# Patient Record
Sex: Female | Born: 1935 | Race: White | Hispanic: No | Smoking: Former smoker
Health system: Southern US, Community
[De-identification: ages and names within clinical notes are randomized; demographics above are authoritative.]

## PROBLEM LIST (undated history)

## (undated) DIAGNOSIS — G8929 Other chronic pain: Secondary | ICD-10-CM

## (undated) DIAGNOSIS — M069 Rheumatoid arthritis, unspecified: Secondary | ICD-10-CM

## (undated) DIAGNOSIS — I951 Orthostatic hypotension: Secondary | ICD-10-CM

## (undated) DIAGNOSIS — I341 Nonrheumatic mitral (valve) prolapse: Secondary | ICD-10-CM

## (undated) DIAGNOSIS — M545 Low back pain, unspecified: Secondary | ICD-10-CM

## (undated) DIAGNOSIS — J449 Chronic obstructive pulmonary disease, unspecified: Secondary | ICD-10-CM

## (undated) DIAGNOSIS — I4891 Unspecified atrial fibrillation: Secondary | ICD-10-CM

## (undated) DIAGNOSIS — C801 Malignant (primary) neoplasm, unspecified: Secondary | ICD-10-CM

## (undated) DIAGNOSIS — R42 Dizziness and giddiness: Secondary | ICD-10-CM

## (undated) DIAGNOSIS — M1990 Unspecified osteoarthritis, unspecified site: Secondary | ICD-10-CM

## (undated) DIAGNOSIS — B029 Zoster without complications: Secondary | ICD-10-CM

## (undated) DIAGNOSIS — E039 Hypothyroidism, unspecified: Secondary | ICD-10-CM

## (undated) DIAGNOSIS — K5790 Diverticulosis of intestine, part unspecified, without perforation or abscess without bleeding: Secondary | ICD-10-CM

## (undated) DIAGNOSIS — K5792 Diverticulitis of intestine, part unspecified, without perforation or abscess without bleeding: Secondary | ICD-10-CM

## (undated) DIAGNOSIS — K219 Gastro-esophageal reflux disease without esophagitis: Secondary | ICD-10-CM

## (undated) DIAGNOSIS — K449 Diaphragmatic hernia without obstruction or gangrene: Secondary | ICD-10-CM

## (undated) HISTORY — DX: Diaphragmatic hernia without obstruction or gangrene: K44.9

## (undated) HISTORY — PX: BACK SURGERY: SHX140

## (undated) HISTORY — DX: Diverticulitis of intestine, part unspecified, without perforation or abscess without bleeding: K57.92

## (undated) HISTORY — PX: TONSILLECTOMY: SUR1361

## (undated) HISTORY — PX: FIXATION KYPHOPLASTY THORACIC SPINE: SHX1643

## (undated) HISTORY — PX: TENDON REPAIR: SHX5111

## (undated) HISTORY — PX: SKIN CANCER EXCISION: SHX779

## (undated) HISTORY — DX: Nonrheumatic mitral (valve) prolapse: I34.1

## (undated) HISTORY — DX: Chronic obstructive pulmonary disease, unspecified: J44.9

---

## 1998-07-02 ENCOUNTER — Encounter: Payer: Self-pay | Admitting: Internal Medicine

## 1998-07-02 ENCOUNTER — Ambulatory Visit (HOSPITAL_COMMUNITY): Admission: RE | Admit: 1998-07-02 | Discharge: 1998-07-02 | Payer: Self-pay | Admitting: Internal Medicine

## 1998-09-19 ENCOUNTER — Other Ambulatory Visit: Admission: RE | Admit: 1998-09-19 | Discharge: 1998-09-19 | Payer: Self-pay | Admitting: Obstetrics and Gynecology

## 1999-07-06 ENCOUNTER — Ambulatory Visit (HOSPITAL_COMMUNITY): Admission: RE | Admit: 1999-07-06 | Discharge: 1999-07-06 | Payer: Self-pay | Admitting: Internal Medicine

## 1999-11-26 ENCOUNTER — Other Ambulatory Visit: Admission: RE | Admit: 1999-11-26 | Discharge: 1999-11-26 | Payer: Self-pay | Admitting: Internal Medicine

## 2000-07-06 ENCOUNTER — Ambulatory Visit (HOSPITAL_COMMUNITY): Admission: RE | Admit: 2000-07-06 | Discharge: 2000-07-06 | Payer: Self-pay | Admitting: Internal Medicine

## 2000-07-06 ENCOUNTER — Encounter: Payer: Self-pay | Admitting: Internal Medicine

## 2000-12-05 ENCOUNTER — Other Ambulatory Visit: Admission: RE | Admit: 2000-12-05 | Discharge: 2000-12-05 | Payer: Self-pay | Admitting: Internal Medicine

## 2001-07-10 ENCOUNTER — Encounter: Payer: Self-pay | Admitting: Internal Medicine

## 2001-07-10 ENCOUNTER — Ambulatory Visit (HOSPITAL_COMMUNITY): Admission: RE | Admit: 2001-07-10 | Discharge: 2001-07-10 | Payer: Self-pay | Admitting: Internal Medicine

## 2001-12-15 ENCOUNTER — Other Ambulatory Visit: Admission: RE | Admit: 2001-12-15 | Discharge: 2001-12-15 | Payer: Self-pay | Admitting: Internal Medicine

## 2009-05-17 ENCOUNTER — Ambulatory Visit: Payer: Self-pay | Admitting: Family Medicine

## 2009-05-17 DIAGNOSIS — R109 Unspecified abdominal pain: Secondary | ICD-10-CM

## 2009-05-17 LAB — CONVERTED CEMR LAB
Bilirubin Urine: NEGATIVE
Glucose, Urine, Semiquant: NEGATIVE
Ketones, urine, test strip: NEGATIVE
Nitrite: NEGATIVE
Protein, U semiquant: NEGATIVE
Specific Gravity, Urine: 1.01
Urobilinogen, UA: 0.2
pH: 7

## 2009-05-20 ENCOUNTER — Ambulatory Visit: Payer: Self-pay | Admitting: Family Medicine

## 2009-05-20 DIAGNOSIS — E039 Hypothyroidism, unspecified: Secondary | ICD-10-CM

## 2009-05-20 LAB — CONVERTED CEMR LAB
Bilirubin Urine: NEGATIVE
Glucose, Urine, Semiquant: NEGATIVE
Nitrite: NEGATIVE
Protein, U semiquant: NEGATIVE
Specific Gravity, Urine: 1.02
Urobilinogen, UA: 0.2
WBC Urine, dipstick: NEGATIVE
pH: 6

## 2009-05-21 ENCOUNTER — Encounter: Payer: Self-pay | Admitting: Family Medicine

## 2009-05-21 LAB — CONVERTED CEMR LAB
Albumin: 4.6 g/dL (ref 3.5–5.2)
Alkaline Phosphatase: 82 units/L (ref 39–117)
BUN: 14 mg/dL (ref 6–23)
Basophils Absolute: 0 10*3/uL (ref 0.0–0.1)
CA 125: 15.6 units/mL (ref 0.0–30.2)
CO2: 25 meq/L (ref 19–32)
Calcium: 10 mg/dL (ref 8.4–10.5)
Chloride: 97 meq/L (ref 96–112)
Eosinophils Relative: 1 % (ref 0–5)
Glucose, Bld: 95 mg/dL (ref 70–99)
HCT: 39.8 % (ref 36.0–46.0)
Hemoglobin: 12.8 g/dL (ref 12.0–15.0)
Lymphocytes Relative: 32 % (ref 12–46)
MCHC: 32.2 g/dL (ref 30.0–36.0)
Monocytes Absolute: 1 10*3/uL (ref 0.1–1.0)
Monocytes Relative: 11 % (ref 3–12)
Potassium: 4.4 meq/L (ref 3.5–5.3)
RDW: 13.8 % (ref 11.5–15.5)
TSH: 2.592 microintl units/mL (ref 0.350–4.500)

## 2009-05-22 ENCOUNTER — Encounter: Admission: RE | Admit: 2009-05-22 | Discharge: 2009-05-22 | Payer: Self-pay | Admitting: Family Medicine

## 2009-05-22 ENCOUNTER — Telehealth: Payer: Self-pay | Admitting: Family Medicine

## 2009-05-22 DIAGNOSIS — M069 Rheumatoid arthritis, unspecified: Secondary | ICD-10-CM

## 2009-06-10 ENCOUNTER — Encounter: Payer: Self-pay | Admitting: Family Medicine

## 2009-06-24 ENCOUNTER — Encounter: Payer: Self-pay | Admitting: Family Medicine

## 2009-07-23 ENCOUNTER — Encounter: Payer: Self-pay | Admitting: Family Medicine

## 2009-09-03 ENCOUNTER — Encounter: Payer: Self-pay | Admitting: Family Medicine

## 2009-11-03 ENCOUNTER — Encounter: Payer: Self-pay | Admitting: Family Medicine

## 2010-04-01 ENCOUNTER — Encounter: Payer: Self-pay | Admitting: Family Medicine

## 2010-06-03 ENCOUNTER — Ambulatory Visit: Payer: Self-pay | Admitting: Family Medicine

## 2010-06-04 ENCOUNTER — Encounter: Payer: Self-pay | Admitting: Family Medicine

## 2010-06-05 ENCOUNTER — Encounter: Payer: Self-pay | Admitting: Family Medicine

## 2010-06-09 ENCOUNTER — Telehealth (INDEPENDENT_AMBULATORY_CARE_PROVIDER_SITE_OTHER): Payer: Self-pay | Admitting: *Deleted

## 2010-06-09 ENCOUNTER — Encounter: Payer: Self-pay | Admitting: Family Medicine

## 2010-06-09 LAB — CONVERTED CEMR LAB
ALT: 19 units/L (ref 0–35)
Albumin: 4.2 g/dL (ref 3.5–5.2)
CO2: 25 meq/L (ref 19–32)
Chloride: 98 meq/L (ref 96–112)
Cholesterol: 236 mg/dL — ABNORMAL HIGH (ref 0–200)
Folate: 17.2 ng/mL
Glucose, Bld: 87 mg/dL (ref 70–99)
Platelets: 333 10*3/uL (ref 150–400)
Potassium: 4.2 meq/L (ref 3.5–5.3)
RBC: 3.79 M/uL — ABNORMAL LOW (ref 3.87–5.11)
Sodium: 137 meq/L (ref 135–145)
Total Protein: 6.8 g/dL (ref 6.0–8.3)
Triglycerides: 122 mg/dL (ref <150)
Vitamin B-12: 476 pg/mL (ref 211–911)
WBC: 11 10*3/uL — ABNORMAL HIGH (ref 4.0–10.5)

## 2010-06-11 ENCOUNTER — Ambulatory Visit: Payer: Self-pay | Admitting: Family Medicine

## 2010-06-11 DIAGNOSIS — C4490 Unspecified malignant neoplasm of skin, unspecified: Secondary | ICD-10-CM | POA: Insufficient documentation

## 2010-06-17 ENCOUNTER — Encounter: Payer: Self-pay | Admitting: Family Medicine

## 2010-06-18 ENCOUNTER — Ambulatory Visit: Payer: Self-pay | Admitting: Family Medicine

## 2010-06-19 ENCOUNTER — Encounter: Payer: Self-pay | Admitting: Family Medicine

## 2010-06-25 ENCOUNTER — Ambulatory Visit: Payer: Self-pay | Admitting: Family Medicine

## 2010-07-02 ENCOUNTER — Ambulatory Visit: Payer: Self-pay | Admitting: Family Medicine

## 2010-07-09 ENCOUNTER — Ambulatory Visit: Payer: Self-pay | Admitting: Family Medicine

## 2010-07-16 ENCOUNTER — Ambulatory Visit: Payer: Self-pay | Admitting: Family Medicine

## 2010-07-23 ENCOUNTER — Ambulatory Visit: Payer: Self-pay | Admitting: Family Medicine

## 2010-07-29 ENCOUNTER — Encounter: Payer: Self-pay | Admitting: Family Medicine

## 2010-08-06 ENCOUNTER — Ambulatory Visit: Payer: Self-pay | Admitting: Family Medicine

## 2010-08-06 ENCOUNTER — Telehealth: Payer: Self-pay | Admitting: Family Medicine

## 2010-08-13 ENCOUNTER — Ambulatory Visit: Payer: Self-pay | Admitting: Family Medicine

## 2010-08-20 ENCOUNTER — Ambulatory Visit: Payer: Self-pay | Admitting: Family Medicine

## 2010-08-27 ENCOUNTER — Ambulatory Visit: Payer: Self-pay | Admitting: Family Medicine

## 2010-09-03 ENCOUNTER — Ambulatory Visit: Payer: Self-pay | Admitting: Family Medicine

## 2010-09-10 ENCOUNTER — Ambulatory Visit
Admission: RE | Admit: 2010-09-10 | Discharge: 2010-09-10 | Payer: Self-pay | Source: Home / Self Care | Attending: Family Medicine | Admitting: Family Medicine

## 2010-09-17 ENCOUNTER — Ambulatory Visit
Admission: RE | Admit: 2010-09-17 | Discharge: 2010-09-17 | Payer: Self-pay | Source: Home / Self Care | Attending: Family Medicine | Admitting: Family Medicine

## 2010-09-24 ENCOUNTER — Ambulatory Visit
Admission: RE | Admit: 2010-09-24 | Discharge: 2010-09-24 | Payer: Self-pay | Source: Home / Self Care | Attending: Family Medicine | Admitting: Family Medicine

## 2010-10-01 ENCOUNTER — Ambulatory Visit
Admission: RE | Admit: 2010-10-01 | Discharge: 2010-10-01 | Payer: Self-pay | Source: Home / Self Care | Attending: Family Medicine | Admitting: Family Medicine

## 2010-10-08 ENCOUNTER — Ambulatory Visit: Payer: 59

## 2010-10-08 NOTE — Progress Notes (Signed)
----   Converted from flag ---- ---- 06/08/2010 5:11 PM, Nani Gasser MD wrote: Call lab and see if they can add a B12 and folate level. ------------------------------  06/08/10 called  lab and added the additional test.acm

## 2010-10-08 NOTE — Assessment & Plan Note (Signed)
Summary: nurse  Nurse Visit   Allergies: No Known Drug Allergies  Medication Administration  Injection # 1:    Medication: methotrexate    Diagnosis: RHEUMATOID ARTHRITIS (ICD-714.0)    Route: SQ    Site: left arm    Patient tolerated injection without complications    Given by: Avon Gully CMA, Duncan Dull) (August 13, 2010 11:26 AM)  Orders Added: 1)  Admin of patients own med IM/SQ [phone]

## 2010-10-08 NOTE — Assessment & Plan Note (Signed)
Summary: Nurse  Nurse Visit   Allergies: No Known Drug Allergies  Immunization History:  Influenza Immunization History:    Influenza:  historical (06/30/2010)  Medication Administration  Injection # 1:    Medication: Methotrexate    Diagnosis: RHEUMATOID ARTHRITIS (ICD-714.0)    Route: SQ    Site: Rarm    Comments: .6cc given in rt arm. pt brings her own meds    Patient tolerated injection without complications    Given by: Avon Gully CMA, Duncan Dull) (July 02, 2010 10:06 AM)  Orders Added: 1)  Admin of patients own med IM/SQ [phone]

## 2010-10-08 NOTE — Assessment & Plan Note (Signed)
Summary: Injection - jr  Nurse Visit   Allergies: No Known Drug Allergies  Medication Administration  Injection # 1:    Medication: methotrexate    Diagnosis: RHEUMATOID ARTHRITIS (ICD-714.0)    Route: IM    Site: left arm    Patient tolerated injection without complications    Given by: Avon Gully CMA, Duncan Dull) (September 24, 2010 1:10 PM)  Orders Added: 1)  Admin of patients own med IM/SQ [phone]

## 2010-10-08 NOTE — Assessment & Plan Note (Signed)
Summary: nurse  Nurse Visit   Allergies: No Known Drug Allergies  Medication Administration  Injection # 1:    Medication: methotrexate    Route: SQ    Patient tolerated injection without complications    Given by: Avon Gully CMA, Duncan Dull) (September 17, 2010 5:13 PM)  Orders Added: 1)  Admin of patients own med IM/SQ [phone]

## 2010-10-08 NOTE — Assessment & Plan Note (Signed)
Summary: SHOT MTX  Nurse Visit   Allergies: No Known Drug Allergies  Medication Administration  Injection # 1:    Medication: methotrexate    Diagnosis: RHEUMATOID ARTHRITIS (ICD-714.0)    Route: SQ    Site: rt arm    Comments: .6cc given    Patient tolerated injection without complications    Given by: Avon Gully CMA, Duncan Dull) (July 23, 2010 10:28 AM)  Orders Added: 1)  Admin of patients own med IM/SQ [phone]

## 2010-10-08 NOTE — Assessment & Plan Note (Signed)
Summary: biopsy leg, MTX injection   Vital Signs:  Patient profile:   77 year old female Height:      64 inches Weight:      104 pounds Pulse rate:   88 / minute BP sitting:   127 / 58  (left arm) Cuff size:   regular  Vitals Entered By: Avon Gully CMA, Duncan Dull) (June 11, 2010 1:10 PM) CC: BX on the leg   Primary Care Provider:  Nani Gasser, MD  CC:  BX on the leg.  History of Present Illness: Lesion has been there for years.    Current Medications (verified): 1)  Levothyroxine Sodium 50 Mcg Tabs (Levothyroxine Sodium) .... Take 1 Tablet By Mouth Once A Day 2)  Fentanyl 12 Mcg/hr Pt72 (Fentanyl) .... Every 72 Hours 3)  Tylenol 500mg  .... 1000mg  4 Times A Day 4)  Alendronate Sodium 70 Mg Tabs (Alendronate Sodium) .... Take One Tablet Once A Week 5)  Folic Acid 1 Mg Tabs (Folic Acid) .... Take One Tablet By Mouth Not On The Same Day As The Methotrexate 6)  Prednisone 5 Mg Tabs (Prednisone) .... Take One Tablet By Mouth Once A Day 7)  Methotrexate 2.5 Mg Tabs (Methotrexate Sodium) .... 6 Tablets  At The Same Time Once A Week 8)  Famotidine 20 Mg Tabs (Famotidine) .... Take One Tablet By Mouth Once A Day 9)  Alendronate Sodium 70 Mg Tabs (Alendronate Sodium) .... One By Mouth Once A Week  Allergies (verified): No Known Drug Allergies  Comments:  Nurse/Medical Assistant: The patient's medications and allergies were reviewed with the patient and were updated in the Medication and Allergy Lists. Avon Gully CMA, Duncan Dull) (June 11, 2010 1:15 PM)  Physical Exam  General:  Well-developed,well-nourished,in no acute distress; alert,appropriate and cooperative throughout examination   Impression & Recommendations:  Problem # 1:  RHEUMATOID ARTHRITIS (ICD-714.0)  Spoke with Dr. Dareen Piano ( her rheum) and we will start the MTX injections weekly here in our office since she is unable to give them to herself.  Her updated medication list for this problem  includes:    Prednisone 5 Mg Tabs (Prednisone) .[name]... Take one tablet by mouth once a day    Methotrexate 2.5 Mg Tabs (Methotrexate sodium) .Marland KitchenMarland KitchenMarland KitchenMarland Kitchen 6 tablets  at the same time once a week    Methotrexate Sodium 50 Mg/85ml Soln (Methotrexate sodium) ..... Inject 0.19ml [place] once a week.  Orders: Admin of patients own med IM/SQ ([id number])  Problem # 2:  CARCINOMA, BASAL CELL (ICD-173.9)  Bx sent for confirmation. F/U wound care reviewed.   Orders: Shave Skin Lesion 0.6-1.0 cm/trunk/arm/leg (11301)  Complete Medication List: 1)  Levothyroxine Sodium 50 Mcg Tabs (Levothyroxine sodium) .... Take 1 tablet by mouth once a day 2)  Fentanyl 12 Mcg/hr Pt72 (Fentanyl) .... Every 72 hours 3)  Tylenol 500mg   .... 1000mg  4 times a day 4)  Alendronate Sodium 70 Mg Tabs (Alendronate sodium) .... Take one tablet once a week 5)  Folic Acid 1 Mg Tabs (Folic acid) .... Take one tablet by mouth not on the same day as the methotrexate 6)  Prednisone 5 Mg Tabs (Prednisone) .... Take one tablet by mouth once a day 7)  Methotrexate 2.5 Mg Tabs (Methotrexate sodium) .... 6 tablets  at the same time once a week 8)  Famotidine 20 Mg Tabs (Famotidine) .... Take one tablet by mouth once a day 9)  Alendronate Sodium 70 Mg Tabs (Alendronate sodium) .... One by mouth once a  week 10)  Methotrexate Sodium 50 Mg/34ml Soln (Methotrexate sodium) .... Inject 0.13ml [place] once a week.  Patient Instructions: 1)  F/U in one week to see the nurse for the methotrexate injection. Stop the oral tabs.   2)  For the wound. Can wash in the shower. Avoid peroxide or alcohol. Apply vaseline daily and cover with bandaid until healed.   Procedure Note  Mole Biopsy/Removal: Onset of lesion: years  Indication: suspicious lesion  Procedure # 1: shave biopsy    Size (in cm): 0.9 x 0.9    Region: Left anterior thigh    Comment: Aluminum chloride used to acheive hemostasis.     Anesthesia: 1% lidocaine w/epinephrine  Cleaned and prepped with:  alcohol, sterile H20, and betadine Wound dressing: bacitracin and bandaid  Biopsy: Biopsy Type: Skin  Procedure # 1: shave biopsy    Location: left thigh    Comment: lot # [id number]  exp 11/12    Instrument used: 3mm punch    Anesthesia: 2.0 ml 1% lidocaine w/epinephrine     Medication Administration  Injection # 1:    Medication: methotrexate    Diagnosis: RHEUMATOID ARTHRITIS (ICD-714.0)    Route: SQ    Site: left arm    Comments: pt brough her own medication    Patient tolerated injection without complications    Given by: Avon Gully CMA, Duncan Dull) (June 11, 2010 1:47 PM)  Orders Added: 1)  Admin of patients own med IM/SQ [[id number]] 2)  Shave Skin Lesion 0.6-1.0 cm/trunk/arm/leg [11301]

## 2010-10-08 NOTE — Assessment & Plan Note (Signed)
Summary: injection-jr  Nurse Visit   Allergies: No Known Drug Allergies  Medication Administration  Injection # 1:    Medication: Methotrezate    Diagnosis: RHEUMATOID ARTHRITIS (ICD-714.0)    Comments: Pt bringes her own meds    Patient tolerated injection without complications    Given by: Avon Gully CMA, Duncan Dull) (June 25, 2010 10:39 AM)  Orders Added: 1)  Admin of patients own med IM/SQ [phone]

## 2010-10-08 NOTE — Assessment & Plan Note (Signed)
Summary: [facility] Annual Wellness   Vital Signs:  Patient profile:   77 year old female Height:      64 inches Weight:      103 pounds BMI:     17.74 Pulse rate:   69 / minute BP sitting:   122 / 61  (left arm) Cuff size:   regular  Vitals Entered By: Avon Gully CMA, Duncan Dull) (June 03, 2010 9:47 AM) CC: annual wellness   Primary Care Provider:  Nani Gasser, MD  CC:  annual wellness.  History of Present Illness: See scanned form.   Current Medications (verified): 1)  Levothroid 25 Mcg Tabs (Levothyroxine Sodium) .... Take One Tablet By Mouth Once A Day 2)  Fentanyl 12 Mcg/hr Pt72 (Fentanyl) .... Every 72 Hours 3)  Tylenol 500mg  .... 1000mg  4 Times A Day 4)  Alendronate Sodium 70 Mg Tabs (Alendronate Sodium) .... Take One Tablet Once A Week 5)  Folic Acid 1 Mg Tabs (Folic Acid) .... Take One Tablet By Mouth Not On The Same Day As The Methotrexate 6)  Prednisone 5 Mg Tabs (Prednisone) .... Take One Tablet By Mouth Once A Day 7)  Methotrexate 2.5 Mg Tabs (Methotrexate Sodium) .... 6 Tablets  At The Same Time Once A Week 8)  Famotidine 20 Mg Tabs (Famotidine) .... Take One Tablet By Mouth Once A Day  Allergies (verified): No Known Drug Allergies  Comments:  Nurse/Medical Assistant: The patient's medications and allergies were reviewed with the patient and were updated in the Medication and Allergy Lists. Avon Gully CMA, Duncan Dull) (June 03, 2010 9:52 AM)  Contraindications/Deferment of Procedures/Staging:    Test/Procedure: FLU VAX    Reason for deferment: patient declined   Past History:  Past Medical History: arthritis mitral valve prolapse hiatal hernia diverticulitis copd Dr. Dareen Piano- rhuematology  Physical Exam  General:  Well-developed,well-nourished,in no acute distress; alert,appropriate and cooperative throughout examination. thin frail appearing female. Head:  Normocephalic and atraumatic without obvious abnormalities. No apparent  alopecia or balding. Eyes:  No corneal or conjunctival inflammation noted. EOMI. Perrla. Ears:  External ear exam shows no significant lesions or deformities.  Otoscopic examination reveals clear canals, tympanic membranes are intact bilaterally without bulging, retraction, inflammation or discharge. Hearing is grossly normal bilaterally. Nose:  External nasal examination shows no deformity or inflammation.  Mouth:  Oral mucosa and oropharynx without lesions or exudates.  Teeth in good repair. Neck:  No deformities, masses, or tenderness noted. Chest Wall:  No deformities, masses, or tenderness noted. Breasts:  No mass, nodules, thickening, tenderness, bulging, retraction, inflamation, nipple discharge or skin changes noted.   Lungs:  Normal respiratory effort, chest expands symmetrically. Lungs are clear to auscultation, no crackles or wheezes. Heart:  Normal rate and regular rhythm. S1 and S2 normal without gallop, murmur, click, rub or other extra sounds. Abdomen:  Bowel sounds positive,abdomen soft and non-tender without masses, organomegaly or hernias noted. Msk:  deformed hand joints.  Pulses:  R and L carotid,radial,dorsalis pedis and posterior tibial pulses are full and equal bilaterally Extremities:  No clubbing, cyanosis, edema, or deformity noted with normal full range of motion of all joints.   Neurologic:  No cranial nerve deficits noted. Station and gait are normal. DTRs are symmetrical throughout. Sensory, motor and coordinative functions appear intact. Skin:  no rashes.   Cervical Nodes:  No lymphadenopathy noted Axillary Nodes:  No palpable lymphadenopathy Psych:  Cognition and judgment appear intact. Alert and cooperative with normal attention span and concentration. No apparent delusions, illusions,  hallucinations   Impression & Recommendations:  Problem # 1:  HEALTH MAINTENANCE EXAM (ICD-V70.0)  Orders: [facility] -Subsequent Annual Wellness Visit [phone])  Complete Medication  List: 1)  Levothroid 25 Mcg Tabs (Levothyroxine sodium) .... Take one tablet by mouth once a day 2)  Fentanyl 12 Mcg/hr Pt72 (Fentanyl) .... Every 72 hours 3)  Tylenol 500mg   .... 1000mg  4 times a day 4)  Alendronate Sodium 70 Mg Tabs (Alendronate sodium) .... Take one tablet once a week 5)  Folic Acid 1 Mg Tabs (Folic acid) .... Take one tablet by mouth not on the same day as the methotrexate 6)  Prednisone 5 Mg Tabs (Prednisone) .... Take one tablet by mouth once a day 7)  Methotrexate 2.5 Mg Tabs (Methotrexate sodium) .... 6 tablets  at the same time once a week 8)  Famotidine 20 Mg Tabs (Famotidine) .... Take one tablet by mouth once a day 9)  Alendronate Sodium 70 Mg Tabs (Alendronate sodium) .... One by mouth once a week  Other Orders: T-TSH [phone]) T-Comprehensive Metabolic Panel [phone]) T-Lipid Profile [phone]) T-CBC No Diff ([id number])  Patient Instructions: 1)  Make sure to get your mammogram this fall 2)  Let me know when you get your flu shot this year. 3)  We will call you with your lab results.   Herpes [place]oster Result Date:  02/04/2010 Herpes Zoster Result:  given Flex Sig Next Due:  Not Indicated Colonoscopy Next Due:  Not Indicated Hemoccult Next Due:  Not Indicated PAP Next Due:  Not Indicated Mammogram Next Due:  Not Indicated

## 2010-10-08 NOTE — Consult Note (Signed)
Summary: [facility]   Imported By: Lanelle Bal 10/01/2010 09:22:21  _____________________________________________________________________  External Attachment:    Type:   Image     Comment:   External Document

## 2010-10-08 NOTE — Letter (Signed)
Summary: [facility]   Imported By: Maryln Gottron 08/13/2010 15:23:24  _____________________________________________________________________  External Attachment:    Type:   Image     Comment:   External Document

## 2010-10-08 NOTE — Assessment & Plan Note (Signed)
Summary: METHOTREXATE INJ  Nurse Visit   Allergies: No Known Drug Allergies  Medication Administration  Medication # 1:    Medication: Methotrexate    Diagnosis: RHEUMATOID ARTHRITIS (ICD-714.0)    Comments: .6cc given in rt arm subq;pt brough her own meds    Patient tolerated medication without complications    Given by: Avon Gully CMA, Duncan Dull) (June 18, 2010 11:04 AM)  Orders Added: 1)  Admin of patients own med IM/SQ [phone]

## 2010-10-08 NOTE — Letter (Signed)
Summary: Cardiovascular Screening/[facility]  Cardiovascular Screening/[facility]   Imported By: Lanelle Bal 06/24/2010 14:17:14  _____________________________________________________________________  External Attachment:    Type:   Image     Comment:   External Document

## 2010-10-08 NOTE — Assessment & Plan Note (Signed)
Summary: nursE  Nurse Visit   Vitals Entered By: Avon Gully CMA, Duncan Dull) (August 20, 2010 11:39 AM)  Allergies: No Known Drug Allergies  Medication Administration  Injection # 1:    Medication: methotrexate    Diagnosis: RHEUMATOID ARTHRITIS (ICD-714.0)    Route: IM    Site: left arm    Patient tolerated injection without complications    Given by: Avon Gully CMA, Duncan Dull) (August 20, 2010 11:40 AM)  Orders Added: 1)  Admin of patients own med IM/SQ [phone]

## 2010-10-08 NOTE — Letter (Signed)
Summary: [facility]   Imported By: Lanelle Bal 11/25/2009 10:10:31  _____________________________________________________________________  External Attachment:    Type:   Image     Comment:   External Document  Appended Document: [facility]

## 2010-10-08 NOTE — Assessment & Plan Note (Signed)
Summary: nurse- jr  Nurse Visit   Allergies: No Known Drug Allergies  Medication Administration  Injection # 1:    Medication: methotrexate    Diagnosis: RHEUMATOID ARTHRITIS (ICD-714.0)    Route: SQ    Site: left arm    Patient tolerated injection without complications    Given by: Avon Gully CMA, Duncan Dull) (September 03, 2010 2:11 PM)  Orders Added: 1)  Admin of patients own med IM/SQ [phone]

## 2010-10-08 NOTE — Assessment & Plan Note (Signed)
Summary: NURSE VISIT-VEW  Nurse Visit   Allergies: No Known Drug Allergies  Medication Administration  Injection # 1:    Medication: Methotrexate    Diagnosis: RHEUMATOID ARTHRITIS (ICD-714.0)    Route: SQ    Site: left arm    Comments: .6 cc given; pt brings her own medication    Patient tolerated injection without complications    Given by: Avon Gully CMA, Duncan Dull) (July 09, 2010 10:05 AM)  Orders Added: 1)  Admin of patients own med IM/SQ [phone]

## 2010-10-08 NOTE — Assessment & Plan Note (Signed)
Summary: Nurse- jr  Nurse Visit   Allergies: No Known Drug Allergies  Medication Administration  Injection # 1:    Medication: Methotrexate    Diagnosis: RHEUMATOID ARTHRITIS (ICD-714.0)    Route: SQ    Site: left arm    Comments: o.51ml injected     Patient tolerated injection without complications    Given by: Kathlene November LPN (August 27, 2010 2:05 PM)  Orders Added: 1)  Admin of patients own med IM/SQ [phone]

## 2010-10-08 NOTE — Assessment & Plan Note (Signed)
Summary: injection  Nurse Visit   Allergies: No Known Drug Allergies  Medication Administration  Injection # 1:    Medication: Methotrexate    Diagnosis: RHEUMATOID ARTHRITIS (ICD-714.0)    Route: SQ    Site: rt arm    Comments: .6 cc given    Patient tolerated injection without complications    Given by: Avon Gully CMA, Duncan Dull) (July 16, 2010 1:53 PM)  Orders Added: 1)  Admin of patients own med IM/SQ [phone]

## 2010-10-08 NOTE — Miscellaneous (Signed)
Summary: mammogram  Clinical Lists Changes  Observations: Added new observation of MAMMOGRAM: Normal (06/19/2010 10:35) Added new observation of MAMMO DUE: 06/2011 (06/19/2010 10:35)    Normal Mammogram. Return for follow up Mammogram in 1 yearCall pt: Normal Mammogram. Return for follow up Mammogram in 1 year October 14, 201110:37 AM [place] MD, Gae Bon CMA, Duncan Dull), Andrea 10:41 AM June 19, 2010 left message for pt on vm

## 2010-10-08 NOTE — Letter (Signed)
Summary: [facility]   Imported By: Lanelle Bal 09/12/2009 09:23:57  _____________________________________________________________________  External Attachment:    Type:   Image     Comment:   External Document

## 2010-10-08 NOTE — Letter (Signed)
Summary: [facility]   Imported By: Lanelle Bal 06/19/2010 11:57:18  _____________________________________________________________________  External Attachment:    Type:   Image     Comment:   External Document

## 2010-10-08 NOTE — Assessment & Plan Note (Signed)
Summary: NURSE VISIT  Nurse Visit   Primary Care Belicia Difatta:  Nani Gasser, MD   History of Present Illness: patient is here today because she is confused about her medications.  She thought she was started on a cholesterol medication and in fact we had adjusted her dose of her thyroid medication.  She thought she was coming back in to have her cholesterol rechecked.  It turns out she has been taking Synthroid 25 micrograms in the morning appropriately and the new medication for levothyroxine 50 micrograms around 5 p.m. in the evening.   Impression & Recommendations:  Problem # 1:  UNSPECIFIED HYPOTHYROIDISM (ICD-244.9) we discussed to stop the evening dose.  And stopped the morning Synthroid.  To start using levothyroxine 50 micrograms once in the morning only.  She does have slightly elevated cholesterol but at 86 and with her level we will avoid statins at this point.  I asked that she take the 50-mcg dose for about 3 to 4 weeks before we recheck her level.  If we were to recheck her level today he will clearly be abnormal as she's been taking a total of 75 micrograms daily. Her updated medication list for this problem includes:    Levothyroxine Sodium 50 Mcg Tabs (Levothyroxine sodium) .[name]... Take 1 tablet by mouth once a day  Complete Medication List: 1)  Levothyroxine Sodium 50 Mcg Tabs (Levothyroxine sodium) .... Take 1 tablet by mouth once a day 2)  Fentanyl 12 Mcg/hr Pt72 (Fentanyl) .... Every 72 hours 3)  Tylenol 500mg   .... 1000mg  4 times a day 4)  Alendronate Sodium 70 Mg Tabs (Alendronate sodium) .... Take one tablet once a week 5)  Folic Acid 1 Mg Tabs (Folic acid) .... Take one tablet by mouth not on the same day as the methotrexate 6)  Prednisone 5 Mg Tabs (Prednisone) .... Take one tablet by mouth once a day 7)  Famotidine 20 Mg Tabs (Famotidine) .... Take one tablet by mouth once a day 8)  Alendronate Sodium 70 Mg Tabs (Alendronate sodium) .... One by mouth once a  week 9)  Methotrexate Sodium 50 Mg/42ml Soln (Methotrexate sodium) .... Inject 0.61ml [place] once a week.  Other Orders: Admin of patients own med IM/SQ [phone])   Patient Instructions: 1)  Only take the thyroid medicine in the morning (levothyroxine) about 1 hour before breakfast on an empty stomach.    Allergies: No Known Drug Allergies  Medication Administration  Injection # 1:    Medication: methotrexate    Diagnosis: RHEUMATOID ARTHRITIS (ICD-714.0)    Route: SQ    Site: left arm    Comments: .6cc given    Patient tolerated injection without complications    Given by: Avon Gully CMA, Duncan Dull) (August 06, 2010 12:00 PM)  Orders Added: 1)  Admin of patients own med IM/SQ [[id number]] 2)  Est. Patient Level I [id number]

## 2010-10-08 NOTE — Assessment & Plan Note (Signed)
Summary: Injection   Nurse Visit   Allergies: No Known Drug Allergies  Medication Administration  Injection # 1:    Medication: methotrexate    Diagnosis: RHEUMATOID ARTHRITIS (ICD-714.0)    Route: SQ    Site: left arm    Patient tolerated injection without complications    Given by: Avon Gully CMA, Duncan Dull) (September 10, 2010 11:24 AM)  Orders Added: 1)  Admin of patients own med IM/SQ [phone]

## 2010-10-14 NOTE — Assessment & Plan Note (Signed)
Summary: Injection  Nurse Visit   Allergies: No Known Drug Allergies  Medication Administration  Injection # 1:    Medication: methotrexate    Diagnosis: RHEUMATOID ARTHRITIS (ICD-714.0)    Route: SQ    Site: rt arm    Patient tolerated injection without complications    Given by: Avon Gully CMA, Duncan Dull) (October 01, 2010 1:04 PM)  Orders Added: 1)  Admin of patients own med IM/SQ [phone]

## 2010-10-15 ENCOUNTER — Encounter: Payer: Self-pay | Admitting: Family Medicine

## 2010-10-15 ENCOUNTER — Ambulatory Visit (INDEPENDENT_AMBULATORY_CARE_PROVIDER_SITE_OTHER): Payer: Medicare Other

## 2010-10-15 DIAGNOSIS — M069 Rheumatoid arthritis, unspecified: Secondary | ICD-10-CM

## 2010-10-22 ENCOUNTER — Encounter: Payer: Self-pay | Admitting: Family Medicine

## 2010-10-22 ENCOUNTER — Ambulatory Visit (INDEPENDENT_AMBULATORY_CARE_PROVIDER_SITE_OTHER): Payer: Medicare Other

## 2010-10-22 DIAGNOSIS — M069 Rheumatoid arthritis, unspecified: Secondary | ICD-10-CM

## 2010-10-22 NOTE — Assessment & Plan Note (Signed)
Summary: nurse - jr  Nurse Visit   Allergies: No Known Drug Allergies  Medication Administration  Injection # 1:    Medication: methotrexate    Diagnosis: RHEUMATOID ARTHRITIS (ICD-714.0)    Route: IM    Site: left arm    Patient tolerated injection without complications    Given by: Avon Gully CMA, Duncan Dull) (October 15, 2010 11:16 AM)  Orders Added: 1)  Admin of patients own med IM/SQ [phone]

## 2010-10-28 NOTE — Assessment & Plan Note (Signed)
Summary: injection   Allergies: No Known Drug Allergies   Complete Medication List: 1)  Levothyroxine Sodium 50 Mcg Tabs (Levothyroxine sodium) .... Take 1 tablet by mouth once a day 2)  Fentanyl 12 Mcg/hr Pt72 (Fentanyl) .... Every 72 hours 3)  Tylenol 500mg   .... 1000mg  4 times a day 4)  Alendronate Sodium 70 Mg Tabs (Alendronate sodium) .... Take one tablet once a week 5)  Folic Acid 1 Mg Tabs (Folic acid) .... Take one tablet by mouth not on the same day as the methotrexate 6)  Prednisone 5 Mg Tabs (Prednisone) .... Take one tablet by mouth once a day 7)  Famotidine 20 Mg Tabs (Famotidine) .... Take one tablet by mouth once a day 8)  Alendronate Sodium 70 Mg Tabs (Alendronate sodium) .... One by mouth once a week 9)  Methotrexate Sodium 50 Mg/34ml Soln (Methotrexate sodium) .... Inject 0.58ml [place] once a week.  Other Orders: Admin of patients own med IM/SQ ([id number])   Medication Administration  Injection # 1:    Medication: methotrexate    Diagnosis: RHEUMATOID ARTHRITIS (ICD-714.0)    Route: SQ    Site: rt arm    Patient tolerated injection without complications    Given by: Avon Gully CMA, Duncan Dull) (October 22, 2010 11:13 AM)  Orders Added: 1)  Admin of patients own med IM/SQ [phone]

## 2010-10-29 ENCOUNTER — Encounter: Payer: Self-pay | Admitting: Family Medicine

## 2010-10-29 ENCOUNTER — Ambulatory Visit (INDEPENDENT_AMBULATORY_CARE_PROVIDER_SITE_OTHER): Payer: Medicare Other

## 2010-10-29 DIAGNOSIS — M069 Rheumatoid arthritis, unspecified: Secondary | ICD-10-CM

## 2010-11-03 NOTE — Assessment & Plan Note (Signed)
Summary: nurse- jr  Nurse Visit   Allergies: No Known Drug Allergies  Medication Administration  Injection # 1:    Medication: Methotrexate    Route: SQ    Site: left arm    Patient tolerated injection without complications    Given by: Avon Gully CMA, Duncan Dull) (October 29, 2010 12:00 PM)  Orders Added: 1)  Admin of patients own med IM/SQ [phone]

## 2010-11-05 ENCOUNTER — Encounter: Payer: Self-pay | Admitting: Family Medicine

## 2010-11-05 ENCOUNTER — Ambulatory Visit (INDEPENDENT_AMBULATORY_CARE_PROVIDER_SITE_OTHER): Payer: Medicare Other

## 2010-11-05 DIAGNOSIS — Z0000 Encounter for general adult medical examination without abnormal findings: Secondary | ICD-10-CM

## 2010-11-12 ENCOUNTER — Ambulatory Visit (INDEPENDENT_AMBULATORY_CARE_PROVIDER_SITE_OTHER): Payer: Medicare Other

## 2010-11-12 ENCOUNTER — Encounter: Payer: Self-pay | Admitting: Family Medicine

## 2010-11-12 DIAGNOSIS — M069 Rheumatoid arthritis, unspecified: Secondary | ICD-10-CM

## 2010-11-12 NOTE — Assessment & Plan Note (Signed)
Summary: shot-vew  Nurse Visit   Allergies: No Known Drug Allergies  Medication Administration  Injection # 1:    Medication: methotrexate    Diagnosis: RHEUMATOID ARTHRITIS (ICD-714.0)    Route: IM    Patient tolerated injection without complications    Given by: Avon Gully CMA, Duncan Dull) (November 05, 2010 11:57 AM)  Orders Added: 1)  Admin of patients own med IM/SQ [phone]

## 2010-11-17 NOTE — Assessment & Plan Note (Signed)
Summary: injection   Allergies: No Known Drug Allergies   Complete Medication List: 1)  Levothyroxine Sodium 50 Mcg Tabs (Levothyroxine sodium) .... Take 1 tablet by mouth once a day 2)  Fentanyl 12 Mcg/hr Pt72 (Fentanyl) .... Every 72 hours 3)  Tylenol 500mg   .... 1000mg  4 times a day 4)  Alendronate Sodium 70 Mg Tabs (Alendronate sodium) .... Take one tablet once a week 5)  Folic Acid 1 Mg Tabs (Folic acid) .... Take one tablet by mouth not on the same day as the methotrexate 6)  Prednisone 5 Mg Tabs (Prednisone) .... Take one tablet by mouth once a day 7)  Famotidine 20 Mg Tabs (Famotidine) .... Take one tablet by mouth once a day 8)  Alendronate Sodium 70 Mg Tabs (Alendronate sodium) .... One by mouth once a week 9)  Methotrexate Sodium 50 Mg/82ml Soln (Methotrexate sodium) .... Inject 0.55ml Shellman once a week.  Other Orders: Admin of patients own med IM/SQ ([id number])   Medication Administration  Injection # 1:    Medication: methotrexate    Diagnosis: RHEUMATOID ARTHRITIS (ICD-714.0)    Route: SQ    Site: rt arm    Patient tolerated injection without complications    Given by: Avon Gully CMA, Duncan Dull) (November 12, 2010 11:35 AM)  Orders Added: 1)  Admin of patients own med IM/SQ [phone]

## 2010-11-19 ENCOUNTER — Ambulatory Visit (INDEPENDENT_AMBULATORY_CARE_PROVIDER_SITE_OTHER): Payer: Medicare Other

## 2010-11-19 ENCOUNTER — Encounter: Payer: Self-pay | Admitting: Family Medicine

## 2010-11-19 DIAGNOSIS — M069 Rheumatoid arthritis, unspecified: Secondary | ICD-10-CM

## 2010-11-24 NOTE — Assessment & Plan Note (Signed)
Summary: nurse visit  Nurse Visit   Allergies: No Known Drug Allergies  Medication Administration  Injection # 1:    Medication: methotrexate    Diagnosis: RHEUMATOID ARTHRITIS (ICD-714.0)    Route: SQ    Site: left arm    Patient tolerated injection without complications    Given by: Avon Gully CMA, Duncan Dull) (November 19, 2010 11:30 AM)  Orders Added: 1)  Admin of patients own med IM/SQ [phone]

## 2010-11-26 ENCOUNTER — Ambulatory Visit: Payer: Medicare Other

## 2010-11-26 ENCOUNTER — Other Ambulatory Visit (INDEPENDENT_AMBULATORY_CARE_PROVIDER_SITE_OTHER): Payer: Medicare Other | Admitting: Family Medicine

## 2010-11-26 DIAGNOSIS — E039 Hypothyroidism, unspecified: Secondary | ICD-10-CM

## 2010-11-26 NOTE — Telephone Encounter (Signed)
Pt states she feels her synthroid is too high. Pt states she doesn't feel well and her heart is racing.Pt states she is taking 50 mcg of Synthroid and felt better on 25 mcg. Please advise

## 2010-11-27 NOTE — Telephone Encounter (Signed)
OK will drop back to . Then recheck thyroid in 2-3 moths. Let me know what pharm.

## 2010-11-30 ENCOUNTER — Telehealth: Payer: Self-pay | Admitting: Family Medicine

## 2010-11-30 NOTE — Telephone Encounter (Signed)
Patient called and wants to know if Dr. Linford Arnold would lower her Thyroid medicine to 25mg  instead of the 50mg  of Levothroxine and call it into [facility] in [facility] so they will deliver it to her? [facility] Phone number is [phone]... Call patient back at [phone] and let her know the status on this please.Lauren KitchenMarland KitchenThanks, Michaelle Copas

## 2010-12-01 NOTE — Telephone Encounter (Signed)
Called pt and left vm with dr. Linford Arnold reccomendations and to call and let us know what pharm

## 2010-12-03 ENCOUNTER — Ambulatory Visit (INDEPENDENT_AMBULATORY_CARE_PROVIDER_SITE_OTHER): Payer: Medicare Other | Admitting: Family Medicine

## 2010-12-03 DIAGNOSIS — M069 Rheumatoid arthritis, unspecified: Secondary | ICD-10-CM

## 2010-12-03 MED ORDER — LEVOTHYROXINE SODIUM 25 MCG PO TABS: 25.0000 ug | ORAL_TABLET | Freq: Every day | ORAL | Status: DC

## 2010-12-03 MED ORDER — NON FORMULARY
Status: DC
  Administered 2010-12-03 (×3): via INTRAMUSCULAR

## 2010-12-03 NOTE — Telephone Encounter (Signed)
Pt states she uses [facility]

## 2010-12-03 NOTE — Progress Notes (Signed)
  Subjective:    Patient ID: [name], female    DOB: 09/18/1923, 77 y.o.   MRN: [id number]  HPI    Review of Systems     Objective:   Physical Exam        Assessment & Plan:  a 

## 2010-12-10 ENCOUNTER — Ambulatory Visit (INDEPENDENT_AMBULATORY_CARE_PROVIDER_SITE_OTHER): Payer: Medicare Other | Admitting: Family Medicine

## 2010-12-10 DIAGNOSIS — M069 Rheumatoid arthritis, unspecified: Secondary | ICD-10-CM

## 2010-12-10 MED ORDER — NON FORMULARY: Status: DC

## 2010-12-10 NOTE — Progress Notes (Signed)
  Subjective:    Patient ID: [name], female    DOB: Aug 11, 1924, 77 y.o.   MRN: [id number]  HPI    Review of Systems     Objective:   Physical Exam        Assessment & Plan:  a

## 2010-12-17 ENCOUNTER — Ambulatory Visit (INDEPENDENT_AMBULATORY_CARE_PROVIDER_SITE_OTHER): Payer: 59 | Admitting: Family Medicine

## 2010-12-17 DIAGNOSIS — M069 Rheumatoid arthritis, unspecified: Secondary | ICD-10-CM

## 2010-12-17 MED ORDER — NON FORMULARY
Status: DC
  Administered 2010-12-17: 12:00:00 via INTRAMUSCULAR

## 2010-12-17 NOTE — Progress Notes (Signed)
  Subjective:    Patient ID: [name], female    DOB: [birth year], 77 y.o.   MRN: [id number]  HPI  Heer for mtx injection  Review of Systems     Objective:   Physical Exam        Assessment & Plan:

## 2010-12-24 ENCOUNTER — Ambulatory Visit (INDEPENDENT_AMBULATORY_CARE_PROVIDER_SITE_OTHER): Payer: Medicare Other | Admitting: Family Medicine

## 2010-12-24 DIAGNOSIS — M069 Rheumatoid arthritis, unspecified: Secondary | ICD-10-CM

## 2010-12-24 MED ORDER — NON FORMULARY
Status: DC
  Administered 2010-12-24 – 2010-12-31 (×2): via INTRAMUSCULAR

## 2010-12-24 NOTE — Progress Notes (Signed)
  Subjective:    Patient ID: [name], female    DOB: Feb 12, 1924, 77 y.o.   MRN: [id number]  HPI Here for mtx injection   Review of Systems     Objective:   Physical Exam        Assessment & Plan:

## 2010-12-31 ENCOUNTER — Ambulatory Visit (INDEPENDENT_AMBULATORY_CARE_PROVIDER_SITE_OTHER): Payer: Medicare Other | Admitting: Family Medicine

## 2010-12-31 ENCOUNTER — Ambulatory Visit: Payer: 59 | Admitting: Family Medicine

## 2010-12-31 DIAGNOSIS — M069 Rheumatoid arthritis, unspecified: Secondary | ICD-10-CM

## 2010-12-31 MED ORDER — NON FORMULARY
Status: AC
  Administered 2010-12-31 – 2011-01-07 (×2): via INTRAMUSCULAR

## 2010-12-31 NOTE — Progress Notes (Signed)
  Subjective:    Patient ID: [name], female    DOB: 04/03/1924, 77 y.o.   MRN: [id number]  HPIHer for MTX inj.     Review of Systems     Objective:   Physical Exam        Assessment & Plan:

## 2011-01-07 ENCOUNTER — Ambulatory Visit (INDEPENDENT_AMBULATORY_CARE_PROVIDER_SITE_OTHER): Payer: Medicare Other | Admitting: Family Medicine

## 2011-01-07 DIAGNOSIS — M1990 Unspecified osteoarthritis, unspecified site: Secondary | ICD-10-CM

## 2011-01-07 DIAGNOSIS — M069 Rheumatoid arthritis, unspecified: Secondary | ICD-10-CM

## 2011-01-07 DIAGNOSIS — M129 Arthropathy, unspecified: Secondary | ICD-10-CM

## 2011-01-07 MED ORDER — NON FORMULARY: Status: AC

## 2011-01-07 NOTE — Progress Notes (Signed)
  Subjective:    Patient ID: [name], female    DOB: Apr 25, 1924, 77 y.o.   MRN: [id number]  HPI B12 inj  Review of Systems     Objective:   Physical Exam        Assessment & Plan:

## 2011-01-14 ENCOUNTER — Ambulatory Visit (INDEPENDENT_AMBULATORY_CARE_PROVIDER_SITE_OTHER): Payer: Medicare Other | Admitting: Family Medicine

## 2011-01-14 DIAGNOSIS — M069 Rheumatoid arthritis, unspecified: Secondary | ICD-10-CM

## 2011-01-14 MED ORDER — NON FORMULARY
Status: DC
  Administered 2011-01-14 – 2011-01-22 (×2): via INTRAMUSCULAR

## 2011-01-14 NOTE — Progress Notes (Signed)
  Subjective:    Patient ID: [name], female    DOB: July 11, 1924, 77 y.o.   MRN: [id number]  HPI MTX injection.    Review of Systems     Objective:   Physical Exam        Assessment & Plan:

## 2011-01-21 ENCOUNTER — Ambulatory Visit (INDEPENDENT_AMBULATORY_CARE_PROVIDER_SITE_OTHER): Payer: Medicare Other | Admitting: Family Medicine

## 2011-01-21 DIAGNOSIS — M069 Rheumatoid arthritis, unspecified: Secondary | ICD-10-CM

## 2011-01-22 MED ORDER — NON FORMULARY: Status: DC

## 2011-01-22 NOTE — Progress Notes (Signed)
  Subjective:    Patient ID: [name], female    DOB: 09/01/1924, 77 y.o.   MRN: [id number]  HPI  Here for methotrexate injection  Review of Systems     Objective:   Physical Exam        Assessment & Plan:   

## 2011-01-28 ENCOUNTER — Ambulatory Visit (INDEPENDENT_AMBULATORY_CARE_PROVIDER_SITE_OTHER): Payer: Medicare Other | Admitting: Family Medicine

## 2011-01-28 DIAGNOSIS — M069 Rheumatoid arthritis, unspecified: Secondary | ICD-10-CM

## 2011-01-28 NOTE — Progress Notes (Signed)
  Subjective:    Patient ID: [name], female    DOB: 11/21/1923, 77 y.o.   MRN: [id number]  HPI  Here for methotrexate injection  Review of Systems     Objective:   Physical Exam        Assessment & Plan:   

## 2011-01-29 MED ORDER — NON FORMULARY
Status: DC
  Administered 2011-01-29 – 2011-02-05 (×2): via INTRAMUSCULAR

## 2011-02-04 ENCOUNTER — Ambulatory Visit (INDEPENDENT_AMBULATORY_CARE_PROVIDER_SITE_OTHER): Payer: Medicare Other | Admitting: Family Medicine

## 2011-02-04 DIAGNOSIS — M069 Rheumatoid arthritis, unspecified: Secondary | ICD-10-CM

## 2011-02-05 MED ORDER — NON FORMULARY: Status: DC

## 2011-02-05 NOTE — Progress Notes (Signed)
  Subjective:    Patient ID: [name], female    DOB: 07-31-1924, 77 y.o.   MRN: [id number]  HPI Here for MTX inj   Review of Systems     Objective:   Physical Exam        Assessment & Plan:

## 2011-02-11 ENCOUNTER — Ambulatory Visit (INDEPENDENT_AMBULATORY_CARE_PROVIDER_SITE_OTHER): Payer: Medicare Other | Admitting: Family Medicine

## 2011-02-11 DIAGNOSIS — Z9109 Other allergy status, other than to drugs and biological substances: Secondary | ICD-10-CM

## 2011-02-11 DIAGNOSIS — M069 Rheumatoid arthritis, unspecified: Secondary | ICD-10-CM

## 2011-02-11 DIAGNOSIS — J309 Allergic rhinitis, unspecified: Secondary | ICD-10-CM

## 2011-02-11 MED ORDER — NON FORMULARY: 0.6000 mL | Status: DC

## 2011-02-11 NOTE — Progress Notes (Signed)
  Subjective:    Patient ID: [name], female    DOB: 06-17-1924, 77 y.o.   MRN: [id number]  HPI Here for B12 injection.    Review of Systems     Objective:   Physical Exam        Assessment & Plan:

## 2011-02-18 ENCOUNTER — Telehealth: Payer: Self-pay | Admitting: Family Medicine

## 2011-02-18 ENCOUNTER — Ambulatory Visit: Payer: Medicare Other | Admitting: *Deleted

## 2011-02-19 NOTE — Telephone Encounter (Signed)
Closed

## 2011-02-25 ENCOUNTER — Ambulatory Visit (INDEPENDENT_AMBULATORY_CARE_PROVIDER_SITE_OTHER): Payer: Medicare Other | Admitting: Family Medicine

## 2011-02-25 DIAGNOSIS — M069 Rheumatoid arthritis, unspecified: Secondary | ICD-10-CM

## 2011-02-25 NOTE — Progress Notes (Signed)
  Subjective:    Patient ID: [name], female    DOB: 06/24/1924, 77 y.o.   MRN: [id number]  HPI Here for MTX inj   Review of Systems     Objective:   Physical Exam        Assessment & Plan:   

## 2011-02-26 MED ORDER — NON FORMULARY
Status: DC
  Administered 2011-02-25: 14:00:00 via SUBCUTANEOUS
  Administered 2011-02-26 – 2011-04-15 (×7): 0.6 mL via SUBCUTANEOUS

## 2011-03-04 ENCOUNTER — Ambulatory Visit (INDEPENDENT_AMBULATORY_CARE_PROVIDER_SITE_OTHER): Payer: Medicare Other | Admitting: Family Medicine

## 2011-03-04 DIAGNOSIS — M129 Arthropathy, unspecified: Secondary | ICD-10-CM

## 2011-03-04 DIAGNOSIS — M1990 Unspecified osteoarthritis, unspecified site: Secondary | ICD-10-CM

## 2011-03-04 MED ORDER — NON FORMULARY
0.6000 mL | Status: DC
  Administered 2011-03-04: 0.6 mL via SUBCUTANEOUS

## 2011-03-04 MED ORDER — NON FORMULARY: 0.6000 mL | Status: DC

## 2011-03-04 NOTE — Progress Notes (Signed)
Addended by: Gifford Shave on: 03/04/2011 10:55 AM   Modules accepted: Orders

## 2011-03-08 MED ORDER — NON FORMULARY: 0.6000 mL | Status: DC

## 2011-03-08 NOTE — Progress Notes (Signed)
Addended by: Gifford Shave on: 03/08/2011 03:38 PM   Modules accepted: Orders

## 2011-03-11 ENCOUNTER — Ambulatory Visit (INDEPENDENT_AMBULATORY_CARE_PROVIDER_SITE_OTHER): Payer: Medicare Other | Admitting: Family Medicine

## 2011-03-11 VITALS — BP 135/70 | HR 89 | Wt 106.0 lb

## 2011-03-11 DIAGNOSIS — M069 Rheumatoid arthritis, unspecified: Secondary | ICD-10-CM

## 2011-03-11 NOTE — Progress Notes (Signed)
  Subjective:    Patient ID: [name], female    DOB: 10/08/1923, 77 y.o.   MRN: [id number]  HPI  Here for methotrexate injection  Review of Systems     Objective:   Physical Exam        Assessment & Plan:   

## 2011-03-18 ENCOUNTER — Ambulatory Visit (INDEPENDENT_AMBULATORY_CARE_PROVIDER_SITE_OTHER): Payer: Medicare Other | Admitting: Family Medicine

## 2011-03-18 DIAGNOSIS — M069 Rheumatoid arthritis, unspecified: Secondary | ICD-10-CM

## 2011-03-18 NOTE — Progress Notes (Signed)
  Subjective:    Patient ID: [name], female    DOB: July 03, 1924, 77 y.o.   MRN: [id number]  HPI  Here for methotrexate injection  Review of Systems     Objective:   Physical Exam        Assessment & Plan:

## 2011-03-25 ENCOUNTER — Ambulatory Visit (INDEPENDENT_AMBULATORY_CARE_PROVIDER_SITE_OTHER): Payer: Medicare Other | Admitting: Family Medicine

## 2011-03-25 DIAGNOSIS — M069 Rheumatoid arthritis, unspecified: Secondary | ICD-10-CM

## 2011-04-01 ENCOUNTER — Ambulatory Visit (INDEPENDENT_AMBULATORY_CARE_PROVIDER_SITE_OTHER): Payer: Medicare Other | Admitting: Family Medicine

## 2011-04-01 VITALS — BP 145/85 | HR 96 | Resp 20

## 2011-04-01 DIAGNOSIS — M069 Rheumatoid arthritis, unspecified: Secondary | ICD-10-CM

## 2011-04-01 NOTE — Progress Notes (Signed)
  Subjective:    Patient ID: [name], female    DOB: [birth year], 77 y.o.   MRN: [id number]  HPI  Here for MTX injection.   Review of Systems     Objective:   Physical Exam        Assessment & Plan:

## 2011-04-08 ENCOUNTER — Telehealth: Payer: Self-pay | Admitting: Family Medicine

## 2011-04-08 ENCOUNTER — Ambulatory Visit (INDEPENDENT_AMBULATORY_CARE_PROVIDER_SITE_OTHER): Payer: Medicare Other | Admitting: Family Medicine

## 2011-04-08 VITALS — BP 154/65 | HR 91 | Temp 98.7°F | Resp 20 | Wt 105.0 lb

## 2011-04-08 DIAGNOSIS — M069 Rheumatoid arthritis, unspecified: Secondary | ICD-10-CM

## 2011-04-08 DIAGNOSIS — R300 Dysuria: Secondary | ICD-10-CM

## 2011-04-08 DIAGNOSIS — R350 Frequency of micturition: Secondary | ICD-10-CM

## 2011-04-08 LAB — POCT URINALYSIS DIPSTICK
Glucose, UA: NEGATIVE
Ketones, UA: NEGATIVE
Spec Grav, UA: 1.015

## 2011-04-08 MED ORDER — CIPROFLOXACIN HCL 500 MG PO TABS: 500.0000 mg | ORAL_TABLET | Freq: Two times a day (BID) | ORAL | Status: AC

## 2011-04-08 NOTE — Telephone Encounter (Signed)
Closed

## 2011-04-09 NOTE — Progress Notes (Signed)
  Subjective:    Patient ID: [name], female    DOB: May 26, 1924, 77 y.o.   MRN: [id number]  HPI Urinary frequency from his 1 month. For the last few days she is havingurinary frequency every hour. No hematuria. She is on immunosuppressive therapy because of her rheumatoid arthritis. Her rheumatologist  recommended that she come in to have her urine checked today.   Review of Systems     Objective:   Physical Exam        Assessment & Plan:  UTI-appears to be positive I will treat her. She's to call if her symptoms are not completely relieved.

## 2011-04-09 NOTE — Progress Notes (Signed)
Pt presented today go get her methotrexate injection 0.6 ml.  She proceeded to explain to the nurse that she had seen her rheumatologist and he had suggested that she get her urine checked at our office today.  Pt appeared pale and some swollen around the eyes, and not her perky self.  Stated she had been having pain and burning with urination when asked if symptomatic, and urinating every hour.   Plan:  Pts vitals taken and a clean catch urine was obtained.  Showed large blood.  Everything else neg.  Urine was sent for culture.  Pt was sent a antibiotic treatment to her pharm electronically.  Pt was instructed to drink lots of water and cranberry juice.  Told to call if still symptomatic after completion of Ab tx. Jarvis Newcomer, LPN [facility]

## 2011-04-11 LAB — URINE CULTURE

## 2011-04-12 ENCOUNTER — Telehealth: Payer: Self-pay | Admitting: Family Medicine

## 2011-04-12 NOTE — Telephone Encounter (Signed)
NO XRAYS ON FILE. We can order one if he would like?

## 2011-04-12 NOTE — Telephone Encounter (Signed)
Provider Dr. Dareen Piano needs Dr. Linford Arnold to return his call regarding this pt. You may reach him at [phone].

## 2011-04-12 NOTE — Telephone Encounter (Signed)
Pt.notified

## 2011-04-12 NOTE — Telephone Encounter (Signed)
Larita Fife an Rn with Dr. Dareen Piano office called back and said Dr. Dareen Piano wants to know if pt has had a lung xray in the past at our office and if so will need a copy sent to them at fax number [phone].  If not, will need to let them know as well.  I didn't see any lung xrays.  Please advise. Routed to Dr. Marlyne Beards, LPN [facility]

## 2011-04-12 NOTE — Telephone Encounter (Signed)
Call pt: Urine culture grew out many species so contaminated. Is she feeling better or still having sxs? If stil having sxs then will need repeat culture collection.

## 2011-04-12 NOTE — Telephone Encounter (Signed)
Dr. Dareen Piano had called and I spoke with Dr. Linford Arnold letting her know that the Dr. Was requesting her to call him back regarding this pt. Plan:  Dr. Linford Arnold asked for the triage nurse to call back to see what was needed. LM with the nurse at [facility] to see what is needed so mess can be relayed to Dr. Marlyne Beards, LPN [facility]

## 2011-04-13 ENCOUNTER — Other Ambulatory Visit: Payer: Self-pay | Admitting: Family Medicine

## 2011-04-13 NOTE — Telephone Encounter (Signed)
Notified Larita Fife, RN to let Dr. Dareen Piano know that we do not have any XRAYS on file for this pt. Lauren Newcomer, LPN [facility]

## 2011-04-15 ENCOUNTER — Ambulatory Visit (INDEPENDENT_AMBULATORY_CARE_PROVIDER_SITE_OTHER): Payer: Medicare Other | Admitting: Family Medicine

## 2011-04-15 DIAGNOSIS — M069 Rheumatoid arthritis, unspecified: Secondary | ICD-10-CM

## 2011-04-15 NOTE — Progress Notes (Signed)
  Subjective:    Patient ID: [name], female    DOB: 1924/02/09, 77 y.o.   MRN: [id number]  HPI  MTX inj.   Review of Systems     Objective:   Physical Exam        Assessment & Plan:

## 2011-04-21 ENCOUNTER — Telehealth: Payer: Self-pay | Admitting: Family Medicine

## 2011-04-21 NOTE — Telephone Encounter (Signed)
Pt called and said she had seen her rheumatologist recently (Dr. Dareen Piano), and he has stopped her methotrexate injections.  Wanted Dr. Linford Arnold to know.  Did CXR on 04-08-11.  Dr. Dareen Piano said the methotrexate might have caused some lines to appear on xray of lungs.  Pt is suppose to call the doctor back in 4 weeks due to SOB and see if she is to resume the shots. Plan:  Pt informed that information will be sent to Dr. Linford Arnold for [facility] purposes. Jarvis Newcomer, LPN [facility]

## 2011-04-22 ENCOUNTER — Ambulatory Visit: Payer: Medicare Other

## 2011-06-01 ENCOUNTER — Encounter: Payer: Self-pay | Admitting: Family Medicine

## 2011-06-07 ENCOUNTER — Ambulatory Visit (INDEPENDENT_AMBULATORY_CARE_PROVIDER_SITE_OTHER): Payer: Medicare Other | Admitting: Family Medicine

## 2011-06-07 ENCOUNTER — Encounter: Payer: Self-pay | Admitting: Family Medicine

## 2011-06-07 VITALS — BP 178/84 | HR 106 | Wt 104.0 lb

## 2011-06-07 DIAGNOSIS — R030 Elevated blood-pressure reading, without diagnosis of hypertension: Secondary | ICD-10-CM

## 2011-06-07 DIAGNOSIS — Z13220 Encounter for screening for lipoid disorders: Secondary | ICD-10-CM

## 2011-06-07 DIAGNOSIS — Z1231 Encounter for screening mammogram for malignant neoplasm of breast: Secondary | ICD-10-CM

## 2011-06-07 DIAGNOSIS — Z0000 Encounter for general adult medical examination without abnormal findings: Secondary | ICD-10-CM

## 2011-06-07 DIAGNOSIS — I10 Essential (primary) hypertension: Secondary | ICD-10-CM

## 2011-06-07 MED ORDER — LEVOTHYROXINE SODIUM 50 MCG PO TABS: 50.0000 ug | ORAL_TABLET | Freq: Every day | ORAL | Status: DC

## 2011-06-07 NOTE — Progress Notes (Signed)
Subjective:    [name] is a 77 y.o. female who presents for Medicare Annual/Subsequent preventive examination.  Preventive Screening-Counseling & Management  Tobacco History  Smoking status  . Former Smoker  . Types: Cigarettes  Smokeless tobacco  . Former Neurosurgeon  . Quit date: 09/06/1985     Problems Prior to Visit 1.   Current Problems (verified) Patient Active Problem List  Diagnoses  . CARCINOMA, BASAL CELL  . UNSPECIFIED HYPOTHYROIDISM  . RHEUMATOID ARTHRITIS  . ABDOMINAL PAIN    Medications Prior to Visit Current Outpatient Prescriptions on File Prior to Visit  Medication Sig Dispense Refill  . acetaminophen (TYLENOL) 325 MG tablet Take 650 mg by mouth.        [name] alendronate (FOSAMAX) 70 MG tablet       . CALCIUM CITRATE PO Take by mouth 2 (two) times daily.        . famotidine (PEPCID) 20 MG tablet Take 20 mg by mouth daily.        . fentaNYL (DURAGESIC - DOSED MCG/HR) 12 MCG/HR Place 1 patch onto the skin every 3 (three) days.        . folic acid (FOLVITE) 1 MG tablet       . levothyroxine (SYNTHROID, LEVOTHROID) 25 MCG tablet TAKE ONE (1) TABLET EACH DAY  30 tablet  1  . methotrexate 25 MG/ML SOLN       . predniSONE (DELTASONE) 5 MG tablet        Current Facility-Administered Medications on File Prior to Visit  Medication Dose Route Frequency Provider Last Rate Last Dose  . NON FORMULARY 0.6 mL  0.6 mL Oral Weekly Nani Gasser, MD      . NON FORMULARY 0.6 mL  0.6 mL Subcutaneous Weekly Nani Gasser, MD      . NON FORMULARY 0.6 mL  0.6 mL Subcutaneous Weekly Seymour Bars, DO   0.6 mL at 03/04/11 1131  . NON FORMULARY 0.6 mL  0.6 mL Subcutaneous Weekly Nani Gasser, MD      . NON FORMULARY   Injection 1 day or 1 dose Nani Gasser, MD      . NON FORMULARY   Injection 1 day or 1 dose Nani Gasser, MD      . Myriam Forehand FORMULARY   Injection 1 day or 1 dose Nani Gasser, MD      . Myriam Forehand FORMULARY   Injection 1 day or 1 dose Nani Gasser, MD      . Myriam Forehand FORMULARY   Injection 1 day or 1 dose Nani Gasser, MD      . Myriam Forehand FORMULARY   Injection 1 day or 1 dose Nani Gasser, MD      . Myriam Forehand FORMULARY   Subcutaneous Weekly Nani Gasser, MD   0.6 mL at 04/15/11 1127    Current Medications (verified) Current Outpatient Prescriptions  Medication Sig Dispense Refill  . acetaminophen (TYLENOL) 325 MG tablet Take 650 mg by mouth.        [name] alendronate (FOSAMAX) 70 MG tablet       . CALCIUM CITRATE PO Take by mouth 2 (two) times daily.        . famotidine (PEPCID) 20 MG tablet Take 20 mg by mouth daily.        . fentaNYL (DURAGESIC - DOSED MCG/HR) 12 MCG/HR Place 1 patch onto the skin every 3 (three) days.        . folic acid (FOLVITE) 1 MG tablet       .  levothyroxine (SYNTHROID, LEVOTHROID) 25 MCG tablet TAKE ONE (1) TABLET EACH DAY  30 tablet  1  . methotrexate 25 MG/ML SOLN       . predniSONE (DELTASONE) 5 MG tablet        Current Facility-Administered Medications  Medication Dose Route Frequency Provider Last Rate Last Dose  . NON FORMULARY 0.6 mL  0.6 mL Oral Weekly Nani Gasser, MD      . NON FORMULARY 0.6 mL  0.6 mL Subcutaneous Weekly Nani Gasser, MD      . NON FORMULARY 0.6 mL  0.6 mL Subcutaneous Weekly Seymour Bars, DO   0.6 mL at 03/04/11 1131  . NON FORMULARY 0.6 mL  0.6 mL Subcutaneous Weekly Nani Gasser, MD      . NON FORMULARY   Injection 1 day or 1 dose Nani Gasser, MD      . NON FORMULARY   Injection 1 day or 1 dose Nani Gasser, MD      . NON FORMULARY   Injection 1 day or 1 dose Nani Gasser, MD      . Myriam Forehand FORMULARY   Injection 1 day or 1 dose Nani Gasser, MD      . Myriam Forehand FORMULARY   Injection 1 day or 1 dose Nani Gasser, MD      . Myriam Forehand FORMULARY   Injection 1 day or 1 dose Nani Gasser, MD      . Myriam Forehand FORMULARY   Subcutaneous Weekly Nani Gasser, MD   0.6 mL at 04/15/11 1127     Allergies (verified) Review of patient's  allergies indicates no known allergies.   PAST HISTORY  Family History Family History  Problem Relation Age of Onset  . Cancer Mother     bladder  . Hyperlipidemia Sister   . Stroke Maternal Aunt     Social History History  Substance Use Topics  . Smoking status: Former Smoker    Types: Cigarettes  . Smokeless tobacco: Former Neurosurgeon    Quit date: 09/06/1985  . Alcohol Use: No     Are there smokers in your home (other than you)? Yes  Risk Factors Current exercise habits: Exercise is limited by orthopedic condition(s): does get on the treatmill sometimes. .  Dietary issues discussed: balanced diet    Cardiac risk factors: advanced age (older than 84 for men, 45 for women), hypertension and smoking/ tobacco exposure.  Depression Screen (Note: if answer to either of the following is "Yes", a more complete depression screening is indicated)   Over the past two weeks, have you felt down, depressed or hopeless? Yes  Over the past two weeks, have you felt little interest or pleasure in doing things? Yes  Have you lost interest or pleasure in daily life? Yes  Do you often feel hopeless? No  Do you cry easily over simple problems? No She says she has mainly felt down the last couple of weeks bc she had to stop her MTX bc of CXR finding. The has been more achy and uncomfortable and upset about the findings. She did bring a CD with her today so I will print a copy of the report.   Activities of Daily Living In your present state of health, do you have any difficulty performing the following activities?:  Driving? Yes Managing money?  No Feeding yourself? No Getting from bed to chair? No Climbing a flight of stairs? No Preparing food and eating?: No Bathing or showering? No Getting dressed: No Getting to the toilet?  No Using the toilet:No Moving around from place to place: No In the past year have you fallen or had a near fall?:No   Are you sexually active?  No  Do you have  more than one partner?  No  Hearing Difficulties: Yes Do you often ask people to speak up or repeat themselves? Yes Do you experience ringing or noises in your ears? No Do you have difficulty understanding soft or whispered voices? Yes   Do you feel that you have a problem with memory? Yes, says takes longer to recall things.   Do you often misplace items? No  Do you feel safe at home?  Yes  Cognitive Testing  Alert? Yes  Normal Appearance?Yes  Oriented to person? Yes  Place? Yes   Time? Yes  Recall of three objects?  Yes  Can perform simple calculations? Yes  Displays appropriate judgment?Yes  Can read the correct time from a watch face?Yes   Advanced Directives have been discussed with the patient? Yes  List the Names of Other Physician/Practitioners you currently use: 1.  Dr. Durenda Age ( rheumatology)  Indicate any recent Medical Services you may have received from other than [facility] providers in the past year (date may be approximate).  Immunization History  Administered Date(s) Administered  . Influenza Whole 06/30/2010  . Zoster 02/04/2010    Screening Tests Health Maintenance  Topic Date Due  . Tetanus/tdap  06/18/1945  . Colonoscopy  06/18/1976  . Pneumococcal Polysaccharide Vaccine Age 103 And Over  06/19/1991  . Influenza Vaccine  06/07/2011  . Zostavax  Completed    All answers were reviewed with the patient and necessary referrals were made:  METHENEY,CATHERINE, MD   06/07/2011   History reviewed: allergies, current medications, past family history, past medical history, past social history, past surgical history and problem list  Review of Systems A comprehensive review of systems was negative.    Objective:     Vision by Snellen chart: right eye:20/50, left eye:20/50  There is no height on file to calculate BMI. BP 178/84  Pulse 106  Wt 104 lb (47.174 kg)  BP 178/84  Pulse 106  Wt 104 lb (47.174 kg) General appearance: alert, cooperative and  appears stated age Head: Normocephalic, without obvious abnormality, atraumatic Eyes: conjunctiva clear, EOMi, PEERLA Ears: normal TM's and external ear canals both ears Nose: Nares normal. Septum midline. Mucosa normal. No drainage or sinus tenderness. Throat: lips, mucosa, and tongue normal; teeth and gums normal Neck: no adenopathy, no carotid bruit, supple, symmetrical, trachea midline and thyroid not enlarged, symmetric, no tenderness/mass/nodules Back: mild kyphosis Lungs: clear to auscultation bilaterally Heart: regular rate and rhythm, S1, S2 normal, no murmur, click, rub or gallop Abdomen: soft, non-tender; bowel sounds normal; no masses,  no organomegaly Extremities: extremities normal, atraumatic, no cyanosis or edema Pulses: 2+ and symmetric Skin: Skin color, texture, turgor normal. No rashes or lesions Lymph nodes: cervical and supraclavicular nodes are neg Neurologic: Grossly normal. Patella and bicep reflexes are 1+ bilat.      Assessment:     Annual Wellness Exam.      Plan:     During the course of the visit the patient was educated and counseled about appropriate screening and preventive services including:    [facility]fluenza vaccine. [facility]  Other vaccines are up to date including shingles vaccine  No longer needs colonoscopy  Due for labwork  Increase synthroid back to . Was 25 bc thought was  making her dizzy.   Diet review for nutrition referral? Yes ____  Not Indicated __x__   Patient Instructions (the written plan) was given to the patient.  Medicare Attestation I have personally reviewed: The patient's medical and social history Their use of alcohol, tobacco or illicit drugs Their current medications and supplements The patient's functional ability including ADLs,fall risks, home safety risks, cognitive, and hearing and visual impairment Diet and physical activities Evidence for depression or mood  disorders  The patient's weight, height, BMI, and visual acuity have been recorded in the chart.  I have made referrals, counseling, and provided education to the patient based on review of the above and I have provided the patient with a written personalized care plan for preventive services.     METHENEY,CATHERINE, MD   06/07/2011

## 2011-06-07 NOTE — Patient Instructions (Signed)
Start a regular exercise program and make sure you are eating a healthy diet Try to eat 4 servings of dairy a day or take a calcium supplement (500mg twice a day). Your vaccines are up to date.   

## 2011-06-08 ENCOUNTER — Inpatient Hospital Stay (HOSPITAL_COMMUNITY)
Admission: AD | Admit: 2011-06-08 | Discharge: 2011-06-11 | DRG: 312 | Disposition: A | Discharge status: Skilled Nursing Facility | Payer: Medicare Other | Source: Other Acute Inpatient Hospital | Attending: Family Medicine | Admitting: Family Medicine

## 2011-06-08 ENCOUNTER — Emergency Department (HOSPITAL_BASED_OUTPATIENT_CLINIC_OR_DEPARTMENT_OTHER)
Admission: EM | Admit: 2011-06-08 | Discharge: 2011-06-08 | Disposition: A | Discharge status: Other Acute Inpatient Hospital | Payer: Medicare Other | Source: Home / Self Care | Attending: Emergency Medicine | Admitting: Emergency Medicine

## 2011-06-08 ENCOUNTER — Emergency Department (INDEPENDENT_AMBULATORY_CARE_PROVIDER_SITE_OTHER): Payer: Medicare Other

## 2011-06-08 ENCOUNTER — Inpatient Hospital Stay (HOSPITAL_COMMUNITY): Payer: Medicare Other

## 2011-06-08 ENCOUNTER — Encounter (HOSPITAL_BASED_OUTPATIENT_CLINIC_OR_DEPARTMENT_OTHER): Payer: Self-pay

## 2011-06-08 ENCOUNTER — Other Ambulatory Visit: Payer: Self-pay

## 2011-06-08 DIAGNOSIS — I951 Orthostatic hypotension: Principal | ICD-10-CM | POA: Diagnosis present

## 2011-06-08 DIAGNOSIS — IMO0002 Reserved for concepts with insufficient information to code with codable children: Secondary | ICD-10-CM

## 2011-06-08 DIAGNOSIS — I4891 Unspecified atrial fibrillation: Secondary | ICD-10-CM | POA: Diagnosis present

## 2011-06-08 DIAGNOSIS — R32 Unspecified urinary incontinence: Secondary | ICD-10-CM | POA: Diagnosis present

## 2011-06-08 DIAGNOSIS — M069 Rheumatoid arthritis, unspecified: Secondary | ICD-10-CM | POA: Diagnosis present

## 2011-06-08 DIAGNOSIS — R220 Localized swelling, mass and lump, head: Secondary | ICD-10-CM

## 2011-06-08 DIAGNOSIS — E079 Disorder of thyroid, unspecified: Secondary | ICD-10-CM | POA: Insufficient documentation

## 2011-06-08 DIAGNOSIS — S0990XA Unspecified injury of head, initial encounter: Secondary | ICD-10-CM

## 2011-06-08 DIAGNOSIS — Z8739 Personal history of other diseases of the musculoskeletal system and connective tissue: Secondary | ICD-10-CM | POA: Insufficient documentation

## 2011-06-08 DIAGNOSIS — R55 Syncope and collapse: Secondary | ICD-10-CM | POA: Insufficient documentation

## 2011-06-08 DIAGNOSIS — H8110 Benign paroxysmal vertigo, unspecified ear: Secondary | ICD-10-CM | POA: Diagnosis present

## 2011-06-08 DIAGNOSIS — D72829 Elevated white blood cell count, unspecified: Secondary | ICD-10-CM | POA: Diagnosis present

## 2011-06-08 DIAGNOSIS — R5381 Other malaise: Secondary | ICD-10-CM | POA: Diagnosis present

## 2011-06-08 DIAGNOSIS — J449 Chronic obstructive pulmonary disease, unspecified: Secondary | ICD-10-CM | POA: Insufficient documentation

## 2011-06-08 DIAGNOSIS — Y9301 Activity, walking, marching and hiking: Secondary | ICD-10-CM

## 2011-06-08 DIAGNOSIS — Y9229 Other specified public building as the place of occurrence of the external cause: Secondary | ICD-10-CM

## 2011-06-08 DIAGNOSIS — W010XXA Fall on same level from slipping, tripping and stumbling without subsequent striking against object, initial encounter: Secondary | ICD-10-CM | POA: Diagnosis present

## 2011-06-08 DIAGNOSIS — W19XXXA Unspecified fall, initial encounter: Secondary | ICD-10-CM | POA: Insufficient documentation

## 2011-06-08 DIAGNOSIS — J4489 Other specified chronic obstructive pulmonary disease: Secondary | ICD-10-CM | POA: Insufficient documentation

## 2011-06-08 DIAGNOSIS — T148XXA Other injury of unspecified body region, initial encounter: Secondary | ICD-10-CM

## 2011-06-08 DIAGNOSIS — G319 Degenerative disease of nervous system, unspecified: Secondary | ICD-10-CM

## 2011-06-08 DIAGNOSIS — Z79899 Other long term (current) drug therapy: Secondary | ICD-10-CM | POA: Insufficient documentation

## 2011-06-08 HISTORY — DX: Unspecified atrial fibrillation: I48.91

## 2011-06-08 LAB — URINALYSIS, ROUTINE W REFLEX MICROSCOPIC
Glucose, UA: NEGATIVE mg/dL
Hgb urine dipstick: NEGATIVE
Ketones, ur: 15 mg/dL — AB
Protein, ur: NEGATIVE mg/dL

## 2011-06-08 LAB — CBC
MCH: 35 pg — ABNORMAL HIGH (ref 26.0–34.0)
Platelets: 278 10*3/uL (ref 150–400)
RBC: 3.69 MIL/uL — ABNORMAL LOW (ref 3.87–5.11)
WBC: 11.2 10*3/uL — ABNORMAL HIGH (ref 4.0–10.5)

## 2011-06-08 LAB — CARDIAC PANEL(CRET KIN+CKTOT+MB+TROPI)
CK, MB: 5 ng/mL — ABNORMAL HIGH (ref 0.3–4.0)
Relative Index: 3.4 — ABNORMAL HIGH (ref 0.0–2.5)
Total CK: 111 U/L (ref 7–177)

## 2011-06-08 LAB — BASIC METABOLIC PANEL
CO2: 27 mEq/L (ref 19–32)
Calcium: 9.9 mg/dL (ref 8.4–10.5)
Potassium: 3.6 mEq/L (ref 3.5–5.1)
Sodium: 133 mEq/L — ABNORMAL LOW (ref 135–145)

## 2011-06-08 LAB — MAGNESIUM: Magnesium: 2.2 mg/dL (ref 1.5–2.5)

## 2011-06-08 MED ORDER — GADOBENATE DIMEGLUMINE 529 MG/ML IV SOLN
9.0000 mL | Freq: Once | INTRAVENOUS | Status: AC | PRN
  Administered 2011-06-08: 9 mL via INTRAVENOUS

## 2011-06-08 NOTE — ED Notes (Signed)
Dr. Magnus Ivan office phoned for pt medication list. Office closed, left message requesting list be faxed to me and/or call back.  Pt does not know her meds or dosages, or what pharmacy she uses "I just changed pharmacies and I can't remember.Marland KitchenMarland Kitchen"

## 2011-06-08 NOTE — ED Notes (Signed)
Report phoned to tes, rn on 1400.

## 2011-06-08 NOTE — ED Notes (Signed)
Pt was at [facility], had a syncopal episode, fell and struck her head on the floor.  Brief LOC per EMS and laceration to scalp.

## 2011-06-08 NOTE — ED Notes (Signed)
Attempted to phone report to 1400 at [facility], nurse is busy and cannot take report. Will call back for report on charge nurse phone.

## 2011-06-08 NOTE — ED Provider Notes (Signed)
History     CSN: [id number] Arrival date & time: 06/08/2011 11:57 AM  Chief Complaint  Patient presents with  . Loss of Consciousness  . Head Laceration    (Consider location/radiation/quality/duration/timing/severity/associated sxs/prior treatment) HPI Comments: Pt states that she has been feeling sob over the last month, pt states that she has also felt near syncopal for the last month, but when she feels that way she states that she sits down and puts her head down and the symptoms resolve after a couple of minutes:pt states that today at [employer] she just couldn't sit down fast enough:pt state that she has constant discomfort in her left chest over the last month:pt states that she was seen yesterday at her pcp office and was told that she had high bp which she has no history of, but nothing else was done:pt states that she hit her head when she fell  Patient is a 77 y.o. female presenting with syncope. The history is provided by the patient and the EMS personnel. No language interpreter was used.  Loss of Consciousness This is a new problem. The current episode started today. The symptoms are aggravated by nothing. She has tried nothing for the symptoms.  Loss of Consciousness This is a new problem. The current episode started today. The symptoms are aggravated by nothing. She has tried nothing for the symptoms.    Past Medical History  Diagnosis Date  . Arthritis   . Mitral valve prolapse   . Hiatal hernia   . Diverticulitis   . COPD (chronic obstructive pulmonary disease)   . Thyroid disease   . Atrial fibrillation     Past Surgical History  Procedure Date  . Hand surgery 2010    right    Family History  Problem Relation Age of Onset  . Cancer Mother     bladder  . Hyperlipidemia Sister   . Stroke Maternal Aunt     History  Substance Use Topics  . Smoking status: Former Smoker    Types: Cigarettes  . Smokeless tobacco: Former Neurosurgeon    Quit date: 09/06/1985  .  Alcohol Use: No    OB History    Grav Para Term Preterm Abortions TAB SAB Ect Mult Living                  Review of Systems  Cardiovascular: Positive for syncope.  All other systems reviewed and are negative.    Allergies  Review of patient's allergies indicates no known allergies.  Home Medications   Current Outpatient Rx  Name Route Sig Dispense Refill  . ACETAMINOPHEN 650 MG PO TBCR Oral Take 650 mg by mouth 3 (three) times daily.      . ALENDRONATE SODIUM 70 MG PO TABS      . CALCIUM 600/VITAMIN D PO Oral Take by mouth.      . FAMOTIDINE 20 MG PO TABS Oral Take 20 mg by mouth daily.      [name] FOLIC ACID 1 MG PO TABS      . LEFLUNOMIDE 10 MG PO TABS Oral Take 10 mg by mouth daily.      [name] LEVOTHYROXINE SODIUM 50 MCG PO TABS Oral Take 1 tablet (50 mcg total) by mouth daily. 30 tablet 1  . PHILLIPS PO Oral Take by mouth.      [name] PREDNISONE 5 MG PO TABS        BP 171/63  Pulse 91  Temp(Src) 97.4 F (36.3 C) (Oral)  Resp  16  Ht 5\' 6"  (1.676 m)  Wt 104 lb (47.174 kg)  BMI 16.79 kg/m2  SpO2 100%  Physical Exam  Nursing note and vitals reviewed. Constitutional: She is oriented to person, place, and time. She appears well-developed and well-nourished.  HENT:  Head: Normocephalic.  Eyes: Pupils are equal, round, and reactive to light.  Neck: Normal range of motion. Neck supple.  Cardiovascular: Normal rate and regular rhythm.   Pulmonary/Chest: Effort normal.  Musculoskeletal: Normal range of motion.  Neurological: She is alert and oriented to person, place, and time.  Skin:       Pt has an abrasion and hematoma to the left scalp  Psychiatric: She has a normal mood and affect.    ED Course  Procedures (including critical care time)  Labs Reviewed  CBC - Abnormal; Notable for the following:    WBC 11.2 (*)    RBC 3.69 (*)    MCV 102.4 (*)    MCH 35.0 (*)    All other components within normal limits  BASIC METABOLIC PANEL - Abnormal; Notable for the following:     Sodium 133 (*)    Chloride 95 (*)    GFR calc non Af Amer 77 (*)    GFR calc Af Amer 90 (*)    All other components within normal limits  CARDIAC PANEL(CRET KIN+CKTOT+MB+TROPI) - Abnormal; Notable for the following:    CK, MB 5.0 (*)    Relative Index 4.5 (*)    All other components within normal limits  URINALYSIS, ROUTINE W REFLEX MICROSCOPIC - Abnormal; Notable for the following:    Ketones, ur 15 (*)    All other components within normal limits   Dg Chest 2 View  06/08/2011  *RADIOLOGY REPORT*  Clinical Data: , syncope  CHEST - 2 VIEW  Comparison: 04/08/2011  Findings: Cardiomediastinal silhouette is stable.  No acute infiltrate or pleural effusion.  No pulmonary edema.  Mild hyperinflation again noted.  IMPRESSION: No active disease.  Mild hyperinflation again noted.  Original Report Authenticated By: Natasha Mead, M.D.   Ct Head Wo Contrast  06/08/2011  *RADIOLOGY REPORT*  Clinical Data: Syncope  CT HEAD WITHOUT CONTRAST  Technique:  Contiguous axial images were obtained from the base of the skull through the vertex without contrast.  Comparison: None.  Findings: No skull fracture is noted.  Paranasal sinuses and mastoid air cells are unremarkable.  No intracranial hemorrhage, mass effect or midline shift.  Moderate cerebral atrophy.  Mild periventricular white matter decreased attenuation is probable due to chronic small vessel ischemic changes.  No acute infarction.  No mass lesion is noted on this unenhanced scan.  There is scalp swelling and subcutaneous stranding in the left parietal scalp best seen in axial image 24.  IMPRESSION: No acute fracture or subluxation.  Scalp swelling and subcutaneous stranding in the left posterior parietal scalp. Moderate cerebral atrophy.  Original Report Authenticated By: Natasha Mead, M.D.     1. Syncope   2. Head injury   3. Abrasion      Date: 06/08/2011  Rate: 87  Rhythm: normal sinus rhythm  QRS Axis: normal  Intervals: normal  ST/T Wave  abnormalities: nonspecific ST/T changes  Conduction Disutrbances:right bundle branch block  Narrative Interpretation:   Old EKG Reviewed: none available   MDM  Pt to be admitted to [place] for syncope work up        [employer], NP 06/08/11 1436  Teressa Lower, NP 06/08/11 1436  Medical  screening examination/treatment/procedure(s) were conducted as a shared visit with non-physician practitioner(s) and myself.  I personally evaluated the patient during the encounter.  Pt s/p syncopal event with laceration to posterior scalp, pt awake, alert, c/o mild headache.  Neuro exam normal. Pt transferred for admission for syncope workup  Ethelda Chick, MD 06/08/11 1538

## 2011-06-08 NOTE — ED Notes (Signed)
I took vitals and gave info to nurse to document. I cleaned patient's hair of dried blood. Medics stated they felt patient had two lacerations. After cleaning scalp and hair, I noticed only a one small laceration with very little skin separation.

## 2011-06-08 NOTE — ED Notes (Signed)
Pt assisted to call her daughter Raynelle Fanning, daughter is aware of pending admission and plan of care.

## 2011-06-09 DIAGNOSIS — I059 Rheumatic mitral valve disease, unspecified: Secondary | ICD-10-CM

## 2011-06-09 DIAGNOSIS — R55 Syncope and collapse: Secondary | ICD-10-CM

## 2011-06-09 LAB — CARDIAC PANEL(CRET KIN+CKTOT+MB+TROPI)
CK, MB: 6 ng/mL — ABNORMAL HIGH (ref 0.3–4.0)
Relative Index: 1.8 (ref 0.0–2.5)
Total CK: 253 U/L — ABNORMAL HIGH (ref 7–177)

## 2011-06-10 LAB — BASIC METABOLIC PANEL
BUN: 10 mg/dL (ref 6–23)
Chloride: 100 mEq/L (ref 96–112)
GFR calc Af Amer: >90 mL/min (ref >90)
Glucose, Bld: 104 mg/dL — ABNORMAL HIGH (ref 70–99)
Potassium: 3.6 mEq/L (ref 3.5–5.1)

## 2011-06-10 LAB — CBC
HCT: 39.9 % (ref 36.0–46.0)
Hemoglobin: 13.1 g/dL (ref 12.0–15.0)
WBC: 10.1 10*3/uL (ref 4.0–10.5)

## 2011-06-11 LAB — CBC
Hemoglobin: 12.8 g/dL (ref 12.0–15.0)
MCH: 35 pg — ABNORMAL HIGH (ref 26.0–34.0)
MCV: 104.6 fL — ABNORMAL HIGH (ref 78.0–100.0)
RBC: 3.66 MIL/uL — ABNORMAL LOW (ref 3.87–5.11)

## 2011-06-11 LAB — BASIC METABOLIC PANEL
CO2: 30 mEq/L (ref 19–32)
Calcium: 9.5 mg/dL (ref 8.4–10.5)
Chloride: 99 mEq/L (ref 96–112)
Glucose, Bld: 91 mg/dL (ref 70–99)
Sodium: 135 mEq/L (ref 135–145)

## 2011-06-16 NOTE — Consult Note (Signed)
NAMEHALLIE, [name]                 ACCOUNT NO.:  [id number]  MEDICAL RECORD NO.:  [id number]  LOCATION:  1439                         FACILITY:  [facility]  PHYSICIAN:  Cassell Clement, M.D. DATE OF BIRTH:  [birth year]  DATE OF CONSULTATION: DATE OF DISCHARGE:                                CONSULTATION   CHIEF COMPLAINT:  Syncope.  HISTORY:  We were asked to see this almost 77 year old Caucasian female who was admitted on June 08, 2011, with syncope.  She was shopping at [facility] yesterday morning at about 10 a.m. and suddenly blacked out without any warning.  She woke up surrounded by EMTs.  She does not have any prior history of frank syncope, but had a presyncopal episode a month earlier, which was abated after she sat down and put her head between her legs.  One day prior to yesterday's syncope, the patient had seen her primary care physician, Dr. Linford Arnold, who told her that her blood pressure was high.  However, no change in medication was undertaken yesterday.  The patient gives a history that she has complained for a long time that her heart races when she walks.  She does not have any exertional chest discomfort, but does get short of breath and has to stop and sit down and rest.  She does not have any history of congestive heart failure. She sleeps on 1 pillow.  She does not have any history of paroxysmal nocturnal dyspnea or peripheral edema.  The patient has not had cardiac catheterization.  She did have a two- dimensional echocardiogram today, which showed normal wall thickness and vigorous left ventricular systolic function with ejection fraction 65% to 70%, and she did have evidence of diastolic dysfunction.  She also had mild mitral regurgitation.  SOCIAL HISTORY:  Reveals that she had been a widow for the past 2 years. She lives at assisted living at [facility]  She never drank alcohol. She quit smoking in 1987.  She has a son and a daughter who live  in [place].  FAMILY HISTORY:  Reveals that her mother died of bladder cancer at age 90.  Her parents were divorced and she does not know what her father died from.  REVIEW OF SYSTEMS:  The patient is not having any chills or fever.  Her weight has been reasonably steady for the past several years.  She is not having any dysuria, but does have urinary frequency.  She has had no change in bowel or bladder habits.  She has had no hematochezia or melena.  All other systems negative in detail.  PHYSICAL EXAMINATION:  VITAL SIGNS: Her blood pressure is 124/76, pulse is 80 normal sinus rhythm, O2 sat is 95% on room air.  She is afebrile. GENERAL APPEARANCE:  Reveals a thin, alert pleasant woman in no distress.  She has some decrease of recent memory. HEAD AND NECK EXAM:  Reveals the pupils were equal, round, and reactive to light and accommodation.  Extraocular movements are full.  The jugular venous pressure normal.  Carotids normal.  Thyroid normal. CHEST:  Clear. HEART:  Reveals no murmur, gallop, rub, or click.  She has a mild pectus excavatum.  ABDOMEN: Soft without hepatosplenomegaly or masses. EXTREMITIES:  Show no phlebitis or edema.  LABORATORY DATA:  Her chest x-ray shows a normal heart size and clear lung fields.  I could not find any EKG in her chart, although there is a report that it is unremarkable.  Her laboratory studies include a white count of 11,200, hemoglobin 12.9, sodium 133, potassium 3.6, BUN 15, creatinine 0.7.  Her troponins are negative x3.  CK-MBs are slightly elevated.  IMPRESSION: 1. Syncope without warning, rule out cardiac arrhythmia.  Rule out     orthostatic hypotension. 2. Questionable remote history of paroxysmal atrial fibrillation, but     without good documentation and she states that she was never placed     on Coumadin. 3. Exertional palpitations and exertional dyspnea to be characterized     further while on  telemetry.  RECOMMENDATIONS:  Check orthostatic blood pressures.  We will allow her to walk in the hall and observe for tachy palpitations.  If she shows these, we can consider adding low-dose Toprol 12.5 mg each day to see if this will help symptomatically and empirically.  We will want to follow up with an outpatient 3 or 4-week event monitor to rule out serious arrhythmias.  There is no indication for adding Coumadin at this time without documentation of atrial fibrillation.  Many thanks for the opportunity to see this pleasant woman with you.          ______________________________ Cassell Clement, M.D.     TB/MEDQ  D:  06/09/2011  T:  06/09/2011  Job:  [id number]  cc:   Nani Gasser, M.D. Fax: [phone]  [facility] 12  Electronically Signed by Cassell Clement M.D. on 06/16/2011 09:00:58 AM

## 2011-06-19 NOTE — Discharge Summary (Addendum)
NAMEAMBERLYN, MARTINEZGARCIA                 ACCOUNT NO.:  [id number]  MEDICAL RECORD NO.:  [id number]  LOCATION:                               FACILITY:  [facility]  PHYSICIAN:  Pleas Koch, MD        DATE OF BIRTH:  [birth year]  DATE OF ADMISSION:  06/08/2011 DATE OF DISCHARGE:  06/08/2011                              DISCHARGE SUMMARY   DISCHARGE DIAGNOSES: 1. Syncope likely multifactorial secondary to possible benign     paroxysmal positional vertigo - has a diagnosis of this in 2009     versus an element of orthostatic hypotension. 2. Leukocytosis which resolved on its own and is likely secondary to     chronic steroid use. 3. Rheumatoid arthritis - off leflunomide for now secondary to     possible cardiac side effects.  Continue prednisone - to follow up     with rheumatologist in about of 2 weeks. 4. Deconditioning. 5. Normal ejection fraction.  This admission's ejection fraction 65-     60% with mitral regurgitation. 6. Significant deconditioning. 7. History of diverticulosis. 8. History of hiatal hernia. 9. Possible paroxysmal atrial fibrillation ?[name]  DISCHARGE MEDICATIONS:  Are as follows; 1. Prednisone 5 mg 1 tablet q.day with meals. 2. Tylenol 650 mg 2 tablets q.6 p.r.n. 3. Gatifloxacin drops 1 drop q.i.d. 4. Meclizine 12.5 daily. 5. Fosamax 1 tablet q.7 days. 6. prednisolone (ophthalmic) in left eye 1 drop q.i.d. 7. Ultram 50 mg 1 tablet q.4 p.r.n. 8. Ketorolac ophthalmic 1 drop daily. 9. Aspirin 81 mg daily. 10.Pantoprazole 80 mg daily. 11.Calcium carbonate 1 tablet b.i.d. over-the-counter. 12.Levothyroxine 137 mcg 1 tablet daily. 13.Bromday 0.9% eyedrops in the left eye.  Her Zymaxid, her     prednisolone ophthalmic solution per request of Dr. Larina Bras, [facility]     [facility]mology, who recommended to discontinue these eyedrops for     possible cataract removal recently. 14.Folic acid. Please note, I have discontinued her Arava secondary to likely cardiac arrhythmias that  may occur secondary to this, and this will need to be discussed with the patient as an outpatient with her rheumatologist.  PERTINENT IMAGING STUDIES: 1. Two-view chest x-ray, June 08, 2011, showed no active disease,     hyperinflation. 2. No acute fracture or subluxation of CT of the head.  MRI brain done on June 08, 2011, showed; 1. No acute infarct. 2. Mild left parietal scalp soft tissue swelling.  No intracranial     hemorrhage subsequent mild global atrophy without hydrocephalus.     Subsequent erosion and dense with mild transverse ligament     hypertrophy.  Subsequent, focal anterior left frontal hyperostosis     with small meningioma without associated mass effect.  Otherwise,     no intracranial mass abnormality or lesions.  CONSULTANTS ON THIS CASE:  [facility] Dr. Patty Sermons and Dr. Fredricka Bonine.  This is a very pleasant 77 year old female who was shopping at [facility] at 10:00 a.m. and suddenly, blacked out on day of admission.  She was found by EMTs and does not have any history of frank syncope, but has had presyncope in the past.  Syncope abated when she sat down,  put her head between her legs and prior to her syncope.  The patient had been seen, Dr. Linford Arnold told her blood pressure is high, but no change in medications were undertaken.  The patient gives a history of complaining for long time that she has palpitations when she walks, does not have any exertional chest pain, but does have some shortness of breath after sitting down.  She has no known history of CHF and uses 1 pillow to sleep on and denies any paroxysmal nocturnal dyspnea or peripheral edema.  PERTINENT POSITIVES ON ADMISSION:  VITAL SIGNS:  Blood pressure 124/76, pulse 80, O2 sats 95% on room air, afebrile. GENERAL:  Thin, pleasant, and alert woman in no distress.  Pupils were round, reactive to light, noted cataract lucencies, carotids had no sounds. ABDOMEN:  Soft and  nontender.  White count was 11,200, hemoglobin 12.9, BUN and creatinine 15 and 0.7.  HOSPITAL COURSE ACCORDING TO ISSUE: 1. Syncope.  It was thought that her syncope may be multifactorial.     It could be related to cerebral ischemia.  This was unfounded at     the carotids were negative on this admission, as was her MRI and CT     of the brain.  She had an echocardiogram done as well on this     admission, which showed an EF of 65- 70% with an increased relative     contribution of atrial contraction to ventricular filling and mild     mitral regurgitation.  Cardiology was consulted in view of her     possibly having a history of atrial fibrillation and because of her     syncope and tachycardia.  They recommended initially to hydrate her, get orthostatics which were indeed positive.  PT and OT were consulted numerous times, and there assistance was appreciated.  It does appear that she does have an element of benign positional vertigo as well and Epley maneuver and various other techniques were employed with good effect.  The patient endorses in the past that she has had these issues, but does not have them for long time.  It is unclear whether she actually has a history of atrial fibrillation or not, and the patient does not have a cardiologist and has never been on Coumadin.  Nevertheless today, on day of discharge, she was doing well.  She felt lightheaded but not syncopal and was able to actually walk without real issue.  She will need to be at an Assisted Living Facility in view of her extensive deconditioning and tachycardia, and shortness of breath on walking, as I think deconditioning has caused some of these issues.  1. The patient is understanding the same, and I have voiced my     concerns to her son who is present in the room at time of     discharge.  She has had a full workup at this admission for any     occult cause of syncope or any relatively dangerous cause,  and I     think that there is likely multifactorial reasons behind her     syncope.  The patient was hydrated well with 2-3 liters of fluids a     day and orthostatics actually normalized.  She is advised not to     drink heavily water after 6:00 p.m. in view of the fact that she     does have some urinary urgency and was counseled that she could     have a  fall if she is not careful with regards to this and her     recent cataract surgery. 2. Leukocytosis.  This was thought to be secondary to her prednisone     and this resolved on its own during hospitalization. 3. Rheumatoid arthritis.  The patient was on leflunomide per     rheumatologist, and this was held because of possible cardiac side-     effects that include syncope and tachyarrhythmia.  She will need to     continue prednisone at the low dose 5 that she is on and follow up     with her rheumatologist for further issue. 4. Urinary incontinence.  Please see above.  However, the patient was     advised not to drink water heavily after 6:00 p.m. 5. Deconditioning.  The patient does have some dyspnea on exertion and     some tachycardia, and would benefit from short-term placement at a     nursing facility to rehabilitate her and make her stronger.  I do     not think that her deconditioning is something that will limit her     in long-term. 6. Normal EF of 65%-70% with mild mitral regurgitation.  It was     recommended by Cardiology, Dr. Fredricka Bonine to discontinue the     beta-blocker that was started, and avoid nodal agents as this could     precipitate syncope.  The patient was seen on the day of discharge, was doing great, had no significant issues.  She ate a full breakfast, had no burning in urine. No dyspnea, no abdominal pain, no lower leg swelling and was ready to leave.  PHYSICAL EXAMINATION:  VITAL SIGNS:  Her temperature was 97.9, pulse 62- 87, respirations was 16-18, systolic blood pressure was 138-135 over  69- 70. GENERAL:  She was sitting up in bed comfortably. CHEST:  Chest is clinically clear.  No added sounds or tactile vocal resonance or fremitus. HEART:  S1-S2, no murmurs, rubs or gallops.  On telemetry, she was in normal sinus rhythm. ABDOMEN:  Soft, nontender.  The patient was seen the patient was seen at bedside and subsequently discharged home.  It was pleasure taking care of this patient.          ______________________________ Pleas Koch, MD     JS/MEDQ  D:  06/11/2011  T:  06/11/2011  Job:  [id number]  cc:   Cassell Clement, M.D. Fax: [phone]  Nani Gasser, M.D. Fax: [phone]  Electronically Signed by Pleas Koch MD on 07/01/2011 05:17:17 AM

## 2011-07-04 NOTE — H&P (Signed)
NAMEMarland Neal  [name], SON NO.:  [id number]  MEDICAL RECORD NO.:  [id number]  LOCATION:                               FACILITY:  [facility]  PHYSICIAN:  Jonny Ruiz, MD    DATE OF BIRTH:  1924-07-13  DATE OF ADMISSION: DATE OF DISCHARGE:                             HISTORY & PHYSICAL   PRIMARY CARE PHYSICIAN:  Nani Gasser, M.D.  CHIEF COMPLAINT:  Passed out.  HISTORY OF PRESENT ILLNESS:  The patient is an 77 year old female with a history of rheumatoid arthritis and paroxysmal atrial fibrillation, who was brought to [facility] in [facility] because of loss of consciousness and head trauma.  The patient was found with a head abrasion not requiring sutures.  She states that she was at her primary care office yesterday, feeling well but was found with an elevated blood pressure. She was not discharged on any new medications.  This morning while shopping at [employer] at around 10 a.m., she passed out and lost consciousness.  When she recuperated, she found the EMS transporting her to the hospital.  The patient had no warning symptoms before passing out.  When she woke up, she had no headache or drowsiness.  She only feels dizzy.  The patient states that she had a near-syncopal event last month as well.  She was able to sit down and put her head down and symptoms went away.  She also complains of having a constant retrosternal chest pain for the last month or so.  It was not associated to this syncopal episode.  Denies palpitations.  She has been feeling short of breath as well for the same period of time.  She has no orthopnea or nocturia.  No peripheral edema.  The patient was initially evaluated at [facility] in [facility] and underwent a workup that included blood work and CT scan of the head all of which was unremarkable except for a white count of 11.2 and a CK-MB of 5.0. Troponin negative.  The patient was subsequently transferred to [facility]  for syncope workup.  PAST MEDICAL HISTORY: 1. Rheumatoid arthritis.  The patient has a rheumatologist with     [facility]. 2. Hypothyroidism. 3. Hiatal hernia. 4. Diverticulitis. 5. COPD, inactive. 6. Paroxysmal atrial fibrillation. 7. Mitral valve prolapse.  SURGICAL HISTORY:  Wrist surgery, right in 2010.  CURRENT MEDICATIONS: 1. Acetaminophen 650 mg p.o. three times a day. 2. Alendronate 70 mg p.o. every week. 3. Calcium 600/vitamin D 1 tablet b.i.d. 4. Famotidine 20 mg p.o. daily. 5. Folic acid 1 mg a day. 6. Leflunomide 10 mg daily. 7. Levothyroxine 50 mcg 1 tablet daily. 8. Prednisone 5 mg once a day.  ALLERGIES:  No known drug allergies.  FAMILY HISTORY:  Bladder cancer in her mother.  Hyperlipidemia in her sister.  Stroke in maternal aunt  SOCIAL HISTORY:  The patient has been a widow for the last 2 years.  She lives at [facility].  She quit tobacco in 1987.  She never drank alcohol.  She has 1 daughter and 1 son who live in [place].  Living will not asked.  REVIEW OF SYSTEMS:  CONSTITUTIONAL:  The patient denies fever, chills, night sweats, fatigue, malaise, or weight loss.  ENT:  Denies tinnitus or hearing loss.  No runny nose or epistaxis.  No sore throat. CARDIOVASCULAR:  See HPI.  RESPIRATORY: Denies wheezing or cough, but complains of shortness of breath at rest for the last month or so.  GI: Denies nausea, vomiting, diarrhea, constipation, hematochezia, or melena.  GU:  Denies dysuria, frequency, or hematuria.  NEUROLOGICAL: Denies headaches, drowsiness, focal weakness, numbness, or paresthesias.  PHYSICAL EXAMINATION:  VITAL SIGNS: Her blood pressure was 171/63, pulse 91, respirations 16, temperature 97.4. GENERAL APPEARANCE:  The patient is a slim Caucasian woman who appears comfortable in no distress.  She is alert and oriented x3. HEENT: She has a 3-cm abrasion on the left occipital area, which is not bleeding.   Otherwise no lacerations or asymmetry.  PERRLA, EOMI.  Oral cavity:  Normal lips, gums, and mucosa.  No lacerations in the tongue. Tongue is midline as well as the uvula. NECK:  Supple.  No carotid bruits.  Soft, nontender. HEART:  Regular S1-S2 without gallops, murmurs, or rubs. LUNGS:  Clear to auscultation. ABDOMEN:  Soft and nontender without organomegaly or masses palpable. EXTREMITIES:  She has deformities on the right hand with interphalangeal muscle atrophy and bony hypertrophy in the right wrist. NEUROLOGICAL: Cranial nerves II-XII intact.  Motor strength 5/5 in the upper and lower extremities.  Sensory is intact.  Finger-to-nose is normal.  Heel-to- shin normal.  No Babinski.  LABORATORY ASSESSMENT:  Abnormal for WBCs 11.2, MCV 102.4, sodium 133, chloride 95, CK-MB 5.0.  Troponin normal.  Ketones in the urine 15. Chest x-ray no active disease with mild hyperinflation noted.  CT head without contrast, no acute fracture or subluxation.  Scalp swelling and subcutaneous stranding in the left posterior parietal scalp.  Moderate cerebral atrophy.  EKG:  Normal sinus rhythm, QRS normal, ST and T-wave nonspecific changes.  No old EKG for comparison.  MEDICAL DECISION MAKING: 1. Syncope. 2. Head injury with abrasion. 3. Mildly leukocytosis. 4. Elevated MCV.  PLAN:  Admit the patient to medical telemetry unit.  Obtain cardiac echo and 2-D carotid Dopplers as well as cardiac enzymes q.8 x3.  Obtain B12, folate, and TSH.  Continue home medications for now.  For DVT prophylaxis, SCDs.  Omeprazole for gastric prevention.          ______________________________ Jonny Ruiz, MD     GL/MEDQ  D:  06/08/2011  T:  06/08/2011  Job:  [id number]  cc:   Nani Gasser, M.D. Fax: [phone]  Electronically Signed by Jonny Ruiz MD on 07/04/2011 09:46:45 PM

## 2011-07-30 ENCOUNTER — Ambulatory Visit: Payer: Medicare Other | Admitting: Cardiology

## 2011-08-12 ENCOUNTER — Encounter: Payer: Self-pay | Admitting: Cardiology

## 2011-08-12 ENCOUNTER — Ambulatory Visit (INDEPENDENT_AMBULATORY_CARE_PROVIDER_SITE_OTHER): Payer: Medicare Other | Admitting: Cardiology

## 2011-08-12 VITALS — BP 128/70 | HR 82 | Ht 65.0 in | Wt 99.0 lb

## 2011-08-12 DIAGNOSIS — E039 Hypothyroidism, unspecified: Secondary | ICD-10-CM

## 2011-08-12 DIAGNOSIS — R42 Dizziness and giddiness: Secondary | ICD-10-CM

## 2011-08-12 DIAGNOSIS — M069 Rheumatoid arthritis, unspecified: Secondary | ICD-10-CM

## 2011-08-12 DIAGNOSIS — R55 Syncope and collapse: Secondary | ICD-10-CM

## 2011-08-12 NOTE — Assessment & Plan Note (Signed)
Patient has a history of hypothyroidism and at this time appears to be clinically euthyroid.  Her thyroid is followed by her primary care physician.

## 2011-08-12 NOTE — Assessment & Plan Note (Signed)
The patient has a history of rheumatoid arthritis and was on methotrexate at the time of her hospitalization in October.  She is no longer on methotrexate.  Her arthritis is followed by her rheumatologist Dr. Malachi Bonds.

## 2011-08-12 NOTE — Patient Instructions (Signed)
Your physician recommends that you continue on your current medications as directed. Please refer to the Current Medication list given to you today. Your physician wants you to follow-up in: 4 month You will receive a reminder letter in the mail two months in advance. If you don't receive a letter, please call our office to schedule the follow-up appointment.  

## 2011-08-12 NOTE — Assessment & Plan Note (Signed)
[place] [facility] [place] her [place] the patient has received further physical therapy.  She feels that she is definitely getting stronger.  She has had no further syncopal episodes.  She still has occasional lightheaded episodes which are relieved by rest.  She denies any chest pain or symptoms of congestive heart failure.  Her electrocardiogram done today shows sinus rhythm with sinus arrhythmia and biatrial enlargement and incomplete right bundle branch block.

## 2011-08-12 NOTE — Progress Notes (Signed)
[name] Date of Birth:  Nov 13, 1923 [facility] [address]   [address] [place], [place]  [address] [phone]           Fax   [phone]  History of Present Illness: This pleasant 77 year old woman is seen for a followup office visit.  She is a resident of the assisted living facility at [facility].  We saw her at [facility] on 06/09/11 after she had been admitted by the hospitalist because of syncope.  Her workup in the hospital included an echocardiogram which showed normal ejection fraction of 65-70% with mild mitral regurgitation.  She was monitored on telemetry and did not have any arrhythmias.  Her echocardiogram does show evidence of diastolic dysfunction and the patient did appear to improve with hydration.  She had normal carotid Dopplers and had a normal MRI of the brain during that admission.  She underwent therapy with physical therapy and occupational therapy with improvement.  Current Outpatient Prescriptions  Medication Sig Dispense Refill  . acetaminophen (TYLENOL) 650 MG CR tablet Take 650 mg by mouth 3 (three) times daily.       [name] alendronate (FOSAMAX) 70 MG tablet Take 70 mg by mouth.       . Calcium Carbonate-Vitamin D (CALCIUM 600/VITAMIN D PO) Take by mouth.        . famotidine (PEPCID) 20 MG tablet Take 20 mg by mouth daily.        . folic acid (FOLVITE) 1 MG tablet       . leflunomide (ARAVA) 10 MG tablet Take 10 mg by mouth daily.        [name] levothyroxine (SYNTHROID, LEVOTHROID) 50 MCG tablet Take 1 tablet (50 mcg total) by mouth daily.  30 tablet  1  . Magnesium Oxide (PHILLIPS PO) Take by mouth.        . predniSONE (DELTASONE) 5 MG tablet       . traMADol (ULTRAM) 50 MG tablet Take 50 mg by mouth every 8 (eight) hours as needed.         Current Facility-Administered Medications  Medication Dose Route Frequency Provider Last Rate Last Dose  . NON FORMULARY 0.6 mL  0.6 mL Oral Weekly Nani Gasser, MD      .  NON FORMULARY 0.6 mL  0.6 mL Subcutaneous Weekly Nani Gasser, MD      . NON FORMULARY 0.6 mL  0.6 mL Subcutaneous Weekly Seymour Bars, DO   0.6 mL at 03/04/11 1131  . NON FORMULARY 0.6 mL  0.6 mL Subcutaneous Weekly Nani Gasser, MD      . NON FORMULARY   Injection 1 day or 1 dose Nani Gasser, MD      . NON FORMULARY   Injection 1 day or 1 dose Nani Gasser, MD      . Myriam Forehand FORMULARY   Injection 1 day or 1 dose Nani Gasser, MD      . Myriam Forehand FORMULARY   Injection 1 day or 1 dose Nani Gasser, MD      . Myriam Forehand FORMULARY   Subcutaneous Weekly Nani Gasser, MD   0.6 mL at 04/15/11 1127    No Known Allergies  Patient Active Problem List  Diagnoses  . CARCINOMA, BASAL CELL  . UNSPECIFIED HYPOTHYROIDISM  . RHEUMATOID ARTHRITIS  . ABDOMINAL PAIN    History  Smoking status  . Former Smoker  . Types: Cigarettes  Smokeless tobacco  . Former Neurosurgeon  . Quit date:  09/06/1985    History  Alcohol Use No    Family History  Problem Relation Age of Onset  . Cancer Mother     bladder  . Hyperlipidemia Sister   . Stroke Maternal Aunt     Review of Systems: Constitutional: no fever chills diaphoresis or fatigue or change in weight.  Head and neck: no hearing loss, no epistaxis, no photophobia or visual disturbance. Respiratory: No cough, shortness of breath or wheezing. Cardiovascular: No chest pain peripheral edema, palpitations. Gastrointestinal: No abdominal distention, no abdominal pain, no change in bowel habits hematochezia or melena. Genitourinary: No dysuria, no frequency, no urgency, no nocturia. Musculoskeletal:No arthralgias, no back pain, no gait disturbance or myalgias. Neurological: No dizziness, no headaches, no numbness, no seizures, no syncope, no weakness, no tremors. Hematologic: No lymphadenopathy, no easy bruising. Psychiatric: No confusion, no hallucinations, no sleep disturbance.    Physical Exam: Filed Vitals:   08/12/11  1105  BP: 128/70  Pulse: 82   the general appearance reveals an elderly frail woman in no acute distress.Pupils equal and reactive.   Extraocular Movements are full.  There is no scleral icterus.  The mouth and pharynx are normal.  The neck is supple.  The carotids reveal no bruits.  The jugular venous pressure is normal.  The thyroid is not enlarged.  There is no lymphadenopathy.  The chest is clear to percussion and auscultation. There are no rales or rhonchi. Expansion of the chest is symmetrical.  The precordium is quiet.  The first heart sound is normal.  The second heart sound is physiologically split.  There is no murmur gallop rub or click.  There is no abnormal lift or heave.  There is a mild pectus excavatum.The abdomen is soft and nontender. Bowel sounds are normal. The liver and spleen are not enlarged. There Are no abdominal masses. There are no bruits.  ExtremitiesStrength is normal and symmetrical in all extremities.  There is no lateralizing weakness.  There are no sensory deficits.  Extremities reveal no phlebitis or edema.  Pedal pulses are present.The skin is warm and dry.  There is no rash.  EKG today shows sinus rhythm with sinus arrhythmia, biatrial enlargement, an incomplete right bundle branch block   Assessment / Plan: Continue present medication.  Continue following the base of the physical therapist in terms of overall conditioning exercises to maintain her strength.  Recheck in 4 months for followup office visit and EKG, or sooner when necessary

## 2011-08-18 ENCOUNTER — Other Ambulatory Visit: Payer: Self-pay | Admitting: *Deleted

## 2011-08-18 DIAGNOSIS — R55 Syncope and collapse: Secondary | ICD-10-CM

## 2011-08-18 NOTE — Progress Notes (Signed)
Order put in for ekg 

## 2011-09-03 ENCOUNTER — Other Ambulatory Visit: Payer: Self-pay | Admitting: *Deleted

## 2011-09-03 DIAGNOSIS — I4510 Unspecified right bundle-branch block: Secondary | ICD-10-CM

## 2011-09-08 ENCOUNTER — Encounter: Payer: Self-pay | Admitting: Cardiology

## 2011-10-11 ENCOUNTER — Emergency Department (INDEPENDENT_AMBULATORY_CARE_PROVIDER_SITE_OTHER): Payer: Medicare Other

## 2011-10-11 ENCOUNTER — Emergency Department (HOSPITAL_BASED_OUTPATIENT_CLINIC_OR_DEPARTMENT_OTHER)
Admission: EM | Admit: 2011-10-11 | Discharge: 2011-10-11 | Disposition: A | Discharge status: Skilled Nursing Facility | Payer: Medicare Other | Attending: Emergency Medicine | Admitting: Emergency Medicine

## 2011-10-11 ENCOUNTER — Encounter (HOSPITAL_BASED_OUTPATIENT_CLINIC_OR_DEPARTMENT_OTHER): Payer: Self-pay | Admitting: Family Medicine

## 2011-10-11 DIAGNOSIS — W1809XA Striking against other object with subsequent fall, initial encounter: Secondary | ICD-10-CM | POA: Insufficient documentation

## 2011-10-11 DIAGNOSIS — Z79899 Other long term (current) drug therapy: Secondary | ICD-10-CM | POA: Insufficient documentation

## 2011-10-11 DIAGNOSIS — Y9210 Unspecified residential institution as the place of occurrence of the external cause: Secondary | ICD-10-CM | POA: Insufficient documentation

## 2011-10-11 DIAGNOSIS — J449 Chronic obstructive pulmonary disease, unspecified: Secondary | ICD-10-CM | POA: Insufficient documentation

## 2011-10-11 DIAGNOSIS — S0100XA Unspecified open wound of scalp, initial encounter: Secondary | ICD-10-CM | POA: Insufficient documentation

## 2011-10-11 DIAGNOSIS — W19XXXA Unspecified fall, initial encounter: Secondary | ICD-10-CM

## 2011-10-11 DIAGNOSIS — S0190XA Unspecified open wound of unspecified part of head, initial encounter: Secondary | ICD-10-CM

## 2011-10-11 DIAGNOSIS — S0990XA Unspecified injury of head, initial encounter: Secondary | ICD-10-CM | POA: Insufficient documentation

## 2011-10-11 DIAGNOSIS — Z8739 Personal history of other diseases of the musculoskeletal system and connective tissue: Secondary | ICD-10-CM | POA: Insufficient documentation

## 2011-10-11 DIAGNOSIS — J4489 Other specified chronic obstructive pulmonary disease: Secondary | ICD-10-CM | POA: Insufficient documentation

## 2011-10-11 DIAGNOSIS — E079 Disorder of thyroid, unspecified: Secondary | ICD-10-CM | POA: Insufficient documentation

## 2011-10-11 DIAGNOSIS — S0101XA Laceration without foreign body of scalp, initial encounter: Secondary | ICD-10-CM

## 2011-10-11 DIAGNOSIS — I4891 Unspecified atrial fibrillation: Secondary | ICD-10-CM | POA: Insufficient documentation

## 2011-10-11 NOTE — ED Notes (Signed)
Pt sitting at nurses station in wheelchair. Pt waiting for ptar for transport back to [facility].

## 2011-10-11 NOTE — ED Notes (Signed)
Pt requests for [facility] to be called for transport back to facility.

## 2011-10-11 NOTE — ED Notes (Addendum)
Pt is from [facility]. Per EMS, pt lost balance and fell and has laceration to back of head. No LOC and pt denies pain.

## 2011-10-11 NOTE — ED Provider Notes (Signed)
History     CSN: [id number]  Arrival date & time 10/11/11  1002   First MD Initiated Contact with Patient 10/11/11 1025      Chief Complaint  Patient presents with  . Fall  . Head Injury    (Consider location/radiation/quality/duration/timing/severity/associated sxs/prior treatment) HPI  Presents status post fall. Patient states that she was in the bathroom at her assisted-living facility. She states her walker was not directly in front of her. She states she lost balance and fell backwards, striking her head on the bathroom wall. She denies loss of consciousness. She denies back pain, neck pain. She denies numbness, tingling, weakness of her extremity is. She is not having pain at this time. There is minimal amount of bleeding from the site of the laceration. She states that her tetanus is up-to-date. Denies headache, dizziness, cp, palpitations, shortness of breath pre fall and currently. No neck pain or back pain. Denies hip pain.    ED Notes, ED Provider Notes from 10/11/11 0000 to 10/11/11 10:20:36       Vickie Boston Service, RN 10/11/2011 10:11      Pt is from [facility]. Per EMS, pt lost balance and fell and has laceration to back of head. No LOC and pt denies pain.      Past Medical History  Diagnosis Date  . Arthritis   . Mitral valve prolapse   . Hiatal hernia   . Diverticulitis   . COPD (chronic obstructive pulmonary disease)   . Thyroid disease   . Atrial fibrillation     Past Surgical History  Procedure Date  . Hand surgery 2010    right    Family History  Problem Relation Age of Onset  . Cancer Mother     bladder  . Hyperlipidemia Sister   . Stroke Maternal Aunt     History  Substance Use Topics  . Smoking status: Former Smoker    Types: Cigarettes  . Smokeless tobacco: Former Neurosurgeon    Quit date: 09/06/1985  . Alcohol Use: No    OB History    Grav Para Term Preterm Abortions TAB SAB Ect Mult Living                  Review of Systems  All  other systems reviewed and are negative.   except as noted HPI   Allergies  Review of patient's allergies indicates no known allergies.  Home Medications   Current Outpatient Rx  Name Route Sig Dispense Refill  . ACETAMINOPHEN ER 650 MG PO TBCR Oral Take 650 mg by mouth every 6 (six) hours as needed. For pain or fever    . ALENDRONATE SODIUM 70 MG PO TABS Oral Take 70 mg by mouth.     . ASPIRIN 81 MG PO TABS Oral Take 81 mg by mouth daily.    [name] CALCIUM CARBONATE ANTACID 500 MG PO CHEW Oral Chew 1 tablet by mouth 2 (two) times daily.    [name] FOLIC ACID 1 MG PO TABS Oral Take 1 mg by mouth daily.     [name] LEFLUNOMIDE 10 MG PO TABS Oral Take 10 mg by mouth daily.     [name] LEVOTHYROXINE SODIUM 137 MCG PO TABS Oral Take 137 mcg by mouth daily.    [name] MECLIZINE HCL 12.5 MG PO TABS Oral Take 12.5 mg by mouth daily.    [name] PANTOPRAZOLE SODIUM 40 MG PO TBEC Oral Take 80 mg by mouth daily.    [name] PREDNISONE 5  MG PO TABS Oral Take 5 mg by mouth daily.     . TRAMADOL HCL 50 MG PO TABS Oral Take 50 mg by mouth 3 (three) times daily with meals.       BP 139/61  Pulse 94  Temp(Src) 97.4 F (36.3 C) (Oral)  Resp 18  SpO2 97%  Physical Exam  Nursing note and vitals reviewed. Constitutional: She is oriented to person, place, and time. She appears well-developed.  HENT:  Head: Atraumatic.  Mouth/Throat: Oropharynx is clear and moist.  Eyes: Conjunctivae and EOM are normal. Pupils are equal, round, and reactive to light.  Neck: Normal range of motion. Neck supple.  Cardiovascular: Normal rate, regular rhythm, normal heart sounds and intact distal pulses.   Pulmonary/Chest: Effort normal and breath sounds normal. No respiratory distress. She has no wheezes. She has no rales.  Abdominal: Soft. She exhibits no distension. There is no tenderness. There is no rebound and no guarding.  Musculoskeletal: Normal range of motion. She exhibits no edema and no tenderness.       No midline c/t/l/s ttp   Neurological:  She is alert and oriented to person, place, and time.       Strength 5/5 all extremities No pronator drift No facial droop   Skin: Skin is warm and dry. No rash noted.       L posterior scalp with 2cm linear laceration  Psychiatric: She has a normal mood and affect.    ED Course  LACERATION REPAIR Date/Time: 10/11/2011 11:42 AM Performed by: Forbes Cellar Authorized by: Forbes Cellar Consent: Verbal consent obtained. Risks and benefits: risks, benefits and alternatives were discussed Consent given by: patient Patient understanding: patient states understanding of the procedure being performed Patient consent: the patient's understanding of the procedure matches consent given Procedure consent: procedure consent matches procedure scheduled Patient identity confirmed: arm band Body area: head/neck Location details: scalp Laceration length: 2 cm Foreign bodies: no foreign bodies Tendon involvement: none Nerve involvement: none Irrigation solution: saline Amount of cleaning: standard Skin closure: staples Number of sutures: 3 Approximation difficulty: simple   (including critical care time)  Labs Reviewed - No data to display Ct Head Wo Contrast  10/11/2011  *RADIOLOGY REPORT*  Clinical Data: Fall.  Head injury.  Laceration posterior head  CT HEAD WITHOUT CONTRAST  Technique:  Contiguous axial images were obtained from the base of the skull through the vertex without contrast.  Comparison: CT 06/08/2011  Findings: Negative for intracranial hemorrhage.  No fluid collection is identified.  Generalized atrophy with mild chronic microvascular ischemia.  No acute infarct or mass lesion.  Negative for skull fracture.  IMPRESSION: No acute abnormality.  Original Report Authenticated By: Camelia Phenes, M.D.   1. Fall   2. Scalp laceration     MDM  Well appearing s/p likely mechanical fall with scalp laceration. CT head negative ICH. Laceration repair, home with PMD f/u for staple  removal.          Forbes Cellar, MD 10/11/11 1143

## 2011-10-11 NOTE — ED Notes (Signed)
The patient had no problems ambulating. She took her time in getting up to make sure she was not dizzy. She stated that it felt good getting up and walking around.

## 2012-01-06 ENCOUNTER — Encounter (HOSPITAL_BASED_OUTPATIENT_CLINIC_OR_DEPARTMENT_OTHER): Payer: Self-pay | Admitting: Emergency Medicine

## 2012-01-06 ENCOUNTER — Observation Stay (HOSPITAL_COMMUNITY): Payer: Medicare Other

## 2012-01-06 ENCOUNTER — Observation Stay (HOSPITAL_BASED_OUTPATIENT_CLINIC_OR_DEPARTMENT_OTHER)
Admission: EM | Admit: 2012-01-06 | Discharge: 2012-01-08 | Disposition: A | Discharge status: Home / Self Care | Payer: Medicare Other | Attending: Internal Medicine | Admitting: Internal Medicine

## 2012-01-06 DIAGNOSIS — E039 Hypothyroidism, unspecified: Secondary | ICD-10-CM | POA: Insufficient documentation

## 2012-01-06 DIAGNOSIS — I059 Rheumatic mitral valve disease, unspecified: Secondary | ICD-10-CM | POA: Insufficient documentation

## 2012-01-06 DIAGNOSIS — R112 Nausea with vomiting, unspecified: Principal | ICD-10-CM | POA: Insufficient documentation

## 2012-01-06 DIAGNOSIS — J4489 Other specified chronic obstructive pulmonary disease: Secondary | ICD-10-CM | POA: Insufficient documentation

## 2012-01-06 DIAGNOSIS — M069 Rheumatoid arthritis, unspecified: Secondary | ICD-10-CM | POA: Insufficient documentation

## 2012-01-06 DIAGNOSIS — I951 Orthostatic hypotension: Secondary | ICD-10-CM

## 2012-01-06 DIAGNOSIS — R2681 Unsteadiness on feet: Secondary | ICD-10-CM | POA: Diagnosis present

## 2012-01-06 DIAGNOSIS — Y9210 Unspecified residential institution as the place of occurrence of the external cause: Secondary | ICD-10-CM | POA: Insufficient documentation

## 2012-01-06 DIAGNOSIS — I341 Nonrheumatic mitral (valve) prolapse: Secondary | ICD-10-CM | POA: Diagnosis present

## 2012-01-06 DIAGNOSIS — R42 Dizziness and giddiness: Secondary | ICD-10-CM | POA: Insufficient documentation

## 2012-01-06 DIAGNOSIS — R55 Syncope and collapse: Secondary | ICD-10-CM

## 2012-01-06 DIAGNOSIS — W19XXXA Unspecified fall, initial encounter: Secondary | ICD-10-CM | POA: Insufficient documentation

## 2012-01-06 DIAGNOSIS — R262 Difficulty in walking, not elsewhere classified: Secondary | ICD-10-CM | POA: Insufficient documentation

## 2012-01-06 DIAGNOSIS — J449 Chronic obstructive pulmonary disease, unspecified: Secondary | ICD-10-CM | POA: Diagnosis present

## 2012-01-06 DIAGNOSIS — K449 Diaphragmatic hernia without obstruction or gangrene: Secondary | ICD-10-CM | POA: Insufficient documentation

## 2012-01-06 DIAGNOSIS — A0811 Acute gastroenteropathy due to Norwalk agent: Secondary | ICD-10-CM

## 2012-01-06 DIAGNOSIS — M810 Age-related osteoporosis without current pathological fracture: Secondary | ICD-10-CM | POA: Insufficient documentation

## 2012-01-06 DIAGNOSIS — K529 Noninfective gastroenteritis and colitis, unspecified: Secondary | ICD-10-CM | POA: Diagnosis present

## 2012-01-06 DIAGNOSIS — K5790 Diverticulosis of intestine, part unspecified, without perforation or abscess without bleeding: Secondary | ICD-10-CM | POA: Diagnosis present

## 2012-01-06 DIAGNOSIS — M1990 Unspecified osteoarthritis, unspecified site: Secondary | ICD-10-CM | POA: Insufficient documentation

## 2012-01-06 DIAGNOSIS — E860 Dehydration: Secondary | ICD-10-CM

## 2012-01-06 DIAGNOSIS — E079 Disorder of thyroid, unspecified: Secondary | ICD-10-CM | POA: Diagnosis present

## 2012-01-06 DIAGNOSIS — I4891 Unspecified atrial fibrillation: Secondary | ICD-10-CM | POA: Diagnosis present

## 2012-01-06 DIAGNOSIS — K5792 Diverticulitis of intestine, part unspecified, without perforation or abscess without bleeding: Secondary | ICD-10-CM | POA: Insufficient documentation

## 2012-01-06 DIAGNOSIS — S40019A Contusion of unspecified shoulder, initial encounter: Secondary | ICD-10-CM | POA: Insufficient documentation

## 2012-01-06 HISTORY — DX: Dizziness and giddiness: R42

## 2012-01-06 HISTORY — DX: Rheumatoid arthritis, unspecified: M06.9

## 2012-01-06 HISTORY — DX: Diverticulosis of intestine, part unspecified, without perforation or abscess without bleeding: K57.90

## 2012-01-06 HISTORY — DX: Hypothyroidism, unspecified: E03.9

## 2012-01-06 HISTORY — DX: Orthostatic hypotension: I95.1

## 2012-01-06 HISTORY — DX: Gastro-esophageal reflux disease without esophagitis: K21.9

## 2012-01-06 LAB — CBC
MCH: 33.1 pg (ref 26.0–34.0)
MCHC: 34.6 g/dL (ref 30.0–36.0)
MCV: 95.6 fL (ref 78.0–100.0)
Platelets: 280 10*3/uL (ref 150–400)
Platelets: 248 10*3/uL (ref 150–400)
RBC: 4.59 MIL/uL (ref 3.87–5.11)
RBC: 4.17 MIL/uL (ref 3.87–5.11)
RDW: 12.9 % (ref 11.5–15.5)
WBC: 17.7 10*3/uL — ABNORMAL HIGH (ref 4.0–10.5)

## 2012-01-06 LAB — BASIC METABOLIC PANEL
BUN: 22 mg/dL (ref 6–23)
Calcium: 9.2 mg/dL (ref 8.4–10.5)
GFR calc non Af Amer: 57 mL/min — ABNORMAL LOW (ref >90)
Glucose, Bld: 153 mg/dL — ABNORMAL HIGH (ref 70–99)
Sodium: 134 mEq/L — ABNORMAL LOW (ref 135–145)

## 2012-01-06 LAB — CARDIAC PANEL(CRET KIN+CKTOT+MB+TROPI)
CK, MB: 5.1 ng/mL — ABNORMAL HIGH (ref 0.3–4.0)
Relative Index: 0.3 (ref 0.0–2.5)
Troponin I: <0.3 ng/mL (ref <0.30)

## 2012-01-06 LAB — CREATININE, SERUM
Creatinine, Ser: 0.66 mg/dL (ref 0.50–1.10)
GFR calc Af Amer: >90 mL/min (ref >90)
GFR calc non Af Amer: 78 mL/min — ABNORMAL LOW (ref >90)

## 2012-01-06 LAB — URINALYSIS, ROUTINE W REFLEX MICROSCOPIC
Bilirubin Urine: NEGATIVE
Hgb urine dipstick: NEGATIVE
Ketones, ur: 15 mg/dL — AB
Protein, ur: NEGATIVE mg/dL
Specific Gravity, Urine: 1.017 (ref 1.005–1.030)
Urobilinogen, UA: 0.2 mg/dL (ref 0.0–1.0)

## 2012-01-06 LAB — DIFFERENTIAL
Basophils Relative: 0 % (ref 0–1)
Eosinophils Absolute: 0 10*3/uL (ref 0.0–0.7)
Eosinophils Relative: 0 % (ref 0–5)
Lymphs Abs: 0.9 10*3/uL (ref 0.7–4.0)

## 2012-01-06 LAB — TSH: TSH: 0.019 u[IU]/mL — ABNORMAL LOW (ref 0.350–4.500)

## 2012-01-06 MED ORDER — BISACODYL 5 MG PO TBEC: 5.0000 mg | DELAYED_RELEASE_TABLET | Freq: Every day | ORAL | Status: DC | PRN

## 2012-01-06 MED ORDER — ONDANSETRON HCL 4 MG/2ML IJ SOLN: 4.0000 mg | Freq: Four times a day (QID) | INTRAMUSCULAR | Status: DC | PRN

## 2012-01-06 MED ORDER — LEVOTHYROXINE SODIUM 88 MCG PO TABS
88.0000 ug | ORAL_TABLET | Freq: Every day | ORAL | Status: DC
  Administered 2012-01-06 – 2012-01-08 (×3): 88 ug via ORAL
  Filled 2012-01-06 (×4): qty 1

## 2012-01-06 MED ORDER — MECLIZINE HCL 12.5 MG PO TABS
12.5000 mg | ORAL_TABLET | Freq: Two times a day (BID) | ORAL | Status: DC | PRN
  Filled 2012-01-06: qty 1

## 2012-01-06 MED ORDER — POLYVINYL ALCOHOL 1.4 % OP SOLN
2.0000 [drp] | Freq: Two times a day (BID) | OPHTHALMIC | Status: DC
Start: 2012-01-06 — End: 2012-01-08
  Administered 2012-01-06 – 2012-01-08 (×5): 2 [drp] via OPHTHALMIC
  Filled 2012-01-06: qty 15

## 2012-01-06 MED ORDER — ACETAMINOPHEN 325 MG PO TABS: 650.0000 mg | ORAL_TABLET | Freq: Four times a day (QID) | ORAL | Status: DC | PRN

## 2012-01-06 MED ORDER — SODIUM CHLORIDE 0.9 % IV SOLN
INTRAVENOUS | Status: DC
  Administered 2012-01-06: 07:00:00 via INTRAVENOUS

## 2012-01-06 MED ORDER — SODIUM CHLORIDE 0.9 % IV SOLN
INTRAVENOUS | Status: DC
  Administered 2012-01-07: 01:00:00 via INTRAVENOUS

## 2012-01-06 MED ORDER — HYPROMELLOSE (GONIOSCOPIC) 2.5 % OP SOLN: 2.0000 [drp] | Freq: Two times a day (BID) | OPHTHALMIC | Status: DC

## 2012-01-06 MED ORDER — PREDNISONE 5 MG PO TABS
5.0000 mg | ORAL_TABLET | Freq: Every day | ORAL | Status: DC
  Administered 2012-01-06 – 2012-01-08 (×3): 5 mg via ORAL
  Filled 2012-01-06 (×4): qty 1

## 2012-01-06 MED ORDER — HYDROCODONE-ACETAMINOPHEN 5-325 MG PO TABS
1.0000 | ORAL_TABLET | Freq: Three times a day (TID) | ORAL | Status: DC | PRN
  Administered 2012-01-07: 1 via ORAL
  Filled 2012-01-06 (×2): qty 1

## 2012-01-06 MED ORDER — ONDANSETRON HCL 4 MG PO TABS: 4.0000 mg | ORAL_TABLET | Freq: Four times a day (QID) | ORAL | Status: DC | PRN

## 2012-01-06 MED ORDER — HEPARIN SODIUM (PORCINE) 5000 UNIT/ML IJ SOLN
5000.0000 [IU] | Freq: Three times a day (TID) | INTRAMUSCULAR | Status: DC
  Administered 2012-01-06 – 2012-01-08 (×6): 5000 [IU] via SUBCUTANEOUS
  Filled 2012-01-06 (×9): qty 1

## 2012-01-06 MED ORDER — TERIPARATIDE (RECOMBINANT) 600 MCG/2.4ML ~~LOC~~ SOLN
20.0000 ug | Freq: Every day | SUBCUTANEOUS | Status: DC
  Filled 2012-01-06: qty 0.08

## 2012-01-06 MED ORDER — ASPIRIN EC 81 MG PO TBEC
81.0000 mg | DELAYED_RELEASE_TABLET | Freq: Every day | ORAL | Status: DC
  Administered 2012-01-06 – 2012-01-08 (×3): 81 mg via ORAL
  Filled 2012-01-06 (×4): qty 1

## 2012-01-06 MED ORDER — FOLIC ACID 1 MG PO TABS
1.0000 mg | ORAL_TABLET | Freq: Every day | ORAL | Status: DC
  Administered 2012-01-06 – 2012-01-08 (×3): 1 mg via ORAL
  Filled 2012-01-06 (×4): qty 1

## 2012-01-06 MED ORDER — PANTOPRAZOLE SODIUM 40 MG PO TBEC
80.0000 mg | DELAYED_RELEASE_TABLET | Freq: Every day | ORAL | Status: DC
  Administered 2012-01-06 – 2012-01-08 (×3): 80 mg via ORAL
  Filled 2012-01-06 (×3): qty 2

## 2012-01-06 NOTE — ED Notes (Signed)
Report given to [facility], RN Marina Gravel by Larene Beach, report called to receiving RN Diane by Aleatha Borer, RN

## 2012-01-06 NOTE — ED Notes (Signed)
Patient resting with eyes closed.  IV site clean, dry and intact.  Running NS at 250ml/hr.  Easily arouses to voice.

## 2012-01-06 NOTE — ED Provider Notes (Signed)
History     CSN: [id number]  Arrival date & time 01/06/12  4098   First MD Initiated Contact with Patient 01/06/12 [phone]      Chief Complaint  Patient presents with  . Fall    (Consider location/radiation/quality/duration/timing/severity/associated sxs/prior treatment) HPI This is an 78 year old white female who is a patient at an assisted-living facility. She has had nausea, vomiting and diarrhea for the past 2 days. She states most of the other residents at the facility and had a similar illness. She was attempting to go to the bathroom this morning and missed the seat, falling between the toilet and the cabinet. She denies injury although EMS did notice a small red spot over right posterior shoulder. She states the reason that she fell was that she is very weak. That is her principal complaint at this time.  Past Medical History  Diagnosis Date  . Arthritis   . Mitral valve prolapse   . Hiatal hernia   . Diverticulitis   . COPD (chronic obstructive pulmonary disease)   . Thyroid disease   . Atrial fibrillation   . Orthostatic hypotension   . Vertigo   . Rheumatoid arthritis   . Osteoporosis   . Diverticulosis     Past Surgical History  Procedure Date  . Hand surgery 2010    right  . Tonsillectomy     Family History  Problem Relation Age of Onset  . Cancer Mother     bladder  . Hyperlipidemia Sister   . Stroke Maternal Aunt     History  Substance Use Topics  . Smoking status: Former Smoker    Types: Cigarettes  . Smokeless tobacco: Former Neurosurgeon    Quit date: 09/06/1985  . Alcohol Use: No    OB History    Grav Para Term Preterm Abortions TAB SAB Ect Mult Living                  Review of Systems  All other systems reviewed and are negative.    Allergies  Review of patient's allergies indicates no known allergies.  Home Medications   Current Outpatient Rx  Name Route Sig Dispense Refill  . ACETAMINOPHEN ER 650 MG PO TBCR Oral Take 650 mg by  mouth every 6 (six) hours as needed. For pain or fever    . ALENDRONATE SODIUM 70 MG PO TABS Oral Take 70 mg by mouth.     . ASPIRIN 81 MG PO TABS Oral Take 81 mg by mouth daily.    [name] CALCIUM CARBONATE ANTACID 500 MG PO CHEW Oral Chew 1 tablet by mouth 2 (two) times daily.    [name] FOLIC ACID 1 MG PO TABS Oral Take 1 mg by mouth daily.     [name] HYDROCODONE-ACETAMINOPHEN 5-500 MG PO TABS Oral Take 1 tablet by mouth 3 (three) times daily.    [name] LEVOTHYROXINE SODIUM 137 MCG PO TABS Oral Take 100 mcg by mouth daily.     [name] MECLIZINE HCL 12.5 MG PO TABS Oral Take 12.5 mg by mouth daily.    [name] PANTOPRAZOLE SODIUM 40 MG PO TBEC Oral Take 80 mg by mouth daily.    [name] PREDNISONE 5 MG PO TABS Oral Take 5 mg by mouth daily.     [name] PROMETHAZINE HCL 25 MG PO TABS Oral Take 25 mg by mouth every 4 (four) hours as needed.    [name] LEFLUNOMIDE 10 MG PO TABS Oral Take 10 mg by mouth daily.     [name]  TRAMADOL HCL 50 MG PO TABS Oral Take 50 mg by mouth 3 (three) times daily with meals.       BP 118/66  Pulse 95  Temp(Src) 98.6 F (37 C) (Oral)  Ht 5\' 4"  (1.626 m)  Wt 99 lb (44.906 kg)  BMI 16.99 kg/m2  SpO2 93%  Physical Exam General: Well-developed, somewhat cachectic female in no acute distress; appearance consistent with age of record HENT: normocephalic, atraumatic Eyes: pupils equal round and reactive to light; extraocular muscles intact; lens implants Neck: supple Heart: Tachycardic; distant sounds Lungs: clear to auscultation bilaterally, distant sounds Abdomen: soft; nondistended; nontender; bowel sounds present Extremities: Chronic-appearing deformities of wrists with limited range of motion; pulses weak; nontender contused area of right posterior shoulder Neurologic: Awake, alert and oriented; motor function intact in all extremities and symmetric; no facial droop Skin: Warm and dry     ED Course  Procedures (including critical care time)     MDM  7:02 AM Patient checked out to Dr. Jeraldine Loots awake  disposition after diagnostic studies and hydration are completed.        Hanley Seamen, MD 01/06/12 [phone]

## 2012-01-06 NOTE — H&P (Signed)
History and Physical Examination  Date: 01/06/2012  Patient name: [name] Medical record number: [id number] Date of birth: 06/21/1924 Age: 78 y.o. Gender: female PCP: METHENEY,CATHERINE, MD, MD  Chief Complaint:  Chief Complaint  Patient presents with  . Fall     History of Present Illness: [name] is an 78 y.o. female with chronic AFIb, COPD, Rheumatoid Arthritis, chronic orthostatic hypotension, resident of [facility] [place] reports that she has had 3 days of n/v/d.  She has reported that there has been a small outbreak at the facility of diarrheal illness in mulitiple residents.  She reports progressive weakness.  She was getting up to go to the bathroom this morning and ended up falling down on a carpeted floor.   She was sent the the Med center at [facility] and was evaluated by the ER physicians.  They ruled out an acute fracture injury.  They became concerned because pt was found to be clinically dehydrated and a slight bump in baseline creatinine was noted.  The patient was found to have orthostatic hypotension and had some mild distress when they attempted to get her up and they have requested a hospital admission to provide the patient with more fluids and rest until she is at her baseline.  The patient responded very well to fluids and has started to feel better.  Her diarrhea has resolved now.     Past Medical History Past Medical History  Diagnosis Date  . Arthritis   . Mitral valve prolapse   . Hiatal hernia   . Diverticulitis   . COPD (chronic obstructive pulmonary disease)   . Thyroid disease   . Atrial fibrillation   . Orthostatic hypotension   . Vertigo   . Rheumatoid arthritis   . Osteoporosis   . Diverticulosis     Past Surgical History Past Surgical History  Procedure Date  . Hand surgery 2010    right  . Tonsillectomy     Home Meds: Prior to Admission medications   Medication Sig Start Date End Date Taking? Authorizing Provider    acetaminophen (TYLENOL) 650 MG CR tablet Take 650 mg by mouth every 6 (six) hours as needed. For pain or fever   Yes Historical Provider, MD  alendronate (FOSAMAX) 70 MG tablet Take 70 mg by mouth.  11/10/10  Yes Historical Provider, MD  aspirin 81 MG tablet Take 81 mg by mouth daily.   Yes Historical Provider, MD  calcium carbonate (TUMS - DOSED IN MG ELEMENTAL CALCIUM) 500 MG chewable tablet Chew 1 tablet by mouth daily.    Yes Historical Provider, MD  folic acid (FOLVITE) 1 MG tablet Take 1 mg by mouth daily.  11/17/10  Yes Historical Provider, MD  HYDROcodone-acetaminophen (VICODIN) 5-500 MG per tablet Take 1 tablet by mouth 3 (three) times daily.   Yes Historical Provider, MD  hydroxypropyl methylcellulose (ISOPTO TEARS) 2.5 % ophthalmic solution Place 2 drops into both eyes 2 (two) times daily.   Yes Historical Provider, MD  levothyroxine (SYNTHROID, LEVOTHROID) 88 MCG tablet Take 88 mcg by mouth daily.   Yes Historical Provider, MD  meclizine (ANTIVERT) 12.5 MG tablet Take 12.5 mg by mouth daily.   Yes Historical Provider, MD  pantoprazole (PROTONIX) 40 MG tablet Take 80 mg by mouth daily.   Yes Historical Provider, MD  predniSONE (DELTASONE) 5 MG tablet Take 5 mg by mouth daily.  11/17/10  Yes Historical Provider, MD  promethazine (PHENERGAN) 25 MG tablet Take 25 mg  by mouth every 4 (four) hours as needed. For nausea    Yes Historical Provider, MD  Teriparatide, Recombinant, (FORTEO Morton) Inject 20 mcg into the skin daily.   Yes Historical Provider, MD  traMADol (ULTRAM) 50 MG tablet Take 50 mg by mouth 2 (two) times daily.   Yes Historical Provider, MD    Allergies: Lactose intolerance (gi)  Social History:  History   Social History  . Marital Status: Widowed    Spouse Name: N/A    Number of Children: N/A  . Years of Education: N/A   Occupational History  . Not on file.   Social History Main Topics  . Smoking status: Former Smoker    Types: Cigarettes  . Smokeless tobacco:  Former Neurosurgeon    Quit date: 09/06/1985  . Alcohol Use: No  . Drug Use: No  . Sexually Active: Not on file   Other Topics Concern  . Not on file   Social History Narrative  . No narrative on file   Family History:  Family History  Problem Relation Age of Onset  . Cancer Mother     bladder  . Hyperlipidemia Sister   . Stroke Maternal Aunt     Review of Systems: Pertinent items are noted in HPI. All other systems reviewed and reported as negative.   Physical Exam: Blood pressure 100/51, pulse 100, temperature 98.6 F (37 C), temperature source Oral, resp. rate 20, height 5\' 4"  (1.626 m), weight 44.906 kg (99 lb), SpO2 100.00%. General appearance: alert, cooperative, appears stated age, fatigued and no distress Head: Normocephalic, without obvious abnormality, atraumatic Eyes: negative Nose: Nares normal. Septum midline. Mucosa normal. No drainage or sinus tenderness., no discharge Throat: dry membranes Neck: no adenopathy, no carotid bruit, no JVD, supple, symmetrical, trachea midline and thyroid not enlarged, symmetric, no tenderness/mass/nodules Lungs: clear to auscultation bilaterally Heart: irregularly irregular rhythm and S1, S2 normal Abdomen: soft, non-tender; bowel sounds normal; no masses,  no organomegaly Extremities: extremities normal, atraumatic, no cyanosis or edema Pulses: 2+ and symmetric Skin: Skin color, texture, turgor normal. No rashes or lesions  Lab  And Imaging results:  Results for orders placed during the hospital encounter of 01/06/12 (from the past 24 hour(s))  CBC     Status: Abnormal   Collection Time   01/06/12  6:52 AM      Component Value Range   WBC 12.3 (*) 4.0 - 10.5 (K/uL)   RBC 4.59  3.87 - 5.11 (MIL/uL)   Hemoglobin 15.2 (*) 12.0 - 15.0 (g/dL)   HCT 16.1  09.6 - 04.5 (%)   MCV 95.6  78.0 - 100.0 (fL)   MCH 33.1  26.0 - 34.0 (pg)   MCHC 34.6  30.0 - 36.0 (g/dL)   RDW 40.9  81.1 - 91.4 (%)   Platelets 280  150 - 400 (K/uL)    DIFFERENTIAL     Status: Abnormal   Collection Time   01/06/12  6:52 AM      Component Value Range   Neutrophils Relative 86 (*) 43 - 77 (%)   Neutro Abs 10.5 (*) 1.7 - 7.7 (K/uL)   Lymphocytes Relative 7 (*) 12 - 46 (%)   Lymphs Abs 0.9  0.7 - 4.0 (K/uL)   Monocytes Relative 6  3 - 12 (%)   Monocytes Absolute 0.8  0.1 - 1.0 (K/uL)   Eosinophils Relative 0  0 - 5 (%)   Eosinophils Absolute 0.0  0.0 - 0.7 (K/uL)   Basophils Relative  0  0 - 1 (%)   Basophils Absolute 0.0  0.0 - 0.1 (K/uL)  BASIC METABOLIC PANEL     Status: Abnormal   Collection Time   01/06/12  6:52 AM      Component Value Range   Sodium 134 (*) 135 - 145 (mEq/L)   Potassium 3.5  3.5 - 5.1 (mEq/L)   Chloride 93 (*) 96 - 112 (mEq/L)   CO2 25  19 - 32 (mEq/L)   Glucose, Bld 153 (*) 70 - 99 (mg/dL)   BUN 22  6 - 23 (mg/dL)   Creatinine, Ser 0.96  0.50 - 1.10 (mg/dL)   Calcium 9.2  8.4 - 04.5 (mg/dL)   GFR calc non Af Amer 57 (*) >90 (mL/min)   GFR calc Af Amer 66 (*) >90 (mL/min)  URINALYSIS, ROUTINE W REFLEX MICROSCOPIC     Status: Abnormal   Collection Time   01/06/12  7:10 AM      Component Value Range   Color, Urine YELLOW  YELLOW    APPearance CLEAR  CLEAR    Specific Gravity, Urine 1.017  1.005 - 1.030    pH 7.0  5.0 - 8.0    Glucose, UA NEGATIVE  NEGATIVE (mg/dL)   Hgb urine dipstick NEGATIVE  NEGATIVE    Bilirubin Urine NEGATIVE  NEGATIVE    Ketones, ur 15 (*) NEGATIVE (mg/dL)   Protein, ur NEGATIVE  NEGATIVE (mg/dL)   Urobilinogen, UA 0.2  0.0 - 1.0 (mg/dL)   Nitrite NEGATIVE  NEGATIVE    Leukocytes, UA NEGATIVE  NEGATIVE      Impression   *Dehydration  Gastroenteritis - suspect norovirus (currently prevalent in community)  Unspecified hypothyroidism  Rheumatoid arthritis  Syncope  Mitral valve prolapse  COPD (chronic obstructive pulmonary disease)  Thyroid disease  Atrial fibrillation (not anticoagulated because of high fall risk)  Orthostatic hypotension - chronic but exacerbated by  dehydration and gastroenteritis  Osteoporosis  Diverticulosis  Gait instability   Plan  Admit for IV fluid hydration, PT/OT consult, prn meds for nausea, diarrhea, follow electrolytes, check CXR, repeat CBC in AM, check TSH, resume home meds for chronic medical conditions.  Please see orders.   Fall Precautions.    Standley Dakins MD [facility] [place], [place] [phone] 01/06/2012, 2:19 PM

## 2012-01-06 NOTE — ED Notes (Signed)
Report recvd from [place] [place]

## 2012-01-06 NOTE — ED Notes (Signed)
Per EMS: pt from [facility], Assisted Living. Pt was ambulating to restroom this morning and became weak and fell. Pt has had N/V/D for the past two days. Pt fell onto carpeted floor. Denies LOC. Pt has redenned area to right shoulder area.

## 2012-01-06 NOTE — ED Notes (Signed)
Attempted to ambulate patient but patient became very SOB and tachycardic.  She was unable to tolerate the assessment.  Near syncopal episode witnessed by this RN and Floyde Parkins, RN.  MD made aware.

## 2012-01-06 NOTE — ED Notes (Signed)
MD at bedside. 

## 2012-01-06 NOTE — ED Notes (Signed)
Tele-bed, team 8--Dr, Laural Benes is receiving and Dr. Jeraldine Loots is sending patient for admission.

## 2012-01-06 NOTE — ED Notes (Signed)
Dr. Molpus at bedside. 

## 2012-01-06 NOTE — ED Provider Notes (Addendum)
7:43 AM Patient states that she feeling more "clear."  VS improving.  10:43 AM Patient has completed one L NS.  On re-eval she still c/o weakness.  During an attempt to have her ambulate, she was near-syncopal.  She will be admitted for further eval / management of her dehydration / weakness.  Gerhard Munch, MD 01/06/12 1044  12:20 PM Patient's c-spine reassessed (as per carelink's request).  She denies pain and moves the neck freely in all dimensions.  There is no midline c-spine ttp.  Her c-spine is clear.  Gerhard Munch, MD 01/06/12 1221

## 2012-01-06 NOTE — Progress Notes (Signed)
[name] [id number] Admission Data: 01/06/2012 5:17 PM Attending Provider: Cleora Fleet, MD  WUJ:WJXBJYNW,GNFAOZHYQ, MD, MD Consults/ Treatment Team:    [name] is a 78 y.o. female patient admitted from [facility] awake, alert  & orientated  X 3,  DNR, VSS - Blood pressure 103/64, pulse 83, temperature 98.5 F (36.9 C), temperature source Oral, resp. rate 20, height 5\' 4"  (1.626 m), weight 45.6 kg (100 lb 8.5 oz), SpO2 95.00%., O2 2 L nasal cannular, no c/o shortness of breath, no c/o chest pain, no distress noted. Tele [phone] placed and pt is currently running:normal sinus rhythm.   IV site WDL:  wrist left, condition patent and no redness with a transparent dsg that's clean dry and intact.  Allergies:   Allergies  Allergen Reactions  . Lactose Intolerance (Gi)      Past Medical History  Diagnosis Date  . Arthritis   . Mitral valve prolapse   . Hiatal hernia   . Diverticulitis   . COPD (chronic obstructive pulmonary disease)   . Thyroid disease   . Atrial fibrillation   . Orthostatic hypotension   . Vertigo   . Rheumatoid arthritis   . Osteoporosis   . Diverticulosis     History:  obtained from the patient.   Pt orientation to unit, room and routine. Information packet given to patient/family.  Admission INP armband ID verified with patient/family, and in place. SR up x 2, fall risk assessment complete with Patient and family verbalizing understanding of risks associated with falls. Pt verbalizes an understanding of how to use the call bell and to call for help before getting out of bed.  Skin, clean-dry- intact .   Pt has blanchable redness on peripheral of buttocks     Will cont to monitor and assist as needed.  Cindra Eves, RN 01/06/2012 5:17 PM

## 2012-01-07 DIAGNOSIS — A0811 Acute gastroenteropathy due to Norwalk agent: Secondary | ICD-10-CM

## 2012-01-07 DIAGNOSIS — R55 Syncope and collapse: Secondary | ICD-10-CM

## 2012-01-07 DIAGNOSIS — I951 Orthostatic hypotension: Secondary | ICD-10-CM

## 2012-01-07 DIAGNOSIS — E860 Dehydration: Secondary | ICD-10-CM

## 2012-01-07 LAB — BASIC METABOLIC PANEL
BUN: 11 mg/dL (ref 6–23)
CO2: 22 mEq/L (ref 19–32)
Calcium: 7.7 mg/dL — ABNORMAL LOW (ref 8.4–10.5)
Chloride: 100 mEq/L (ref 96–112)
GFR calc Af Amer: >90 mL/min (ref >90)
GFR calc non Af Amer: 83 mL/min — ABNORMAL LOW (ref >90)
GFR calc non Af Amer: 84 mL/min — ABNORMAL LOW (ref >90)
Glucose, Bld: 84 mg/dL (ref 70–99)
Glucose, Bld: 126 mg/dL — ABNORMAL HIGH (ref 70–99)
Potassium: 2.8 mEq/L — ABNORMAL LOW (ref 3.5–5.1)
Potassium: 3.7 mEq/L (ref 3.5–5.1)
Sodium: 137 mEq/L (ref 135–145)
Sodium: 133 mEq/L — ABNORMAL LOW (ref 135–145)

## 2012-01-07 LAB — CBC
Hemoglobin: 11.7 g/dL — ABNORMAL LOW (ref 12.0–15.0)
MCH: 32.1 pg (ref 26.0–34.0)
Platelets: 225 10*3/uL (ref 150–400)
RBC: 3.65 MIL/uL — ABNORMAL LOW (ref 3.87–5.11)

## 2012-01-07 MED ORDER — SODIUM CHLORIDE 0.9 % IV SOLN
INTRAVENOUS | Status: AC
  Administered 2012-01-07: 09:00:00 via INTRAVENOUS
  Filled 2012-01-07 (×2): qty 1000

## 2012-01-07 MED ORDER — POTASSIUM CHLORIDE 10 MEQ/100ML IV SOLN
10.0000 meq | INTRAVENOUS | Status: AC
  Administered 2012-01-07 (×4): 10 meq via INTRAVENOUS
  Filled 2012-01-07 (×5): qty 100

## 2012-01-07 NOTE — Progress Notes (Signed)
CSW completed pt FL2 and is with weekend hand off. CSW left message for facility regarding possible return of patient. Weekend CSW will need to confirm with facility if patient can return over weekend.   .Clinical social worker continuing to follow pt to assist with pt dc plans and further csw needs.   Catha Gosselin, Theresia Majors  [phone] .01/07/2012 1920pm

## 2012-01-07 NOTE — Progress Notes (Signed)
Addendum  Patient seen and examined, chart and data base reviewed.  I agree with the above assessment and plan  For full details please see Mrs. Algis Downs PA. Note.  Clint Lipps Pager: [phone] 01/07/2012, 4:16 PM

## 2012-01-07 NOTE — Evaluation (Signed)
Physical Therapy Evaluation Patient Details Name: [name] MRN: [id number] DOB: 17-Jul-1924 Today's Date: 01/07/2012 Time: 2130-8657 PT Time Calculation (min): 27 min  PT Assessment / Plan / Recommendation Clinical Impression  Pt adm with dehydration and fall after GI illness.  Pt reports history of falls due to passing out.  Lives at ALF at [facility].  Expect pt can return to ALF at [facility] with HHPT.      PT Assessment  Patient needs continued PT services    Follow Up Recommendations  Home health PT;Supervision for mobility/OOB (at ALF)    Equipment Recommendations  None recommended by PT    Frequency Min 3X/week    Precautions / Restrictions Precautions Precautions: Fall   Pertinent Vitals/Pain Orthostatic BP's taken and in vitals (pt not orthostatic); O2 sats 94% on RA after amb - removed O2 and made nursing aware.      Mobility  Bed Mobility Bed Mobility: Supine to Sit;Sitting - Scoot to Edge of Bed Supine to Sit: 5: Supervision;HOB flat Sitting - Scoot to Edge of Bed: 5: Supervision Details for Bed Mobility Assistance: Incr time Transfers Transfers: Sit to Stand;Stand to Sit Sit to Stand: 4: Min guard;With upper extremity assist;From bed Stand to Sit: 4: Min guard;With upper extremity assist;To chair/3-in-1 Details for Transfer Assistance: Pt sat without reaching back for armrests of chair. Ambulation/Gait Ambulation/Gait Assistance: 4: Min guard Ambulation Distance (Feet): 200 Feet Assistive device: Rolling walker Ambulation/Gait Assistance Details: Cues initially to not pick walker up due to this walker different from pt's walker at home. Gait Pattern: Narrow base of support;Decreased step length - right;Decreased step length - left    Exercises     PT Goals Acute Rehab PT Goals PT Goal Formulation: With patient Time For Goal Achievement: 01/07/12 Potential to Achieve Goals: Good Pt will go Supine/Side to Sit: with modified independence PT  Goal: Supine/Side to Sit - Progress: Goal set today Pt will go Sit to Supine/Side: with modified independence PT Goal: Sit to Supine/Side - Progress: Goal set today Pt will go Sit to Stand: with modified independence PT Goal: Sit to Stand - Progress: Goal set today Pt will go Stand to Sit: with modified independence PT Goal: Stand to Sit - Progress: Goal set today Pt will Ambulate: >150 feet;with supervision;with least restrictive assistive device PT Goal: Ambulate - Progress: Goal set today  Visit Information  Last PT Received On: 01/07/12 Assistance Needed: +1    Subjective Data  Subjective: "I have had every type of therapy," pt stated. "I just pass out some times and they don't know why." Patient Stated Goal: Return to [facility]   Prior Functioning  Home Living Lives With: Other (Comment) (at [facility] ALF) Available Help at Discharge: Other (Comment) (staff at ALF) Type of Home: Apartment Home Access: Level entry Home Layout: One level Bathroom Toilet: Handicapped height Home Adaptive Equipment: Walker - four wheeled;Straight cane Prior Function Level of Independence: Independent with assistive device(s) (with gait and transfers with 4 wheeled walker) Driving: No Vocation: Retired Musician: Clinical cytogeneticist  Overall Cognitive Status: Appears within functional limits for tasks assessed/performed Arousal/Alertness: Awake/alert Orientation Level: Appears intact for tasks assessed Behavior During Session: [facility] for tasks performed    Extremity/Trunk Assessment Right Lower Extremity Assessment RLE ROM/Strength/Tone: Deficits RLE ROM/Strength/Tone Deficits: grossly 4/5 Left Lower Extremity Assessment LLE ROM/Strength/Tone: Deficits LLE ROM/Strength/Tone Deficits: grossly 4/5   Balance Static Standing Balance Static Standing - Balance Support: Bilateral upper extremity supported (on  walker) Static Standing - Level of Assistance: 5: Stand by  assistance  End of Session PT - End of Session Equipment Utilized During Treatment: Gait belt Activity Tolerance: Patient tolerated treatment well Patient left: in chair;with call bell/phone within reach Nurse Communication: Mobility status   Aya Geisel 01/07/2012, 8:31 AM  [facility] PT [phone]

## 2012-01-07 NOTE — Progress Notes (Signed)
Patient ID: [name], female   DOB: Oct 26, 1923, 78 y.o.   MRN: [id number] PATIENT DETAILS Name: [name] Age: 78 y.o. Sex: female Date of Birth: 07-Sep-1923 Admit Date: 01/06/2012 PCP: Nani Gasser, MD, MD    Interim History: Vomiting has resolved.  Loose stools this morning, but the patient reports no diarrhea.  Subjective: No complaints.  Would like to lie down in bed (has been in the chair for over an hour.)  Objective: Weight change: 0.694 kg (1 lb 8.5 oz)  Intake/Output Summary (Last 24 hours) at 01/07/12 0809 Last data filed at 01/07/12 0700  Gross per 24 hour  Intake   1625 ml  Output      1 ml  Net   1624 ml   Blood pressure 119/72, pulse 82, temperature 98 F (36.7 C), temperature source Oral, resp. rate 16, height 5\' 4"  (1.626 m), weight 45.6 kg (100 lb 8.5 oz), SpO2 98.00%. Filed Vitals:   01/06/12 1355 01/06/12 2300 01/07/12 0123 01/07/12 0500  BP: 103/64 92/55 117/70 119/72  Pulse: 83 70  82  Temp: 98.5 F (36.9 C) 97.3 F (36.3 C)  98 F (36.7 C)  TempSrc: Oral Axillary  Oral  Resp: 20 20  16   Height: 5\' 4"  (1.626 m)     Weight: 45.6 kg (100 lb 8.5 oz)     SpO2: 95% 97%  98%    Physical Exam: General: No acute distress, Thin, spry, and in a good humor. Lungs: Clear to auscultation bilaterally without wheezes or crackles Cardiovascular: Regular rate and rhythm without murmur gallop or rub normal S1 and S2 Abdomen: Nontender, nondistended, soft, bowel sounds positive, no rebound, no ascites, no appreciable mass Extremities: No significant cyanosis, clubbing, or edema bilateral lower extremities  Basic Metabolic Panel:  Lab 01/07/12 7829 01/06/12 1609 01/06/12 0652  NA 137 -- 134*  K 2.8* -- 3.5  CL 104 -- 93*  CO2 22 -- 25  GLUCOSE 84 -- 153*  BUN 14 -- 22  CREATININE 0.56 0.66 --  CALCIUM 7.7* -- 9.2  MG -- -- --  PHOS -- -- --   CBC:  Lab 01/07/12 0630 01/06/12 1609 01/06/12 0652  WBC 14.4* 17.7* --  NEUTROABS -- -- 10.5*    HGB 11.7* 13.5 --  HCT 35.2* 39.8 --  MCV 96.4 95.4 --  PLT 225 248 --   Cardiac Enzymes:  Lab 01/06/12 2240 01/06/12 1609  CKTOTAL 1523* 1507*  CKMB 5.1* 4.7*  CKMBINDEX -- --  TROPONINI <0.30 <0.30   Thyroid Function Tests:  Lab 01/06/12 1609  TSH 0.019*  T4TOTAL --  FREET4 --  T3FREE --  THYROIDAB --    Studies/Results: Scheduled Meds:   . aspirin EC  81 mg Oral Daily  . folic acid  1 mg Oral Daily  . heparin  5,000 Units Subcutaneous Q8H  . levothyroxine  88 mcg Oral Daily  . pantoprazole  80 mg Oral Q1200  . polyvinyl alcohol  2 drop Both Eyes BID  . predniSONE  5 mg Oral Daily  . Teriparatide (Recombinant)  20 mcg Subcutaneous Daily  . DISCONTD: hydroxypropyl methylcellulose  2 drop Both Eyes BID   Continuous Infusions:   . sodium chloride 100 mL/hr at 01/07/12 0034  . DISCONTD: sodium chloride 125 mL/hr at 01/06/12 0720   PRN Meds:.acetaminophen, bisacodyl, HYDROcodone-acetaminophen, meclizine, ondansetron (ZOFRAN) IV, ondansetron  Anti-infectives:  Anti-infectives    None      Assessment/Plan: Principal Problem:  *Dehydration Active Problems:  Unspecified  hypothyroidism  Rheumatoid arthritis  Syncope  Mitral valve prolapse  COPD (chronic obstructive pulmonary disease)  Thyroid disease  Atrial fibrillation  Orthostatic hypotension  Osteoporosis  Diverticulosis  Gastroenteritis  Gait instability   1.  N/V/D appears to be resolving nicely.  Likely viral gastro-enteritis. 2.  Elevated CK - recent fall - possibly mild rhabdo.  On IVF.  Will continue and recheck ck, ck-mb in am. 3.  Low TSH.  History of thyroid disease.  Will note in discharge and request outpatient follow up. 4.  Hypokalemia (2.8) will replete today. bmet this afternoon and in am.    DVT:  SCDs  Disposition - Likely back to [facility] in AM.    LOS: 1 day   Stephani Police 01/07/2012, 8:09 AM [phone]

## 2012-01-07 NOTE — Care Management Note (Signed)
    Page 1 of 1   01/07/2012     5:01:26 PM   CARE MANAGEMENT NOTE 01/07/2012  Patient:  [name], [name]   Account Number:  [id number]  Date Initiated:  01/07/2012  Documentation initiated by:  Donn Pierini  Subjective/Objective Assessment:   Pt admitted with dehydration     Action/Plan:   PTA pt lived at ALF- [facility]--PT eval   Anticipated DC Date:  01/08/2012   Anticipated DC Plan:  ASSISTED LIVING / REST HOME  In-house referral  Clinical Social Worker      DC Planning Services  CM consult      Choice offered to / List presented to:             Status of service:  In process, will continue to follow Medicare Important Message given?   (If response is "NO", the following Medicare IM given date fields will be blank) Date Medicare IM given:   Date Additional Medicare IM given:    Discharge Disposition:    Per UR Regulation:    If discussed at Long Length of Stay Meetings, dates discussed:    Comments:  01/07/12- 1700- Donn Pierini RN, BSN [phone] Pt to return to [facility] - per PT eval- recommending HH-PT at ALF- CSW following for d/c needs

## 2012-01-07 NOTE — Progress Notes (Signed)
Clinical Social Work Department BRIEF PSYCHOSOCIAL ASSESSMENT 01/07/2012  Patient:  [name], [name]     Account Number:  [id number]     Admit date:  01/06/2012  Clinical Social Worker:  Doree Albee  Date/Time:  01/07/2012 12:00 M  Referred by:  RN  Date Referred:  01/07/2012 Referred for  ALF Placement   Other Referral:   Interview type:  Patient Other interview type:   and patient daughter    PSYCHOSOCIAL DATA Living Status:  FACILITY Admitted from facility:   Level of care:  Assisted Living Primary support name:  [name] Primary support relationship to patient:  CHILD, ADULT Degree of support available:   strong    CURRENT CONCERNS Current Concerns  Post-Acute Placement   Other Concerns:    SOCIAL WORK ASSESSMENT / PLAN CSW met with pt and pt daughter at pt bedside to discuss pt current living environment and pt dc plans. Pt shared she is a resident at [facility] and lives in assisted living. Pt states she plans to return when medically stable.   Assessment/plan status:  Other - See comment Other assessment/ plan:   dishcarge planning   Information/referral to community resources:   n/a    PATIENT'S/FAMILY'S RESPONSE TO PLAN OF CARE: Pt and Pt daughter appreciative of csw concern and support. pt is motivated to dc when medically stable.        Catha Gosselin, Theresia Majors  [phone] .01/07/2012 1700pm

## 2012-01-07 NOTE — Progress Notes (Deleted)
Clinical social worker unable to assess patient today. CSW will follow up with weekend csw. .Clinical social worker continuing to follow pt to assist with pt dc plans and further csw needs.   Catha Gosselin, Theresia Majors  [phone] .01/07/2012 1714pm

## 2012-01-07 NOTE — Progress Notes (Signed)
Utilization review complete 

## 2012-01-07 NOTE — Evaluation (Signed)
Occupational Therapy Evaluation Patient Details Name: [name] MRN: [id number] DOB: 1924-07-01 Today's Date: 01/07/2012 Time: 4034-7425 OT Time Calculation (min): 28 min  OT Assessment / Plan / Recommendation Clinical Impression  This 78 yo female admitted s/p fall and found to be dehydrated and have orthostatic hypotension thus affecting pt's ability to take care of herself at ALF. Will benefit from acute OT with follow up of HHOT at ALF.    OT Assessment  Patient needs continued OT Services    Follow Up Recommendations  Home health OT (at ALF)    Equipment Recommendations  Defer to next venue    Frequency Min 2X/week    Precautions / Restrictions Precautions Precautions: Fall Restrictions Weight Bearing Restrictions: No       ADL  Eating/Feeding: Simulated;Independent Where Assessed - Eating/Feeding: Edge of bed Grooming: Performed;Teeth care (min guard A) Where Assessed - Grooming: Standing at sink Upper Body Bathing: Simulated;Set up Where Assessed - Upper Body Bathing: Unsupported;Sitting, bed Lower Body Bathing: Simulated (min guard A) Where Assessed - Lower Body Bathing: Unsupported;Sit to stand from bed Upper Body Dressing: Simulated;Set up Where Assessed - Upper Body Dressing: Unsupported;Sitting, bed Lower Body Dressing: Performed (min guard A) Where Assessed - Lower Body Dressing: Unsupported;Sit to stand from bed Toilet Transfer: Performed (min guard A) Toilet Transfer Method: Proofreader: Regular height toilet;Grab bars Toileting - Clothing Manipulation: Performed;Independent Where Assessed - Toileting Clothing Manipulation: Standing Toileting - Hygiene: Performed;Independent Where Assessed - Toileting Hygiene: Sit on 3-in-1 or toilet Tub/Shower Transfer: Not assessed (staff at ALF always A her with this) Tub/Shower Transfer Method: Not assessed Equipment Used: Rolling walker Ambulation Related to ADLs: Min guard A ADL  Comments: She sponge baths on days that staff do not do shower with her. She puts wash basin in her 3-n-1 with water in it and washes her peri-area this way, then stands at sink to wash under her arms.    OT Goals Acute Rehab OT Goals OT Goal Formulation: With patient Time For Goal Achievement: 01/14/12 Potential to Achieve Goals: Good ADL Goals Pt Will Perform Grooming: with set-up;with supervision;Unsupported;Standing at sink (2 tasks) ADL Goal: Grooming - Progress: Goal set today Pt Will Perform Upper Body Bathing: with set-up;Unsupported (sit to stand at sink) ADL Goal: Upper Body Bathing - Progress: Goal set today Pt Will Perform Lower Body Bathing: with set-up;Unsupported (sit to stand at sink) ADL Goal: Lower Body Bathing - Progress: Goal set today Pt Will Perform Upper Body Dressing: with set-up;Unsupported;Sit to stand from chair;Sit to stand from bed ADL Goal: Upper Body Dressing - Progress: Goal set today Pt Will Perform Lower Body Dressing: with set-up;Unsupported;Sit to stand from bed;Sit to stand from chair ADL Goal: Lower Body Dressing - Progress: Goal set today Pt Will Transfer to Toilet: with supervision;Ambulation;Regular height toilet;Grab bars ADL Goal: Toilet Transfer - Progress: Goal set today Arm Goals Pt Will Perform AROM: Independently;Right upper extremity;1 set;10 reps;to maintain range of motion (shoulder ab/adduction and flex/ext) Arm Goal: AROM - Progress: Goal set today  Visit Information  Last OT Received On: 01/07/12 Assistance Needed: +1    Subjective Data  Subjective: I just can't believe I fell again Patient Stated Goal: Get back to doing everything I was doing for myself before   Prior Functioning  Home Living Lives With:  (ALF--[facility]) Available Help at Discharge:  (Staff at ALF) Type of Home: Assisted living Home Access: Level entry Home Layout: One level Bathroom Shower/Tub: Psychologist, counselling (staff shower her  2x/week) Bathroom  Toilet: Handicapped height Bathroom Accessibility: Yes How Accessible: Accessible via walker Home Adaptive Equipment: Walker - four wheeled;Straight cane;Bedside commode/3-in-1;Shower chair with back;Grab bars around toilet;Grab bars in shower Prior Function Level of Independence: Independent with assistive device(s) (3-n-1 for tolieting) Driving: No Vocation: Retired Musician: No difficulties Dominant Hand: Right    Cognition  Overall Cognitive Status: Appears within functional limits for tasks assessed/performed Arousal/Alertness: Awake/alert Orientation Level: Appears intact for tasks assessed Behavior During Session: [facility] for tasks performed    Extremity/Trunk Assessment Right Upper Extremity Assessment RUE ROM/Strength/Tone: Deficits RUE ROM/Strength/Tone Deficits: When pt fell she fell on her right shoulder RUE Sensation: WFL - Light Touch RUE Coordination: WFL - fine motor Left Upper Extremity Assessment LUE ROM/Strength/Tone: Within functional levels   Mobility Bed Mobility Bed Mobility: Supine to Sit;Sitting - Scoot to Edge of Bed Supine to Sit: 6: Modified independent (Device/Increase time);HOB flat (increased time) Sitting - Scoot to Edge of Bed: 7: Independent Transfers Transfers: Sit to Stand;Stand to Sit Sit to Stand: 4: Min guard;With upper extremity assist;From bed Stand to Sit: 4: Min guard;Without upper extremity assist;With armrests;To chair/3-in-1   Exercise    Balance    End of Session OT - End of Session Equipment Utilized During Treatment: Gait belt (RW) Activity Tolerance: Patient tolerated treatment well Patient left: in chair;with call bell/phone within reach (verbalized understanding to not get up by herself)   Evette Georges [phone] 01/07/2012, 2:56 PM

## 2012-01-08 DIAGNOSIS — A0811 Acute gastroenteropathy due to Norwalk agent: Secondary | ICD-10-CM

## 2012-01-08 DIAGNOSIS — E860 Dehydration: Secondary | ICD-10-CM

## 2012-01-08 DIAGNOSIS — R55 Syncope and collapse: Secondary | ICD-10-CM

## 2012-01-08 DIAGNOSIS — I951 Orthostatic hypotension: Secondary | ICD-10-CM

## 2012-01-08 LAB — BASIC METABOLIC PANEL
BUN: 9 mg/dL (ref 6–23)
CO2: 22 mEq/L (ref 19–32)
Chloride: 105 mEq/L (ref 96–112)
Glucose, Bld: 85 mg/dL (ref 70–99)
Potassium: 4.1 mEq/L (ref 3.5–5.1)
Sodium: 136 mEq/L (ref 135–145)

## 2012-01-08 LAB — CARDIAC PANEL(CRET KIN+CKTOT+MB+TROPI)
CK, MB: 4 ng/mL (ref 0.3–4.0)
Relative Index: 0.6 (ref 0.0–2.5)
Troponin I: <0.3 ng/mL (ref <0.30)

## 2012-01-08 LAB — CBC
HCT: 33.7 % — ABNORMAL LOW (ref 36.0–46.0)
Hemoglobin: 11.2 g/dL — ABNORMAL LOW (ref 12.0–15.0)
RBC: 3.48 MIL/uL — ABNORMAL LOW (ref 3.87–5.11)

## 2012-01-08 NOTE — Discharge Summary (Signed)
HOSPITAL DISCHARGE SUMMARY  [name]  MRN: [id number]  DOB:Jun 06, 1924  Date of Admission: 01/06/2012 Date of Discharge: 01/08/2012         LOS: 2 days   Attending Physician:  Clydia Llano A  Patient's PCP:  Nani Gasser, MD, MD  Consults: None  Discharge Diagnoses: Present on Admission:  .Unspecified hypothyroidism .Thyroid disease .Syncope .Rheumatoid arthritis .Osteoporosis .Orthostatic hypotension .Mitral valve prolapse .Diverticulosis .COPD (chronic obstructive pulmonary disease) .Atrial fibrillation .Gastroenteritis .Gait instability   Medication List  As of 01/08/2012 12:41 PM   STOP taking these medications         leflunomide 10 MG tablet      levothyroxine 137 MCG tablet         TAKE these medications         acetaminophen 650 MG CR tablet   Commonly known as: TYLENOL   Take 650 mg by mouth every 6 (six) hours as needed. For pain or fever      alendronate 70 MG tablet   Commonly known as: FOSAMAX   Take 70 mg by mouth.      aspirin 81 MG tablet   Take 81 mg by mouth daily.      calcium carbonate 500 MG chewable tablet   Commonly known as: TUMS - dosed in mg elemental calcium   Chew 1 tablet by mouth daily.      folic acid 1 MG tablet   Commonly known as: FOLVITE   Take 1 mg by mouth daily.      FORTEO [place]   Inject 20 mcg into the skin daily.      HYDROcodone-acetaminophen 5-500 MG per tablet   Commonly known as: VICODIN   Take 1 tablet by mouth 3 (three) times daily.      hydroxypropyl methylcellulose 2.5 % ophthalmic solution   Commonly known as: ISOPTO TEARS   Place 2 drops into both eyes 2 (two) times daily.      levothyroxine 88 MCG tablet   Commonly known as: SYNTHROID, LEVOTHROID   Take 88 mcg by mouth daily.      meclizine 12.5 MG tablet   Commonly known as: ANTIVERT   Take 12.5 mg by mouth daily.      pantoprazole 40 MG tablet   Commonly known as: PROTONIX   Take 80 mg by mouth daily.      predniSONE 5 MG tablet    Commonly known as: DELTASONE   Take 5 mg by mouth daily.      promethazine 25 MG tablet   Commonly known as: PHENERGAN   Take 25 mg by mouth every 4 (four) hours as needed. For nausea        traMADol 50 MG tablet   Commonly known as: ULTRAM   Take 50 mg by mouth 2 (two) times daily.             Brief Admission History: [name] is an 78 y.o. female with chronic AFIb, COPD, Rheumatoid Arthritis, chronic orthostatic hypotension, resident of [facility] at [place] reports that she has had 3 days of n/v/d. She has reported that there has been a small outbreak at the facility of diarrheal illness in mulitiple residents. She reports progressive weakness. She was getting up to go to the bathroom this morning and ended up falling down on a carpeted floor. She was sent the the Med center at [facility] and was evaluated by the ER physicians. They ruled out an acute fracture injury. They became  concerned because pt was found to be clinically dehydrated and a slight bump in baseline creatinine was noted. The patient was found to have orthostatic hypotension and had some mild distress when they attempted to get her up and they have requested a hospital admission to provide the patient with more fluids and rest until she is at her baseline. The patient responded very well to fluids and has started to feel better. Her diarrhea has resolved now.   Hospital Course: Present on Admission:  .Unspecified hypothyroidism .Thyroid disease .Syncope .Rheumatoid arthritis .Osteoporosis .Orthostatic hypotension .Mitral valve prolapse .Diverticulosis .COPD (chronic obstructive pulmonary disease) .Atrial fibrillation .Gastroenteritis .Gait instability  1. Nausea/vomiting and diarrhea: This is likely to be transient viral gastroenteritis, patient treated symptomatically with antiemetics, IV fluids for hydration. Patient improved and the nausea and vomiting resolved. Patient was able to tolerate  her food.  2. Dehydration: Patient baseline creatinine is about 0.5. Patient came in with a creatinine of about 1 after gentle IV fluid hydration her renal function came back to her baseline of creatinine of 0.5  3. Hypothyroidism: Patient is getting levothyroxine supplements. When she came to the hospital here her TSH was checked it was found to be very low at the 0.019. The Synthroid dose should be decreased. There was confusion about the patient's dosage. Patient is not sure about how much she takes. There was 2 strength listed here of 88 and 137 micrograms, so was not sure which is her current dose to decrease it. I did not feel comfortable to decrease the levothyroxine further. Patient to followup with her primary care physician for further dose adjustment.  4. Rheumatoid arthritis: Patient is getting chronic prednisone and weekly methotrexate injections. She was on Leflunomide at some point but that was discontinued. Her preadmission was continued.  5. Atrial fibrillation: Patient listed as she has atrial fibrillation, her rhythm in the hospital here was sinus although time, she is not on any AV nodal blocking medication. She is on aspirin 81 mg, that was continued.  6. Fall: No prodrome suggesting other causes for the fall apart from dehydration/generalized weakness. No pain and no complaint about any painful spots.   Day of Discharge BP 151/71  Pulse 88  Temp(Src) 97.7 F (36.5 C) (Oral)  Resp 18  Ht 5\' 4"  (1.626 m)  Wt 45.6 kg (100 lb 8.5 oz)  BMI 17.26 kg/m2  SpO2 93% Physical Exam: GEN: No acute distress, cooperative with exam PSYCH: alert and oriented x4; does not appear anxious does not appear depressed; affect is normal  HEENT: Mucous membranes pink and anicteric;  Mouth: without oral thrush or lesions Eyes: PERRLA; EOM intact;  Neck: no cervical lymphadenopathy nor thyromegaly or carotid bruit; no JVD;  CHEST WALL: No tenderness, symmetrical to breathing  bilaterally CHEST: Normal respiration, clear to auscultation bilaterally  HEART: Regular rate and rhythm; no murmurs, rubs or gallops, S1 and S2 heard  BACK: No kyphosis or scoliosis; no CVA tenderness  ABDOMEN:  soft non-tender; no masses, no organomegaly, normal abdominal bowel sounds; no pannus; no intertriginous candida.  EXTREMITIES: No bone or joint deformity; no edema; no ulcerations.  PULSES: 2+ and symmetric, neurovascularity is intact SKIN: Normal hydration no rash or ulceration, no flushing or suspicious lesions  CNS: Cranial nerves 2-12 grossly intact no focal neurologic deficit, coordination is intact gait not tested    Results for orders placed during the hospital encounter of 01/06/12 (from the past 24 hour(s))  BASIC METABOLIC PANEL     Status:  Abnormal   Collection Time   01/07/12  3:05 PM      Component Value Range   Sodium 133 (*) 135 - 145 (mEq/L)   Potassium 3.7  3.5 - 5.1 (mEq/L)   Chloride 100  96 - 112 (mEq/L)   CO2 23  19 - 32 (mEq/L)   Glucose, Bld 126 (*) 70 - 99 (mg/dL)   BUN 11  6 - 23 (mg/dL)   Creatinine, Ser 4.25  0.50 - 1.10 (mg/dL)   Calcium 8.3 (*) 8.4 - 10.5 (mg/dL)   GFR calc non Af Amer 84 (*) >90 (mL/min)   GFR calc Af Amer >90  >90 (mL/min)  CBC     Status: Abnormal   Collection Time   01/08/12  6:00 AM      Component Value Range   WBC 13.7 (*) 4.0 - 10.5 (K/uL)   RBC 3.48 (*) 3.87 - 5.11 (MIL/uL)   Hemoglobin 11.2 (*) 12.0 - 15.0 (g/dL)   HCT 95.6 (*) 38.7 - 46.0 (%)   MCV 96.8  78.0 - 100.0 (fL)   MCH 32.2  26.0 - 34.0 (pg)   MCHC 33.2  30.0 - 36.0 (g/dL)   RDW 56.4  33.2 - 95.1 (%)   Platelets 233  150 - 400 (K/uL)  BASIC METABOLIC PANEL     Status: Abnormal   Collection Time   01/08/12  6:00 AM      Component Value Range   Sodium 136  135 - 145 (mEq/L)   Potassium 4.1  3.5 - 5.1 (mEq/L)   Chloride 105  96 - 112 (mEq/L)   CO2 22  19 - 32 (mEq/L)   Glucose, Bld 85  70 - 99 (mg/dL)   BUN 9  6 - 23 (mg/dL)   Creatinine, Ser 8.84   0.50 - 1.10 (mg/dL)   Calcium 8.4  8.4 - 16.6 (mg/dL)   GFR calc non Af Amer 84 (*) >90 (mL/min)   GFR calc Af Amer >90  >90 (mL/min)  CARDIAC PANEL(CRET KIN+CKTOT+MB+TROPI)     Status: Abnormal   Collection Time   01/08/12  6:00 AM      Component Value Range   Total CK 618 (*) 7 - 177 (U/L)   CK, MB 4.0  0.3 - 4.0 (ng/mL)   Troponin I <0.30  <0.30 (ng/mL)   Relative Index 0.6  0.0 - 2.5     Ref. Range 01/06/2012 16:09  TSH Latest Range: 0.350-4.500 uIU/mL 0.019 (L)    Disposition: [facility] assisted[facility] facility   Follow-up Appts: Discharge Orders    Future Orders Please Complete By Expires   Increase activity slowly           I spent 40 minutes completing paperwork and coordinating discharge efforts.  SignedClydia Llano A 01/08/2012, 12:41 PM

## 2012-01-08 NOTE — Progress Notes (Signed)
CSW informed Pt ready for d/c.  Confirmed with facility and son.  MD to sign FL2 and DNR.  RN to call [facility] when paperwork is completed. Milus Banister MSW,LCSW w/e Coverage [phone]

## 2012-11-20 ENCOUNTER — Encounter (HOSPITAL_COMMUNITY): Payer: Self-pay | Admitting: Emergency Medicine

## 2012-11-20 ENCOUNTER — Emergency Department (HOSPITAL_COMMUNITY): Payer: Medicare Other

## 2012-11-20 ENCOUNTER — Observation Stay (HOSPITAL_COMMUNITY)
Admission: EM | Admit: 2012-11-20 | Discharge: 2012-11-22 | Payer: Medicare Other | Attending: Internal Medicine | Admitting: Internal Medicine

## 2012-11-20 DIAGNOSIS — C4490 Unspecified malignant neoplasm of skin, unspecified: Secondary | ICD-10-CM

## 2012-11-20 DIAGNOSIS — M810 Age-related osteoporosis without current pathological fracture: Secondary | ICD-10-CM | POA: Insufficient documentation

## 2012-11-20 DIAGNOSIS — H9190 Unspecified hearing loss, unspecified ear: Secondary | ICD-10-CM | POA: Insufficient documentation

## 2012-11-20 DIAGNOSIS — I951 Orthostatic hypotension: Secondary | ICD-10-CM

## 2012-11-20 DIAGNOSIS — K219 Gastro-esophageal reflux disease without esophagitis: Secondary | ICD-10-CM

## 2012-11-20 DIAGNOSIS — K449 Diaphragmatic hernia without obstruction or gangrene: Secondary | ICD-10-CM

## 2012-11-20 DIAGNOSIS — I341 Nonrheumatic mitral (valve) prolapse: Secondary | ICD-10-CM

## 2012-11-20 DIAGNOSIS — K5790 Diverticulosis of intestine, part unspecified, without perforation or abscess without bleeding: Secondary | ICD-10-CM

## 2012-11-20 DIAGNOSIS — I4891 Unspecified atrial fibrillation: Secondary | ICD-10-CM

## 2012-11-20 DIAGNOSIS — J4489 Other specified chronic obstructive pulmonary disease: Secondary | ICD-10-CM | POA: Insufficient documentation

## 2012-11-20 DIAGNOSIS — R112 Nausea with vomiting, unspecified: Secondary | ICD-10-CM

## 2012-11-20 DIAGNOSIS — D649 Anemia, unspecified: Secondary | ICD-10-CM

## 2012-11-20 DIAGNOSIS — R339 Retention of urine, unspecified: Secondary | ICD-10-CM

## 2012-11-20 DIAGNOSIS — M1990 Unspecified osteoarthritis, unspecified site: Secondary | ICD-10-CM

## 2012-11-20 DIAGNOSIS — K5792 Diverticulitis of intestine, part unspecified, without perforation or abscess without bleeding: Secondary | ICD-10-CM

## 2012-11-20 DIAGNOSIS — Z87891 Personal history of nicotine dependence: Secondary | ICD-10-CM | POA: Insufficient documentation

## 2012-11-20 DIAGNOSIS — R300 Dysuria: Secondary | ICD-10-CM

## 2012-11-20 DIAGNOSIS — R2681 Unsteadiness on feet: Secondary | ICD-10-CM

## 2012-11-20 DIAGNOSIS — E860 Dehydration: Secondary | ICD-10-CM

## 2012-11-20 DIAGNOSIS — R55 Syncope and collapse: Principal | ICD-10-CM

## 2012-11-20 DIAGNOSIS — R109 Unspecified abdominal pain: Secondary | ICD-10-CM

## 2012-11-20 DIAGNOSIS — R42 Dizziness and giddiness: Secondary | ICD-10-CM

## 2012-11-20 DIAGNOSIS — K529 Noninfective gastroenteritis and colitis, unspecified: Secondary | ICD-10-CM

## 2012-11-20 DIAGNOSIS — J449 Chronic obstructive pulmonary disease, unspecified: Secondary | ICD-10-CM

## 2012-11-20 DIAGNOSIS — H9319 Tinnitus, unspecified ear: Secondary | ICD-10-CM | POA: Insufficient documentation

## 2012-11-20 DIAGNOSIS — M069 Rheumatoid arthritis, unspecified: Secondary | ICD-10-CM

## 2012-11-20 DIAGNOSIS — E039 Hypothyroidism, unspecified: Secondary | ICD-10-CM

## 2012-11-20 DIAGNOSIS — E079 Disorder of thyroid, unspecified: Secondary | ICD-10-CM | POA: Diagnosis present

## 2012-11-20 DIAGNOSIS — I959 Hypotension, unspecified: Secondary | ICD-10-CM

## 2012-11-20 HISTORY — DX: Low back pain: M54.5

## 2012-11-20 HISTORY — DX: Malignant (primary) neoplasm, unspecified: C80.1

## 2012-11-20 HISTORY — DX: Other chronic pain: G89.29

## 2012-11-20 HISTORY — DX: Low back pain, unspecified: M54.50

## 2012-11-20 HISTORY — DX: Unspecified osteoarthritis, unspecified site: M19.90

## 2012-11-20 LAB — URINE MICROSCOPIC-ADD ON

## 2012-11-20 LAB — COMPREHENSIVE METABOLIC PANEL
ALT: 16 U/L (ref 0–35)
AST: 20 U/L (ref 0–37)
Alkaline Phosphatase: 72 U/L (ref 39–117)
CO2: 26 mEq/L (ref 19–32)
Calcium: 9.5 mg/dL (ref 8.4–10.5)
Chloride: 94 mEq/L — ABNORMAL LOW (ref 96–112)
GFR calc Af Amer: 60 mL/min — ABNORMAL LOW (ref >90)
GFR calc non Af Amer: 52 mL/min — ABNORMAL LOW (ref >90)
Glucose, Bld: 103 mg/dL — ABNORMAL HIGH (ref 70–99)
Sodium: 131 mEq/L — ABNORMAL LOW (ref 135–145)
Total Bilirubin: 0.3 mg/dL (ref 0.3–1.2)

## 2012-11-20 LAB — CBC WITH DIFFERENTIAL/PLATELET
Basophils Absolute: 0 10*3/uL (ref 0.0–0.1)
Eosinophils Relative: 2 % (ref 0–5)
HCT: 34.3 % — ABNORMAL LOW (ref 36.0–46.0)
Lymphocytes Relative: 24 % (ref 12–46)
Lymphs Abs: 2.3 10*3/uL (ref 0.7–4.0)
MCV: 97.7 fL (ref 78.0–100.0)
Neutro Abs: 5.5 10*3/uL (ref 1.7–7.7)
Platelets: 292 10*3/uL (ref 150–400)
RBC: 3.51 MIL/uL — ABNORMAL LOW (ref 3.87–5.11)
WBC: 9.4 10*3/uL (ref 4.0–10.5)

## 2012-11-20 LAB — URINALYSIS, ROUTINE W REFLEX MICROSCOPIC
Bilirubin Urine: NEGATIVE
Glucose, UA: NEGATIVE mg/dL
Ketones, ur: NEGATIVE mg/dL
Protein, ur: NEGATIVE mg/dL
pH: 8 (ref 5.0–8.0)

## 2012-11-20 MED ORDER — ONDANSETRON HCL 4 MG/2ML IJ SOLN: 4.0000 mg | Freq: Four times a day (QID) | INTRAMUSCULAR | Status: DC | PRN

## 2012-11-20 MED ORDER — DEXTROSE 5 % IV SOLN
1.0000 g | INTRAVENOUS | Status: DC
  Administered 2012-11-20 – 2012-11-21 (×2): 1 g via INTRAVENOUS
  Filled 2012-11-20 (×3): qty 10

## 2012-11-20 MED ORDER — ALUM & MAG HYDROXIDE-SIMETH 200-200-20 MG/5ML PO SUSP: 30.0000 mL | Freq: Four times a day (QID) | ORAL | Status: DC | PRN

## 2012-11-20 MED ORDER — MECLIZINE HCL 12.5 MG PO TABS
12.5000 mg | ORAL_TABLET | Freq: Every day | ORAL | Status: DC | PRN
  Filled 2012-11-20: qty 1

## 2012-11-20 MED ORDER — ONDANSETRON HCL 4 MG/2ML IJ SOLN
4.0000 mg | Freq: Once | INTRAMUSCULAR | Status: AC
  Administered 2012-11-20: 4 mg via INTRAVENOUS
  Filled 2012-11-20: qty 2

## 2012-11-20 MED ORDER — CALCIUM CARBONATE ANTACID 500 MG PO CHEW
1.0000 | CHEWABLE_TABLET | Freq: Every day | ORAL | Status: DC
  Administered 2012-11-20 – 2012-11-22 (×3): 200 mg via ORAL
  Filled 2012-11-20 (×3): qty 1

## 2012-11-20 MED ORDER — IOHEXOL 300 MG/ML  SOLN
80.0000 mL | Freq: Once | INTRAMUSCULAR | Status: AC | PRN
  Administered 2012-11-20: 80 mL via INTRAVENOUS

## 2012-11-20 MED ORDER — PANTOPRAZOLE SODIUM 40 MG PO TBEC
80.0000 mg | DELAYED_RELEASE_TABLET | Freq: Every day | ORAL | Status: DC
  Administered 2012-11-21 – 2012-11-22 (×2): 80 mg via ORAL
  Filled 2012-11-20: qty 1

## 2012-11-20 MED ORDER — LEVOTHYROXINE SODIUM 50 MCG PO TABS
50.0000 ug | ORAL_TABLET | Freq: Every day | ORAL | Status: DC
  Administered 2012-11-21 – 2012-11-22 (×2): 50 ug via ORAL
  Filled 2012-11-20 (×3): qty 1

## 2012-11-20 MED ORDER — IOHEXOL 300 MG/ML  SOLN
25.0000 mL | INTRAMUSCULAR | Status: AC
  Administered 2012-11-20 (×2): 25 mL via ORAL

## 2012-11-20 MED ORDER — ASPIRIN 81 MG PO CHEW
81.0000 mg | CHEWABLE_TABLET | Freq: Every day | ORAL | Status: DC
  Administered 2012-11-21 – 2012-11-22 (×2): 81 mg via ORAL
  Filled 2012-11-20 (×3): qty 1

## 2012-11-20 MED ORDER — SODIUM CHLORIDE 0.9 % IV SOLN
INTRAVENOUS | Status: AC
  Administered 2012-11-20: 18:00:00 via INTRAVENOUS

## 2012-11-20 MED ORDER — POLYVINYL ALCOHOL 1.4 % OP SOLN
2.0000 [drp] | Freq: Two times a day (BID) | OPHTHALMIC | Status: DC
  Administered 2012-11-20 – 2012-11-22 (×4): 2 [drp] via OPHTHALMIC
  Filled 2012-11-20: qty 15

## 2012-11-20 MED ORDER — ACETAMINOPHEN 325 MG PO TABS
650.0000 mg | ORAL_TABLET | Freq: Three times a day (TID) | ORAL | Status: DC
  Administered 2012-11-20 – 2012-11-22 (×5): 650 mg via ORAL
  Filled 2012-11-20 (×5): qty 2

## 2012-11-20 MED ORDER — HYDROCODONE-ACETAMINOPHEN 5-325 MG PO TABS
1.0000 | ORAL_TABLET | Freq: Three times a day (TID) | ORAL | Status: DC | PRN
  Administered 2012-11-20 (×2): 1 via ORAL
  Filled 2012-11-20 (×2): qty 1

## 2012-11-20 MED ORDER — SODIUM CHLORIDE 0.9 % IV SOLN: INTRAVENOUS | Status: DC

## 2012-11-20 MED ORDER — ENOXAPARIN SODIUM 40 MG/0.4ML ~~LOC~~ SOLN
40.0000 mg | SUBCUTANEOUS | Status: DC
  Administered 2012-11-20 – 2012-11-21 (×2): 40 mg via SUBCUTANEOUS
  Filled 2012-11-20 (×3): qty 0.4

## 2012-11-20 MED ORDER — SODIUM CHLORIDE 0.9 % IV SOLN
Freq: Once | INTRAVENOUS | Status: AC
  Administered 2012-11-20: 11:00:00 via INTRAVENOUS

## 2012-11-20 MED ORDER — DOCUSATE SODIUM 100 MG PO CAPS
100.0000 mg | ORAL_CAPSULE | Freq: Two times a day (BID) | ORAL | Status: DC
  Administered 2012-11-20 – 2012-11-22 (×3): 100 mg via ORAL
  Filled 2012-11-20 (×4): qty 1

## 2012-11-20 MED ORDER — PREDNISONE 10 MG PO TABS
10.0000 mg | ORAL_TABLET | Freq: Every day | ORAL | Status: DC
  Administered 2012-11-21 – 2012-11-22 (×2): 10 mg via ORAL
  Filled 2012-11-20 (×3): qty 1

## 2012-11-20 MED ORDER — HYDROMORPHONE HCL PF 1 MG/ML IJ SOLN: 0.5000 mg | INTRAMUSCULAR | Status: DC | PRN

## 2012-11-20 MED ORDER — FOLIC ACID 1 MG PO TABS
1.0000 mg | ORAL_TABLET | Freq: Every day | ORAL | Status: DC
  Administered 2012-11-21 – 2012-11-22 (×2): 1 mg via ORAL
  Filled 2012-11-20 (×3): qty 1

## 2012-11-20 MED ORDER — ONDANSETRON HCL 4 MG PO TABS: 4.0000 mg | ORAL_TABLET | Freq: Four times a day (QID) | ORAL | Status: DC | PRN

## 2012-11-20 MED ORDER — ONDANSETRON HCL 4 MG/2ML IJ SOLN: 4.0000 mg | Freq: Three times a day (TID) | INTRAMUSCULAR | Status: DC | PRN

## 2012-11-20 MED ORDER — TERIPARATIDE (RECOMBINANT) 600 MCG/2.4ML ~~LOC~~ SOLN
20.0000 ug | Freq: Every day | SUBCUTANEOUS | Status: DC
  Filled 2012-11-20 (×3): qty 0.08

## 2012-11-20 MED ORDER — HYPROMELLOSE (GONIOSCOPIC) 2.5 % OP SOLN
2.0000 [drp] | Freq: Two times a day (BID) | OPHTHALMIC | Status: DC
  Filled 2012-11-20: qty 15

## 2012-11-20 MED ORDER — ALBUTEROL SULFATE (5 MG/ML) 0.5% IN NEBU: 2.5000 mg | INHALATION_SOLUTION | RESPIRATORY_TRACT | Status: DC | PRN

## 2012-11-20 MED ORDER — SODIUM CHLORIDE 0.9 % IJ SOLN
3.0000 mL | Freq: Two times a day (BID) | INTRAMUSCULAR | Status: DC
  Administered 2012-11-20 – 2012-11-22 (×4): 3 mL via INTRAVENOUS

## 2012-11-20 NOTE — ED Notes (Signed)
Patient's daughter at bedside.  Daughter advised that patient had "passed out" in elevator this morning without injury.  Also advised that patient has been having a "scratchy sound in her head" for a while.  I asked patient why she had not told Dr. Manus Gunning about this and she said "I didn't think he would believe me".   Dr. Manus Gunning advised.

## 2012-11-20 NOTE — H&P (Signed)
[facility] History and Physical  [name] GNF:[id number] DOB: 30-Jun-1924 DOA: 11/20/2012  Referring physician: Dr. Glynn Octave PCP: Nani Gasser, MD  Specialists:  None  Chief Complaint: Passed out.  HPI: [name] is a 79 y.o. female ALF resident, with history of hypothyroidism, prior episodes of syncope, A. fib, RA are on prednisone, COPD, GERD, chronic abdominal pain and? Chronic tinnitus was sent from nursing facility to ED on 11/20/12 due to an episode of passing out. Patient is hard of hearing and also not to good historian. Some history is obtained from patient's daughter, [name] who is at the bedside. At approximately 9 AM today, patient was returning from breakfast and while on the elevator, suddenly passed out. No prior history of headache, palpitations or chest pain. She did endorse lightheadedness and dizziness earlier today. Nurse who is accompanying patient held her and prevented a fall. Duration of LOC is not available but was apparently brief period on waking up patient had transient slurred speech but no other features suggestive of stroke or seizure like activity. Per EMS, patient was hypotensive in the 70s. Other complaints included-intermittent dysuria approximately 3 weeks duration without fever or chills. 2-3 days history of lower abdominal pain which is nonradiating with no significant aggravating factors. This pain improved after Foley catheter placement in the ED. Some urine apparently leaked out prior to Foley placement but at least 300 mL was obtained after catheter placement. She also complains of chronic (months) history of? Diffuse versus epigastric abdominal pain with recent (? 3 weeks) intermittent nausea and vomiting without coffee grounds or blood. No diarrhea. The patient or family cannot recollect last EGD or colonoscopy. She also complains of chronic swishing sound in her head. In the ED, vital signs were stable, UA was not impressive  for UTI, lab work was remarkable for sodium 131 and mildly elevated lactate, CT head negative for acute findings and CT abdomen showed possible urinary retention without any other acute findings. Foley catheter has been placed. The hospitalist service requested to admit for further evaluation and management.    Review of Systems: all systems reviewed with patient/family and apart from history of presenting illness, are negative   Past Medical History  Diagnosis Date  . Arthritis   . Mitral valve prolapse   . Hiatal hernia   . Diverticulitis   . COPD (chronic obstructive pulmonary disease)   . Thyroid disease   . Atrial fibrillation   . Orthostatic hypotension   . Vertigo   . Rheumatoid arthritis   . Osteoporosis   . Diverticulosis   . Shortness of breath   . Hypothyroidism   . GERD (gastroesophageal reflux disease)    Past Surgical History  Procedure Laterality Date  . Hand surgery  2010    right  . Tonsillectomy    . Back surgery     Social History:  reports that she has quit smoking. Her smoking use included Cigarettes. She smoked 0.00 packs per day. She quit smokeless tobacco use about 27 years ago. She reports that she does not drink alcohol or use illicit drugs.  Resident of ALF. Widowed. Ambulates with the help of a walker.   Allergies  Allergen Reactions  . Lactose Intolerance (Gi)     Family History  Problem Relation Age of Onset  . Cancer Mother     bladder  . Hyperlipidemia Sister   . Stroke Maternal Aunt     Prior to Admission medications   Medication Sig Start  Date End Date Taking? Authorizing Provider  acetaminophen (TYLENOL) 325 MG tablet Take 650 mg by mouth 3 (three) times daily before meals.    Yes Historical Provider, MD  aspirin 81 MG chewable tablet Chew 81 mg by mouth daily.   Yes Historical Provider, MD  calcium carbonate (TUMS - DOSED IN MG ELEMENTAL CALCIUM) 500 MG chewable tablet Chew 1 tablet by mouth daily.   Yes Historical Provider, MD   docusate sodium (COLACE) 100 MG capsule Take 100 mg by mouth 2 (two) times daily.   Yes Historical Provider, MD  folic acid (FOLVITE) 1 MG tablet Take 1 mg by mouth daily.  11/17/10  Yes Historical Provider, MD  HYDROcodone-acetaminophen (NORCO/VICODIN) 5-325 MG per tablet Take 1 tablet by mouth 3 (three) times daily as needed for pain.   Yes Historical Provider, MD  hydroxypropyl methylcellulose (ISOPTO TEARS) 2.5 % ophthalmic solution Place 2 drops into both eyes 2 (two) times daily.   Yes Historical Provider, MD  levothyroxine (SYNTHROID, LEVOTHROID) 50 MCG tablet Take 50 mcg by mouth daily.   Yes Historical Provider, MD  meclizine (ANTIVERT) 12.5 MG tablet Take 12.5 mg by mouth daily as needed for dizziness.    Yes Historical Provider, MD  meloxicam (MOBIC) 7.5 MG tablet Take 7.5 mg by mouth daily.   Yes Historical Provider, MD  pantoprazole (PROTONIX) 40 MG tablet Take 80 mg by mouth daily.   Yes Historical Provider, MD  predniSONE (DELTASONE) 5 MG tablet Take 5 mg by mouth daily.  11/17/10  Yes Historical Provider, MD  promethazine (PHENERGAN) 25 MG tablet Take 25 mg by mouth every 4 (four) hours as needed. For nausea    Yes Historical Provider, MD  Teriparatide, Recombinant, (FORTEO [place]) Inject 20 mcg into the skin daily.   Yes Historical Provider, MD   Physical Exam: Filed Vitals:   11/20/12 1330 11/20/12 1445 11/20/12 1501 11/20/12 1510  BP: 143/58 169/95 169/95 163/58  Pulse: 72 93 77 67  Temp:      TempSrc:      Resp: 23 18 22    SpO2: 98% 100% 100% 100%     General exam: Moderately built and  frail female patient, lying comfortably supine on the gurney in no obvious distress.  Head, eyes and ENT: Nontraumatic and normocephalic. Pupils equally reacting to light and accommodation. Oral mucosa  slightly dry .  Neck: Supple. No JVD, carotid bruit or thyromegaly.  Lymphatics: No lymphadenopathy.  Respiratory system: Clear to auscultation. No increased work of  breathing.  Cardiovascular system: S1 and S2 heard, RRR. No JVD, murmurs, gallops, clicks or pedal edema. Telemetry shows normal sinus rhythm.   Gastrointestinal system: Abdomen is nondistended, soft. Mild epigastric tenderness but without rigidity, guarding or rebound. Normal bowel sounds heard. No organomegaly or masses appreciated.  Central nervous system: Alert and oriented to person and self . No focal neurological deficits.  Extremities: Symmetric 5 x 5 power. Peripheral pulses symmetrically felt.  Skin: No rashes or acute findings.  Musculoskeletal system: Negative exam.  Psychiatry: Pleasant and cooperative.   Labs on Admission:  Basic Metabolic Panel:  Recent Labs Lab 11/20/12 1034  NA 131*  K 3.9  CL 94*  CO2 26  GLUCOSE 103*  BUN 18  CREATININE 0.96  CALCIUM 9.5   Liver Function Tests:  Recent Labs Lab 11/20/12 1034  AST 20  ALT 16  ALKPHOS 72  BILITOT 0.3  PROT 6.5  ALBUMIN 3.5    Recent Labs Lab 11/20/12 1034  LIPASE 42  No results found for this basename: AMMONIA,  in the last 168 hours CBC:  Recent Labs Lab 11/20/12 1034  WBC 9.4  NEUTROABS 5.5  HGB 11.9*  HCT 34.3*  MCV 97.7  PLT 292   Cardiac Enzymes:  Recent Labs Lab 11/20/12 1034  TROPONINI <0.30    BNP (last 3 results) No results found for this basename: PROBNP,  in the last 8760 hours CBG: No results found for this basename: GLUCAP,  in the last 168 hours  Radiological Exams on Admission: Dg Chest 2 View  11/20/2012  *RADIOLOGY REPORT*  Clinical Data: Vomiting, back pain  CHEST - 2 VIEW  Comparison: 01/06/2012  Findings: Chronic interstitial markings/emphysematous changes.  No focal consolidation.  No pleural effusion or pneumothorax.  The heart is normal in size.  Mild degenerative changes of the visualized thoracolumbar spine. Prior vertebral augmentation at T12.  Sclerotic lesions in the left proximal humerus, unchanged from 04/08/2011.  IMPRESSION: No evidence of  acute cardiopulmonary disease.   Original Report Authenticated By: Charline Bills, M.D.    Ct Head Wo Contrast  11/20/2012  *RADIOLOGY REPORT*  Clinical Data: Dizziness, syncope  CT HEAD WITHOUT CONTRAST  Technique:  Contiguous axial images were obtained from the base of the skull through the vertex without contrast.  Comparison: 10/11/2011  Findings: No evidence of parenchymal hemorrhage or extra-axial fluid collection. No mass lesion, mass effect, or midline shift.  No CT evidence of acute infarction.  Subcortical white matter and periventricular small vessel ischemic changes.  Intracranial atherosclerosis.  Global cortical and central atrophy.  Secondary ventricular prominence.  The visualized paranasal sinuses are essentially clear. The mastoid air cells are unopacified.  No fracture  IMPRESSION: No evidence of acute intracranial abnormality.  Atrophy with small vessel ischemic changes and intracranial atherosclerosis.   Original Report Authenticated By: Charline Bills, M.D.    Ct Abdomen Pelvis W Contrast  11/20/2012  *RADIOLOGY REPORT*  Clinical Data: Lower abdominal pain and cramping.  Vomiting. Dizziness.  CT ABDOMEN AND PELVIS WITH CONTRAST  Technique:  Multidetector CT imaging of the abdomen and pelvis was performed following the standard protocol during bolus administration of intravenous contrast.  Contrast: 80mL OMNIPAQUE IOHEXOL 300 MG/ML  SOLN  Comparison: None.  Findings: The liver, gallbladder, pancreas, spleen, adrenal glands, and kidneys are normal in appearance.  Gallbladder is unremarkable. No evidence of hydronephrosis.  No soft tissue masses or lymphadenopathy identified within the abdomen or pelvis.  Urinary bladder is distended but otherwise unremarkable appearance. Uterus and adnexa are unremarkable.  No evidence of inflammatory process or abnormal fluid collections.  No evidence of inflammatory process or abnormal fluid collections.  No evidence of bowel obstruction or hernia.   Lumbar spine degenerative changes and scoliosis noted.  IMPRESSION:  1.  Distended but otherwise unremarkable appearance of the urinary bladder.  Urinary retention cannot be excluded;  clinical correlation is recommended. 2.  No other significant abnormality identified.   Original Report Authenticated By: Myles Rosenthal, M.D.     EKG: Independently reviewed. Sinus rhythm with occasional PVCs, normal axis, incomplete RBBB (not new) without acute changes.   Assessment/Plan Principal Problem:   Syncope and collapse Active Problems:   Rheumatoid arthritis   ABDOMINAL PAIN   COPD (chronic obstructive pulmonary disease)   Thyroid disease   Atrial fibrillation   Dehydration   Hypotension   Nausea and vomiting   Urinary retention   Dysuria R/O UTI   Anemia   GERD (gastroesophageal reflux disease)   1. Syncope: Possibly  secondary to hypotension/orthostatic hypotension. Her transient dysarthria was probably related to this. No focal neurological deficits and CT head is negative. Admit to telemetry. Check orthostatic blood pressures. She had been worked up for this in 2012 with normal carotid Doppler showed and echo that showed diastolic dysfunction. We'll repeat 2-D echo and carotid Dopplers. 2. Transient hypotension: Possibly secondary to dehydration from poor oral intake, nausea and vomiting versus orthostatic hypotension. Patient not on any antihypertensive medications. Will transiently increase prednisone to 10 mg daily (chronically and 5 mg daily) to cover for stressful situation. Brief IV fluids. Check orthostatic blood pressures. 3. Nausea and vomiting: Chronic versus acute on chronic.DD-gastritis, esophagitis, peptic ulcer disease, acute GE versus other etiology. Continue PPI and diet as tolerated. If symptoms persist then may consider GI consultation. 4. Dysuria R/O UTI: Although patient gives 3 week history of UTI, UA is not impressive for urinary tract infection. We'll empirically place on  Rocephin but if urine cultures are negative, recommend discontinuing all antibiotics. Recommend outpatient urology consultation given her urinary retention and dysuria. Daughter also concerned regarding bladder cancer although CT abdomen is negative. 5. Mild dehydration with hyponatremia: Secondary to nausea and vomiting. Brief IV fluids. 6. Acute urinary retention: Unclear etiology.? Secondary to abdominal pain. Continue Foley catheter for 24 hours and then try to DC. 7. History of rheumatoid arthritis: Continue prednisone. Patient apparently not on methotrexate. 8. Hypothyroid: Continue Synthroid. 9. Atrial fibrillation history: Currently in sinus rhythm. Patient not on aspirin or anticoagulation PTA. May consider aspirin, once her acute GI issues have resolved. 10. Anemia: Possibly of chronic disease. Followup CBC in a.m. 11. GERD: Continue PPI     Code Status: DO NOT RESUSCITATE   Family Communication:  Discussed with patient's daughter [name], at bedside.   Disposition Plan: Return to ALF when medically stable    Time spent: 65 minutes.  [facility] Pager [phone]  If 7PM-7AM, please contact night-coverage [email or web address] Password [facility] 11/20/2012, 4:05 PM

## 2012-11-20 NOTE — ED Notes (Signed)
Placed size 14 french cath clear yellow urine in return

## 2012-11-20 NOTE — ED Notes (Signed)
Per EMS, patient lives in assisted living.  Patient complained of nausea and vomiting yesterday.  Patient was "dizzy" earlier today.  Patient complained of R lower quadrant abdominal pain that went around to back.

## 2012-11-20 NOTE — ED Provider Notes (Signed)
History     CSN: [id number]  Arrival date & time 11/20/12  0865   First MD Initiated Contact with Patient 11/20/12 [phone]      Chief Complaint  Patient presents with  . Abdominal Pain  . Emesis    (Consider location/radiation/quality/duration/timing/severity/associated sxs/prior treatment) HPI Comments: Patient is a poor historian. She was sent from her nursing home today with apparent nausea vomiting and abdominal pain. She endorses lightheadedness and dizziness earlier today. No documented fevers. No chest pain or shortness of breath. She's complaining of very subtle lower abdominal pain previously. She was hypotensive for EMS in the 70s and now blood pressure is 148/52.  The history is provided by the EMS personnel, the patient and the nursing home. The history is limited by the condition of the patient.    Past Medical History  Diagnosis Date  . Arthritis   . Mitral valve prolapse   . Hiatal hernia   . Diverticulitis   . COPD (chronic obstructive pulmonary disease)   . Thyroid disease   . Atrial fibrillation   . Orthostatic hypotension   . Vertigo   . Rheumatoid arthritis   . Osteoporosis   . Diverticulosis   . Shortness of breath   . Hypothyroidism   . GERD (gastroesophageal reflux disease)     Past Surgical History  Procedure Laterality Date  . Hand surgery  2010    right  . Tonsillectomy    . Back surgery      Family History  Problem Relation Age of Onset  . Cancer Mother     bladder  . Hyperlipidemia Sister   . Stroke Maternal Aunt     History  Substance Use Topics  . Smoking status: Former Smoker    Types: Cigarettes  . Smokeless tobacco: Former Neurosurgeon    Quit date: 09/06/1985  . Alcohol Use: No    OB History   Grav Para Term Preterm Abortions TAB SAB Ect Mult Living                  Review of Systems  Unable to perform ROS: Dementia  Gastrointestinal: Positive for vomiting and abdominal pain.    Allergies  Lactose intolerance  (gi)  Home Medications   No current outpatient prescriptions on file.  BP 150/44  Pulse 64  Temp(Src) 97.9 F (36.6 C) (Oral)  Resp 20  Ht 5\' 5"  (1.651 m)  Wt 106 lb (48.081 kg)  BMI 17.64 kg/m2  SpO2 98%  Physical Exam  Constitutional: She appears well-developed and well-nourished. No distress.  HENT:  Head: Normocephalic and atraumatic.  Mouth/Throat: Oropharynx is clear and moist. No oropharyngeal exudate.  Eyes: Conjunctivae and EOM are normal. Pupils are equal, round, and reactive to light.  Neck: Normal range of motion. Neck supple.  Cardiovascular: Normal rate, regular rhythm and normal heart sounds.   No murmur heard. Pulmonary/Chest: Effort normal and breath sounds normal.  Abdominal: Soft. She exhibits distension. There is tenderness. There is no rebound and no guarding.  Distended abdomen with tenderness in the right lower quadrant, no guarding or rebound  Musculoskeletal: Normal range of motion. She exhibits no edema and no tenderness.  Neurological: She is alert. She exhibits normal muscle tone. Coordination normal.  Oriented to person only. Moving all extremities, follows some commands  Skin: Skin is warm.    ED Course  Procedures (including critical care time)  Labs Reviewed  CBC WITH DIFFERENTIAL - Abnormal; Notable for the following:    RBC 3.51 (*)  Hemoglobin 11.9 (*)    HCT 34.3 (*)    Monocytes Relative 15 (*)    Monocytes Absolute 1.4 (*)    All other components within normal limits  COMPREHENSIVE METABOLIC PANEL - Abnormal; Notable for the following:    Sodium 131 (*)    Chloride 94 (*)    Glucose, Bld 103 (*)    GFR calc non Af Amer 52 (*)    GFR calc Af Amer 60 (*)    All other components within normal limits  LACTIC ACID, PLASMA - Abnormal; Notable for the following:    Lactic Acid, Venous 2.7 (*)    All other components within normal limits  URINALYSIS, ROUTINE W REFLEX MICROSCOPIC - Abnormal; Notable for the following:     APPearance HAZY (*)    Hgb urine dipstick SMALL (*)    Leukocytes, UA SMALL (*)    All other components within normal limits  URINE MICROSCOPIC-ADD ON - Abnormal; Notable for the following:    Squamous Epithelial / LPF FEW (*)    All other components within normal limits  URINE CULTURE  LIPASE, BLOOD  TROPONIN I  CBC  BASIC METABOLIC PANEL   Dg Chest 2 View  11/20/2012  *RADIOLOGY REPORT*  Clinical Data: Vomiting, back pain  CHEST - 2 VIEW  Comparison: 01/06/2012  Findings: Chronic interstitial markings/emphysematous changes.  No focal consolidation.  No pleural effusion or pneumothorax.  The heart is normal in size.  Mild degenerative changes of the visualized thoracolumbar spine. Prior vertebral augmentation at T12.  Sclerotic lesions in the left proximal humerus, unchanged from 04/08/2011.  IMPRESSION: No evidence of acute cardiopulmonary disease.   Original Report Authenticated By: Charline Bills, M.D.    Ct Head Wo Contrast  11/20/2012  *RADIOLOGY REPORT*  Clinical Data: Dizziness, syncope  CT HEAD WITHOUT CONTRAST  Technique:  Contiguous axial images were obtained from the base of the skull through the vertex without contrast.  Comparison: 10/11/2011  Findings: No evidence of parenchymal hemorrhage or extra-axial fluid collection. No mass lesion, mass effect, or midline shift.  No CT evidence of acute infarction.  Subcortical white matter and periventricular small vessel ischemic changes.  Intracranial atherosclerosis.  Global cortical and central atrophy.  Secondary ventricular prominence.  The visualized paranasal sinuses are essentially clear. The mastoid air cells are unopacified.  No fracture  IMPRESSION: No evidence of acute intracranial abnormality.  Atrophy with small vessel ischemic changes and intracranial atherosclerosis.   Original Report Authenticated By: Charline Bills, M.D.    Ct Abdomen Pelvis W Contrast  11/20/2012  *RADIOLOGY REPORT*  Clinical Data: Lower abdominal  pain and cramping.  Vomiting. Dizziness.  CT ABDOMEN AND PELVIS WITH CONTRAST  Technique:  Multidetector CT imaging of the abdomen and pelvis was performed following the standard protocol during bolus administration of intravenous contrast.  Contrast: 80mL OMNIPAQUE IOHEXOL 300 MG/ML  SOLN  Comparison: None.  Findings: The liver, gallbladder, pancreas, spleen, adrenal glands, and kidneys are normal in appearance.  Gallbladder is unremarkable. No evidence of hydronephrosis.  No soft tissue masses or lymphadenopathy identified within the abdomen or pelvis.  Urinary bladder is distended but otherwise unremarkable appearance. Uterus and adnexa are unremarkable.  No evidence of inflammatory process or abnormal fluid collections.  No evidence of inflammatory process or abnormal fluid collections.  No evidence of bowel obstruction or hernia.  Lumbar spine degenerative changes and scoliosis noted.  IMPRESSION:  1.  Distended but otherwise unremarkable appearance of the urinary bladder.  Urinary retention cannot  be excluded;  clinical correlation is recommended. 2.  No other significant abnormality identified.   Original Report Authenticated By: Myles Rosenthal, M.D.      1. Syncope   2. Urinary retention   3. Nausea and vomiting   4. Syncope and collapse   5. Hypotension   6. Dysuria       MDM  One day of nausea and vomiting with lower abdominal pain. Hypotension with near syncopal episode today. Confused on initial assessment but mentation improved rapidly and patient was oriented x3. No focal neuro deficits.  Patient's daughter has arrived and stated that she had a syncopal episode and elevator today and was caught by nursing staff. She's had previous episodes of dizziness and near-syncope. She denies hitting her head. After the episode she did have slurred speech for a few minutes which is now resolved.  UA unimpressive. CT head negative. Bladder distension on CT. Foley placed. Admit for syncope workup  and IV fluids.  Likely orthostasis due to poor PO intake and nausea and vomiting.   Date: 11/20/2012  Rate: 63  Rhythm: normal sinus rhythm  QRS Axis: normal  Intervals: normal  ST/T Wave abnormalities: nonspecific ST/T changes  Conduction Disutrbances:nonspecific intraventricular conduction delay  Narrative Interpretation:   Old EKG Reviewed: none available    Glynn Octave, MD 11/20/12 1824

## 2012-11-21 DIAGNOSIS — R109 Unspecified abdominal pain: Secondary | ICD-10-CM

## 2012-11-21 DIAGNOSIS — M129 Arthropathy, unspecified: Secondary | ICD-10-CM

## 2012-11-21 DIAGNOSIS — R55 Syncope and collapse: Secondary | ICD-10-CM

## 2012-11-21 LAB — BASIC METABOLIC PANEL
BUN: 16 mg/dL (ref 6–23)
Calcium: 9.2 mg/dL (ref 8.4–10.5)
Chloride: 100 mEq/L (ref 96–112)
Creatinine, Ser: 0.87 mg/dL (ref 0.50–1.10)
GFR calc Af Amer: 68 mL/min — ABNORMAL LOW (ref >90)
GFR calc non Af Amer: 59 mL/min — ABNORMAL LOW (ref >90)

## 2012-11-21 LAB — CBC
Hemoglobin: 12 g/dL (ref 12.0–15.0)
MCHC: 34.3 g/dL (ref 30.0–36.0)
Platelets: 319 10*3/uL (ref 150–400)
RBC: 3.52 MIL/uL — ABNORMAL LOW (ref 3.87–5.11)
RDW: 12.4 % (ref 11.5–15.5)

## 2012-11-21 LAB — URINE CULTURE: Colony Count: 40000

## 2012-11-21 NOTE — Progress Notes (Signed)
  Echocardiogram 2D Echocardiogram has been performed.  Lyfe Monger FRANCES 11/21/2012, 10:52 AM

## 2012-11-21 NOTE — Progress Notes (Signed)
VASCULAR LAB PRELIMINARY  PRELIMINARY  PRELIMINARY  PRELIMINARY  Carotid duplex  completed.    Preliminary report:  Bilateral:  No evidence of hemodynamically significant internal carotid artery stenosis.   Vertebral artery flow is antegrade.      Lillard Bailon, RVT 11/21/2012, 10:44 AM

## 2012-11-21 NOTE — Progress Notes (Signed)
RN paged Dr. Irene Limbo regarding pt's complaining of severe stinging in urethral area. No sores seen, area slightly red. Dr. Irene Limbo ordered to d/c foley if bothering pt. Removing foley was discussed with pt at 0630. Around 0715, RN discussed MD's orders and assessed pt's pain. Pt stated that her pain was much better and did not want the foley discontinued. Situation reported off to day shift. Salvadore Oxford, RN [phone] 11/21/12

## 2012-11-21 NOTE — Progress Notes (Signed)
[facility]             Progress Note   Subjective: No complaints.  Objective: Vital signs in last 24 hours: Temp:  [97.4 F (36.3 C)-97.9 F (36.6 C)] 97.9 F (36.6 C) (03/18 0937) Pulse Rate:  [57-94] 79 (03/18 0937) Resp:  [16-27] 18 (03/18 0937) BP: (113-169)/(44-95) 143/53 mmHg (03/18 0937) SpO2:  [97 %-100 %] 99 % (03/18 0937) Weight:  [48.081 kg (106 lb)] 48.081 kg (106 lb) (03/17 1700) Weight change:  Last BM Date: 11/19/12  Intake/Output from previous day: 03/17 0701 - 03/18 0700 In: -  Out: 1250 [Urine:1250]     Physical Exam: General: Alert, awake, oriented x3, in no acute distress. HEENT: No bruits, no goiter. Heart: Regular rate and rhythm, without murmurs, rubs, gallops. Lungs: Clear to auscultation bilaterally. Abdomen: Soft, nontender, nondistended, positive bowel sounds. Extremities: No clubbing cyanosis or edema with positive pedal pulses. Neuro: Grossly intact, nonfocal.    Lab Results: Basic Metabolic Panel:  Recent Labs  45/40/98 1034 11/21/12 0525  NA 131* 136  K 3.9 4.5  CL 94* 100  CO2 26 27  GLUCOSE 103* 88  BUN 18 16  CREATININE 0.96 0.87  CALCIUM 9.5 9.2   Liver Function Tests:  Recent Labs  11/20/12 1034  AST 20  ALT 16  ALKPHOS 72  BILITOT 0.3  PROT 6.5  ALBUMIN 3.5    Recent Labs  11/20/12 1034  LIPASE 42   CBC:  Recent Labs  11/20/12 1034 11/21/12 0525  WBC 9.4 8.2  NEUTROABS 5.5  --   HGB 11.9* 12.0  HCT 34.3* 35.0*  MCV 97.7 99.4  PLT 292 319   Cardiac Enzymes:  Recent Labs  11/20/12 1034  TROPONINI <0.30   Urinalysis:  Recent Labs  11/20/12 1218  COLORURINE YELLOW  LABSPEC 1.010  PHURINE 8.0  GLUCOSEU NEGATIVE  HGBUR SMALL*  BILIRUBINUR NEGATIVE  KETONESUR NEGATIVE  PROTEINUR NEGATIVE  UROBILINOGEN 0.2  NITRITE NEGATIVE  LEUKOCYTESUR SMALL*    Studies/Results: Dg Chest 2 View  11/20/2012  *RADIOLOGY REPORT*  Clinical Data: Vomiting, back pain  CHEST - 2  VIEW  Comparison: 01/06/2012  Findings: Chronic interstitial markings/emphysematous changes.  No focal consolidation.  No pleural effusion or pneumothorax.  The heart is normal in size.  Mild degenerative changes of the visualized thoracolumbar spine. Prior vertebral augmentation at T12.  Sclerotic lesions in the left proximal humerus, unchanged from 04/08/2011.  IMPRESSION: No evidence of acute cardiopulmonary disease.   Original Report Authenticated By: Charline Bills, M.D.    Ct Head Wo Contrast  11/20/2012  *RADIOLOGY REPORT*  Clinical Data: Dizziness, syncope  CT HEAD WITHOUT CONTRAST  Technique:  Contiguous axial images were obtained from the base of the skull through the vertex without contrast.  Comparison: 10/11/2011  Findings: No evidence of parenchymal hemorrhage or extra-axial fluid collection. No mass lesion, mass effect, or midline shift.  No CT evidence of acute infarction.  Subcortical white matter and periventricular small vessel ischemic changes.  Intracranial atherosclerosis.  Global cortical and central atrophy.  Secondary ventricular prominence.  The visualized paranasal sinuses are essentially clear. The mastoid air cells are unopacified.  No fracture  IMPRESSION: No evidence of acute intracranial abnormality.  Atrophy with small vessel ischemic changes and intracranial atherosclerosis.   Original Report Authenticated By: Charline Bills, M.D.    Ct Abdomen Pelvis W Contrast  11/20/2012  *RADIOLOGY REPORT*  Clinical Data: Lower abdominal pain and cramping.  Vomiting. Dizziness.  CT ABDOMEN AND  PELVIS WITH CONTRAST  Technique:  Multidetector CT imaging of the abdomen and pelvis was performed following the standard protocol during bolus administration of intravenous contrast.  Contrast: 80mL OMNIPAQUE IOHEXOL 300 MG/ML  SOLN  Comparison: None.  Findings: The liver, gallbladder, pancreas, spleen, adrenal glands, and kidneys are normal in appearance.  Gallbladder is unremarkable. No  evidence of hydronephrosis.  No soft tissue masses or lymphadenopathy identified within the abdomen or pelvis.  Urinary bladder is distended but otherwise unremarkable appearance. Uterus and adnexa are unremarkable.  No evidence of inflammatory process or abnormal fluid collections.  No evidence of inflammatory process or abnormal fluid collections.  No evidence of bowel obstruction or hernia.  Lumbar spine degenerative changes and scoliosis noted.  IMPRESSION:  1.  Distended but otherwise unremarkable appearance of the urinary bladder.  Urinary retention cannot be excluded;  clinical correlation is recommended. 2.  No other significant abnormality identified.   Original Report Authenticated By: Myles Rosenthal, M.D.     Medications: Scheduled Meds: . acetaminophen  650 mg Oral TID AC  . aspirin  81 mg Oral Daily  . calcium carbonate  1 tablet Oral Daily  . cefTRIAXone (ROCEPHIN)  IV  1 g Intravenous Q24H  . docusate sodium  100 mg Oral BID  . enoxaparin (LOVENOX) injection  40 mg Subcutaneous Q24H  . folic acid  1 mg Oral Daily  . levothyroxine  50 mcg Oral QAC breakfast  . pantoprazole  80 mg Oral Daily  . polyvinyl alcohol  2 drop Both Eyes BID  . predniSONE  10 mg Oral Q breakfast  . sodium chloride  3 mL Intravenous Q12H  . Teriparatide (Recombinant)  20 mcg Subcutaneous Daily   Continuous Infusions:  PRN Meds:.albuterol, alum & mag hydroxide-simeth, HYDROcodone-acetaminophen, HYDROmorphone (DILAUDID) injection, meclizine, ondansetron (ZOFRAN) IV, ondansetron  Assessment/Plan:  Principal Problem:   Syncope and collapse Active Problems:   Rheumatoid arthritis   ABDOMINAL PAIN   COPD (chronic obstructive pulmonary disease)   Thyroid disease   Atrial fibrillation   Dehydration   Hypotension   Nausea and vomiting   Urinary retention   Dysuria R/O UTI   Anemia   GERD (gastroesophageal reflux disease)    Syncope -Etiology unclear at this time; ?from transient  hypotension. -Complete syncope workup: ECHO/doppler. -no arrhythmias seen on tele. -Anticipate DC back to ALF in am.  N/V -Resolved. -No further inpatient workup planned at this point.  ??Urinary Retention -Remove foley and start voiding trials.   Time spent coordinating care: 35 minutes.   LOS: 1 day   [facility] Pager: [phone] 11/21/2012, 12:18 PM

## 2012-11-21 NOTE — Evaluation (Signed)
Physical Therapy Evaluation Patient Details Name: [name] MRN: [id number] DOB: 08-Oct-1923 Today's Date: 11/21/2012 Time: 7829-5621 PT Time Calculation (min): 23 min  PT Assessment / Plan / Recommendation Clinical Impression  Pt is an 79 yo female admitted due to syncopal episode Pt now with generalized weakness, impaired balance, decreased activity tolerance, and mild confusion. Pt currently requires 24/7 supervision and would benefit from ST-SNF stay to achieve mod I function to return safely to ALF apartment.    PT Assessment  Patient needs continued PT services    Follow Up Recommendations  Supervision/Assistance - 24 hour;SNF    Does the patient have the potential to tolerate intense rehabilitation      Barriers to Discharge Decreased caregiver support needs more supervision than provided at ALF    Equipment Recommendations  None recommended by PT    Recommendations for Other Services     Frequency Min 3X/week    Precautions / Restrictions Precautions Precautions: Fall Restrictions Weight Bearing Restrictions: No   Pertinent Vitals/Pain Denies pain      Mobility  Bed Mobility Bed Mobility: Supine to Sit Supine to Sit: 4: Min assist;With rails;HOB flat Details for Bed Mobility Assistance: increased time, v/c's to stay on task Transfers Transfers: Sit to Stand;Stand to Sit Sit to Stand: 4: Min guard;With upper extremity assist;From bed Stand to Sit: 4: Min guard;With upper extremity assist;To chair/3-in-1 Details for Transfer Assistance: v/c's for safety and hand placement. increased time, mild unsteadiness upon intial stand Ambulation/Gait Ambulation/Gait Assistance: 4: Min guard Ambulation Distance (Feet): 150 Feet Assistive device: Rolling walker Ambulation/Gait Assistance Details: max verbal cues for safe walker management, max directional cues Gait Pattern: Step-through pattern;Decreased stride length;Narrow base of support Gait velocity: wfl for  age Stairs: No    Exercises     PT Diagnosis: Generalized weakness  PT Problem List: Decreased strength;Decreased activity tolerance;Decreased balance;Decreased mobility;Decreased cognition PT Treatment Interventions: Gait training;Functional mobility training;Therapeutic activities;Therapeutic exercise;Balance training   PT Goals Acute Rehab PT Goals PT Goal Formulation: With patient Time For Goal Achievement: 12/05/12 Potential to Achieve Goals: Good Pt will go Supine/Side to Sit: with modified independence;with HOB 0 degrees PT Goal: Supine/Side to Sit - Progress: Goal set today Pt will go Sit to Supine/Side: with modified independence;with HOB 0 degrees PT Goal: Sit to Supine/Side - Progress: Goal set today Pt will go Sit to Stand: with modified independence;with upper extremity assist (up to RW) PT Goal: Sit to Stand - Progress: Goal set today Pt will Ambulate: >150 feet;with supervision;with rolling walker PT Goal: Ambulate - Progress: Goal set today Additional Goals Additional Goal #1: Pt to toelrate 15 min of LE ther ex to improve sterngth and activity tolerance. PT Goal: Additional Goal #1 - Progress: Goal set today  Visit Information  Last PT Received On: 11/21/12 Assistance Needed: +1    Subjective Data  Subjective: Pt received supine in bed pleasant and agreeable to PT.   Prior Functioning  Home Living Type of Home: Assisted living Bathroom Shower/Tub: Walk-in shower Home Adaptive Equipment: Environmental consultant - four wheeled Prior Function Level of Independence: Needs assistance Needs Assistance: Bathing;Meal Prep Bath: Minimal (staff assists pt with shower 2x/wk) Meal Prep:  (pt goes to [employer] for all meals) Driving: No Comments: pt was amb independently with rollator t/o facility Communication Communication: No difficulties Dominant Hand: Right    Cognition  Cognition Overall Cognitive Status: Impaired Area of Impairment: Attention;Memory;Awareness of  deficits;Executive functioning;Safety/judgement Arousal/Alertness: Awake/alert Orientation Level: Oriented X4 / Intact Behavior During Session:  WFL for tasks performed Current Attention Level: Sustained Attention - Other Comments: easily distracted Memory Deficits: poor recall of how to use call light Safety/Judgement: Decreased awareness of need for assistance;Decreased awareness of safety precautions Awareness of Deficits: decreased insight Executive Functioning: pt perseverating on how much money it will cost her to go to rehab and does not want to go to rehab b/c it'll cost her money despite education on how insurance will help    Extremity/Trunk Assessment Right Upper Extremity Assessment RUE ROM/Strength/Tone: Deficits (generalized weakness) Left Upper Extremity Assessment LUE ROM/Strength/Tone: Deficits (generalized weakness) Right Lower Extremity Assessment RLE ROM/Strength/Tone: Deficits RLE ROM/Strength/Tone Deficits: generalized weakness Left Lower Extremity Assessment LLE ROM/Strength/Tone: Deficits LLE ROM/Strength/Tone Deficits: generalized weakness Trunk Assessment Trunk Assessment: Kyphotic   Balance    End of Session PT - End of Session Equipment Utilized During Treatment: Gait belt Activity Tolerance: Patient tolerated treatment well Patient left: in chair;with call bell/phone within reach Nurse Communication: Mobility status (mild confusion)  GP Functional Assessment Tool Used: clinical judgment Functional Limitation: Mobility: Walking and moving around Mobility: Walking and Moving Around Current Status ([id number]): At least 1 percent but less than 20 percent impaired, limited or restricted Mobility: Walking and Moving Around Goal Status [phone]): At least 1 percent but less than 20 percent impaired, limited or restricted   Marcene Brawn 11/21/2012, 1:52 PM   Lewis Shock, PT, DPT Pager #: [phone] Office #: [phone]

## 2012-11-22 MED ORDER — HYDROCODONE-ACETAMINOPHEN 5-325 MG PO TABS: 1.0000 | ORAL_TABLET | Freq: Three times a day (TID) | ORAL | Status: DC | PRN

## 2012-11-22 NOTE — Clinical Social Work Psychosocial (Signed)
Clinical Social Work Department BRIEF PSYCHOSOCIAL ASSESSMENT 11/22/2012  Patient:  [name], [name]     Account Number:  [id number]     Admit date:  11/20/2012  Clinical Social Worker:  Peggyann Shoals  Date/Time:  11/22/2012 05:17 PM  Referred by:  Physician  Date Referred:  11/21/2012 Referred for  Other - See comment   Other Referral:   Pt was admitted from a facility (ALF)   Interview type:  Patient Other interview type:   CSW also spoke with pt's daughter, Amil Amen.    PSYCHOSOCIAL DATA Living Status:  FACILITY Admitted from facility:  [facility] Level of care:  Assisted Living Primary support name:  Ellin Saba Primary support relationship to patient:  CHILD, ADULT Degree of support available:   very supportive.    CURRENT CONCERNS Current Concerns  Post-Acute Placement   Other Concerns:    SOCIAL WORK ASSESSMENT / PLAN CSW met with pt to address consult. CSW introduced herself and explained role of social work. CSW also explained process of returning to ALF.    Pt is a resident of [facility]. Pt has returned to baseline and is ready for discharge. PT is recommending ST-SNF, as she would benefit from rehab. Pt has not a three night qualifying stay, therefore Medicare will not SNF stay. MD ordered home health PT.    CSW spoke with pt's daughter to address consult. CSW explained 3 night qualifying stay. Pt's daughter is agreeable to discharge plan to have pt return to ALF.    Pt is ready for discharge to [facility]. Facility has reviewed clinicals and is ready for pt to return. [facility] will be trasnported by [facility]. Pt and daughter are agreeable to discharge plan. CSW is signing off as no further needs identified.   Assessment/plan status:  Psychosocial Support/Ongoing Assessment of Needs Other assessment/ plan:   Information/referral to community resources:   none at this time.    PATIENT'S/FAMILY'S RESPONSE TO PLAN OF CARE: Pt and daughter are agreeable  to discharge plan and were thankful for assistance.   Dede Query, MSW, Theresia Majors [phone]

## 2012-11-22 NOTE — Progress Notes (Signed)
   CARE MANAGEMENT NOTE 11/22/2012  Patient:  [name], [name]   Account Number:  [id number]  Date Initiated:  11/21/2012  Documentation initiated by:  [facility]  Subjective/Objective Assessment:   admitted with syncope, from ALF     Action/Plan:   return to ALF  PT eval   Anticipated DC Date:  11/22/2012   Anticipated DC Plan:  ASSISTED LIVING / REST HOME  In-house referral  Clinical Social Worker      DC Planning Services  CM consult      Choice offered to / List presented to:             Status of service:  Completed, signed off Medicare Important Message given?   (If response is "NO", the following Medicare IM given date fields will be blank) Date Medicare IM given:   Date Additional Medicare IM given:    Discharge Disposition:  ASSISTED LIVING  Per UR Regulation:  Reviewed for med. necessity/level of care/duration of stay  If discussed at Long Length of Stay Meetings, dates discussed:    Comments:  11/22/2012 1145 NCM contacted [facility] at [facility]. Spoke to rehab dept and they do offer PT, OT and SLP. Will need orders. Orders will included in package to facility. Isidoro Donning RN CCM Case Mgmt phone [phone]

## 2012-11-22 NOTE — Discharge Summary (Signed)
Physician Discharge Summary  [name] EAV:[id number] DOB: April 28, 1924 DOA: 11/20/2012  PCP: Nani Gasser, MD  Admit date: 11/20/2012 Discharge date: 11/22/2012  Time spent: 30 minutes  Recommendations for Outpatient Follow-up:  1. Follow up with PCP  Discharge Diagnoses:  Principal Problem:   Syncope and collapse Active Problems:   Rheumatoid arthritis   ABDOMINAL PAIN   COPD (chronic obstructive pulmonary disease)   Thyroid disease   Atrial fibrillation   Dehydration   Hypotension   Nausea and vomiting   Urinary retention   Dysuria R/O UTI   Anemia   GERD (gastroesophageal reflux disease)   Discharge Condition: stable  Diet recommendation: heart healthy diet  Filed Weights   11/20/12 1700 11/22/12 0500  Weight: 48.081 kg (106 lb) 49.4 kg (108 lb 14.5 oz)    History of present illness:  79 y.o. female ALF resident, with history of hypothyroidism, prior episodes of syncope, A. fib, RA are on prednisone, COPD, GERD, chronic abdominal pain and? Chronic tinnitus was sent from nursing facility to ED on 11/20/12 due to an episode of passing out. Patient is hard of hearing and also not to good historian. Some history is obtained from patient's daughter, Ms. Bevelyn Ngo who is at the bedside. At approximately 9 AM today, patient was returning from breakfast and while on the elevator, suddenly passed out. No prior history of headache, palpitations or chest pain. She did endorse lightheadedness and dizziness earlier today. Nurse who is accompanying patient held her and prevented a fall. Duration of LOC is not available but was apparently brief period on waking up patient had transient slurred speech but no other features suggestive of stroke or seizure like activity. Per EMS, patient was hypotensive in the 70s. Other complaints included-intermittent dysuria approximately 3 weeks duration without fever or chills. 2-3 days history of lower abdominal pain which is nonradiating with  no significant aggravating factors. This pain improved after Foley catheter placement in the ED. Some urine apparently leaked out prior to Foley placement but at least 300 mL was obtained after catheter placement. She also complains of chronic (months) history of? Diffuse versus epigastric abdominal pain with recent (? 3 weeks) intermittent nausea and vomiting without coffee grounds or blood. No diarrhea. The patient or family cannot recollect last EGD or colonoscopy. She also complains of chronic swishing sound in her head. In the ED, vital signs were stable, UA was not impressive for UTI, lab work was remarkable for sodium 131 and mildly elevated lactate, CT head negative for acute findings and CT abdomen showed possible urinary retention without any other acute findings. Foley catheter has been placed. The hospitalist service requested to admit for further evaluation and management.    Hospital Course:  Syncope and collapse: - Etiology unclear at this time; ? from transient hypotension. Lactic acid was mildly elevated on admission. No further episode of hypotension. - Complete syncope workup: ECHO/ 3.18.2014: no AS diastolicHF. Carotid Doppler 3.18.2014: no  ICA stenosis.  - No arrhythmias seen on tele.  - Bp remain stable UC 40, 000 colonies.  N/V  -Resolved.  -No further inpatient workup planned at this point.    Procedures: Echo 3.18.2014: Systolic function was normal. The estimated ejection fraction was in the range of 55% to 60%. Wall motion was normal; there were no regional wall motion abnormalities. Features are consistent with a pseudonormal left ventricular filling pattern, with concomitant abnormal relaxation and increased filling pressure (grade 2 diastolic dysfunction). Carotid doppler 3.18.2014: no ICA stenosis.  Consultations:  none  Discharge Exam: Filed Vitals:   11/21/12 1335 11/21/12 2200 11/22/12 0500 11/22/12 0600  BP: 138/52 150/58  117/87  Pulse: 72 72  88  Temp:  98 F (36.7 C) 97.5 F (36.4 C)  97.7 F (36.5 C)  TempSrc: Oral Oral  Oral  Resp: 18 18  18   Height:      Weight:   49.4 kg (108 lb 14.5 oz)   SpO2: 92% 96%  98%    General: A&O x3 Cardiovascular: RRR Respiratory: good air movement CTA B/L  Discharge Instructions  Discharge Orders   Future Orders Complete By Expires     Diet - low sodium heart healthy  As directed     Increase activity slowly  As directed         Medication List    TAKE these medications       acetaminophen 325 MG tablet  Commonly known as:  TYLENOL  Take 650 mg by mouth 3 (three) times daily before meals.     aspirin 81 MG chewable tablet  Chew 81 mg by mouth daily.     calcium carbonate 500 MG chewable tablet  Commonly known as:  TUMS - dosed in mg elemental calcium  Chew 1 tablet by mouth daily.     docusate sodium 100 MG capsule  Commonly known as:  COLACE  Take 100 mg by mouth 2 (two) times daily.     folic acid 1 MG tablet  Commonly known as:  FOLVITE  Take 1 mg by mouth daily.     FORTEO [place]  Inject 20 mcg into the skin daily.     HYDROcodone-acetaminophen 5-325 MG per tablet  Commonly known as:  NORCO/VICODIN  Take 1 tablet by mouth 3 (three) times daily as needed for pain.     hydroxypropyl methylcellulose 2.5 % ophthalmic solution  Commonly known as:  ISOPTO TEARS  Place 2 drops into both eyes 2 (two) times daily.     levothyroxine 50 MCG tablet  Commonly known as:  SYNTHROID, LEVOTHROID  Take 50 mcg by mouth daily.     meclizine 12.5 MG tablet  Commonly known as:  ANTIVERT  Take 12.5 mg by mouth daily as needed for dizziness.     meloxicam 7.5 MG tablet  Commonly known as:  MOBIC  Take 7.5 mg by mouth daily.     pantoprazole 40 MG tablet  Commonly known as:  PROTONIX  Take 80 mg by mouth daily.     predniSONE 5 MG tablet  Commonly known as:  DELTASONE  Take 5 mg by mouth daily.     promethazine 25 MG tablet  Commonly known as:  PHENERGAN  Take 25 mg by mouth  every 4 (four) hours as needed. For nausea             Follow-up Information   Follow up with METHENEY,CATHERINE, MD In 2 weeks. (hospital follow up with PCP)    Contact information:   [address] [place] [address] [phone]        The results of significant diagnostics from this hospitalization (including imaging, microbiology, ancillary and laboratory) are listed below for reference.    Significant Diagnostic Studies: Dg Chest 2 View  11/20/2012  *RADIOLOGY REPORT*  Clinical Data: Vomiting, back pain  CHEST - 2 VIEW  Comparison: 01/06/2012  Findings: Chronic interstitial markings/emphysematous changes.  No focal consolidation.  No pleural effusion or pneumothorax.  The heart is normal in size.  Mild degenerative changes of the visualized thoracolumbar spine. Prior vertebral augmentation at T12.  Sclerotic lesions in the left proximal humerus, unchanged from 04/08/2011.  IMPRESSION: No evidence of acute cardiopulmonary disease.   Original Report Authenticated By: Charline Bills, M.D.    Ct Head Wo Contrast  11/20/2012  *RADIOLOGY REPORT*  Clinical Data: Dizziness, syncope  CT HEAD WITHOUT CONTRAST  Technique:  Contiguous axial images were obtained from the base of the skull through the vertex without contrast.  Comparison: 10/11/2011  Findings: No evidence of parenchymal hemorrhage or extra-axial fluid collection. No mass lesion, mass effect, or midline shift.  No CT evidence of acute infarction.  Subcortical white matter and periventricular small vessel ischemic changes.  Intracranial atherosclerosis.  Global cortical and central atrophy.  Secondary ventricular prominence.  The visualized paranasal sinuses are essentially clear. The mastoid air cells are unopacified.  No fracture  IMPRESSION: No evidence of acute intracranial abnormality.  Atrophy with small vessel ischemic changes and intracranial atherosclerosis.   Original Report Authenticated By: Charline Bills, M.D.    Ct Abdomen Pelvis W Contrast  11/20/2012  *RADIOLOGY REPORT*  Clinical Data: Lower abdominal pain and cramping.  Vomiting. Dizziness.  CT ABDOMEN AND PELVIS WITH CONTRAST  Technique:  Multidetector CT imaging of the abdomen and pelvis was performed following the standard protocol during bolus administration of intravenous contrast.  Contrast: 80mL OMNIPAQUE IOHEXOL 300 MG/ML  SOLN  Comparison: None.  Findings: The liver, gallbladder, pancreas, spleen, adrenal glands, and kidneys are normal in appearance.  Gallbladder is unremarkable. No evidence of hydronephrosis.  No soft tissue masses or lymphadenopathy identified within the abdomen or pelvis.  Urinary bladder is distended but otherwise unremarkable appearance. Uterus and adnexa are unremarkable.  No evidence of inflammatory process or abnormal fluid collections.  No evidence of inflammatory process or abnormal fluid collections.  No evidence of bowel obstruction or hernia.  Lumbar spine degenerative changes and scoliosis noted.  IMPRESSION:  1.  Distended but otherwise unremarkable appearance of the urinary bladder.  Urinary retention cannot be excluded;  clinical correlation is recommended. 2.  No other significant abnormality identified.   Original Report Authenticated By: Myles Rosenthal, M.D.     Microbiology: Recent Results (from the past 240 hour(s))  URINE CULTURE     Status: None   Collection Time    11/20/12 12:18 PM      Result Value Range Status   Specimen Description URINE, RANDOM   Final   Special Requests NONE   Final   Culture  Setup Time 11/21/2012 03:10   Final   Colony Count 40,000 COLONIES/ML   Final   Culture     Final   Value: Multiple bacterial morphotypes present, none predominant. Suggest appropriate recollection if clinically indicated.   Report Status 11/21/2012 FINAL   Final     Labs: Basic Metabolic Panel:  Recent Labs Lab 11/20/12 1034 11/21/12 0525  NA 131* 136  K 3.9 4.5  CL 94* 100  CO2  26 27  GLUCOSE 103* 88  BUN 18 16  CREATININE 0.96 0.87  CALCIUM 9.5 9.2   Liver Function Tests:  Recent Labs Lab 11/20/12 1034  AST 20  ALT 16  ALKPHOS 72  BILITOT 0.3  PROT 6.5  ALBUMIN 3.5    Recent Labs Lab 11/20/12 1034  LIPASE 42   No results found for this basename: AMMONIA,  in the last 168 hours CBC:  Recent Labs Lab 11/20/12 1034 11/21/12 0525  WBC 9.4 8.2  NEUTROABS  5.5  --   HGB 11.9* 12.0  HCT 34.3* 35.0*  MCV 97.7 99.4  PLT 292 319   Cardiac Enzymes:  Recent Labs Lab 11/20/12 1034  TROPONINI <0.30   BNP: BNP (last 3 results) No results found for this basename: PROBNP,  in the last 8760 hours CBG: No results found for this basename: GLUCAP,  in the last 168 hours   Signed:  Marinda Elk  [facility] 11/22/2012, 7:53 AM

## 2012-11-22 NOTE — Consult Note (Cosign Needed)
Lauren Wilson EdD 

## 2012-11-22 NOTE — Progress Notes (Signed)
PT Cancellation Note  Patient Details Name: [name] MRN: [id number] DOB: Oct 18, 1923   Cancelled Treatment:    Reason Eval/Treat Not Completed: Other (comment) (pt politely refused because she is waiting to leave). Pt very distressed because she says she has been waiting all day and now she is going to return to ALF during 2nd shift and they will not take care of her and leave her sitting all night. Spoke to RN and relayed pt's concerns.   Dexton Zwilling, [place] 11/22/2012, 2:05 PM

## 2013-06-05 ENCOUNTER — Other Ambulatory Visit: Payer: Self-pay | Admitting: Dermatology

## 2014-04-05 ENCOUNTER — Encounter (HOSPITAL_COMMUNITY): Payer: Self-pay | Admitting: Emergency Medicine

## 2014-04-05 ENCOUNTER — Emergency Department (HOSPITAL_COMMUNITY): Payer: Medicare Other

## 2014-04-05 ENCOUNTER — Emergency Department (HOSPITAL_COMMUNITY)
Admission: EM | Admit: 2014-04-05 | Discharge: 2014-04-05 | Disposition: A | Discharge status: Home / Self Care | Payer: Medicare Other | Attending: Emergency Medicine | Admitting: Emergency Medicine

## 2014-04-05 DIAGNOSIS — Z79899 Other long term (current) drug therapy: Secondary | ICD-10-CM | POA: Insufficient documentation

## 2014-04-05 DIAGNOSIS — IMO0002 Reserved for concepts with insufficient information to code with codable children: Secondary | ICD-10-CM | POA: Diagnosis not present

## 2014-04-05 DIAGNOSIS — J449 Chronic obstructive pulmonary disease, unspecified: Secondary | ICD-10-CM | POA: Diagnosis not present

## 2014-04-05 DIAGNOSIS — M069 Rheumatoid arthritis, unspecified: Secondary | ICD-10-CM | POA: Insufficient documentation

## 2014-04-05 DIAGNOSIS — M1990 Unspecified osteoarthritis, unspecified site: Secondary | ICD-10-CM | POA: Diagnosis not present

## 2014-04-05 DIAGNOSIS — E039 Hypothyroidism, unspecified: Secondary | ICD-10-CM | POA: Diagnosis not present

## 2014-04-05 DIAGNOSIS — G8929 Other chronic pain: Secondary | ICD-10-CM | POA: Insufficient documentation

## 2014-04-05 DIAGNOSIS — Z7982 Long term (current) use of aspirin: Secondary | ICD-10-CM | POA: Diagnosis not present

## 2014-04-05 DIAGNOSIS — J4489 Other specified chronic obstructive pulmonary disease: Secondary | ICD-10-CM | POA: Insufficient documentation

## 2014-04-05 DIAGNOSIS — R1011 Right upper quadrant pain: Secondary | ICD-10-CM | POA: Insufficient documentation

## 2014-04-05 DIAGNOSIS — Z87891 Personal history of nicotine dependence: Secondary | ICD-10-CM | POA: Insufficient documentation

## 2014-04-05 DIAGNOSIS — Z8679 Personal history of other diseases of the circulatory system: Secondary | ICD-10-CM | POA: Diagnosis not present

## 2014-04-05 DIAGNOSIS — K219 Gastro-esophageal reflux disease without esophagitis: Secondary | ICD-10-CM | POA: Insufficient documentation

## 2014-04-05 DIAGNOSIS — Z8619 Personal history of other infectious and parasitic diseases: Secondary | ICD-10-CM | POA: Insufficient documentation

## 2014-04-05 DIAGNOSIS — Z791 Long term (current) use of non-steroidal anti-inflammatories (NSAID): Secondary | ICD-10-CM | POA: Insufficient documentation

## 2014-04-05 DIAGNOSIS — R109 Unspecified abdominal pain: Secondary | ICD-10-CM

## 2014-04-05 LAB — CBC WITH DIFFERENTIAL/PLATELET
Basophils Absolute: 0 10*3/uL (ref 0.0–0.1)
Basophils Relative: 0 % (ref 0–1)
EOS ABS: 0 10*3/uL (ref 0.0–0.7)
EOS PCT: 0 % (ref 0–5)
HCT: 38.2 % (ref 36.0–46.0)
HEMOGLOBIN: 12.8 g/dL (ref 12.0–15.0)
LYMPHS ABS: 1.6 10*3/uL (ref 0.7–4.0)
Lymphocytes Relative: 17 % (ref 12–46)
MCH: 33.2 pg (ref 26.0–34.0)
MCHC: 33.5 g/dL (ref 30.0–36.0)
MCV: 99 fL (ref 78.0–100.0)
MONOS PCT: 12 % (ref 3–12)
Monocytes Absolute: 1.1 10*3/uL — ABNORMAL HIGH (ref 0.1–1.0)
Neutro Abs: 6.7 10*3/uL (ref 1.7–7.7)
Neutrophils Relative %: 71 % (ref 43–77)
Platelets: 239 10*3/uL (ref 150–400)
RBC: 3.86 MIL/uL — AB (ref 3.87–5.11)
RDW: 13.5 % (ref 11.5–15.5)
WBC: 9.5 10*3/uL (ref 4.0–10.5)

## 2014-04-05 LAB — COMPREHENSIVE METABOLIC PANEL
ALK PHOS: 67 U/L (ref 39–117)
ALT: 14 U/L (ref 0–35)
ANION GAP: 13 (ref 5–15)
AST: 22 U/L (ref 0–37)
Albumin: 3.8 g/dL (ref 3.5–5.2)
BUN: 11 mg/dL (ref 6–23)
CO2: 26 mEq/L (ref 19–32)
Calcium: 9.1 mg/dL (ref 8.4–10.5)
Chloride: 98 mEq/L (ref 96–112)
Creatinine, Ser: 0.71 mg/dL (ref 0.50–1.10)
GFR, EST AFRICAN AMERICAN: 87 mL/min — AB (ref >90)
GFR, EST NON AFRICAN AMERICAN: 75 mL/min — AB (ref >90)
GLUCOSE: 125 mg/dL — AB (ref 70–99)
POTASSIUM: 4.2 meq/L (ref 3.7–5.3)
Sodium: 137 mEq/L (ref 137–147)
TOTAL PROTEIN: 7 g/dL (ref 6.0–8.3)
Total Bilirubin: 0.6 mg/dL (ref 0.3–1.2)

## 2014-04-05 LAB — TROPONIN I

## 2014-04-05 LAB — LIPASE, BLOOD: Lipase: 16 U/L (ref 11–59)

## 2014-04-05 MED ORDER — SODIUM CHLORIDE 0.9 % IV BOLUS (SEPSIS)
250.0000 mL | Freq: Once | INTRAVENOUS | Status: AC
  Administered 2014-04-05: 250 mL via INTRAVENOUS

## 2014-04-05 MED ORDER — LACTULOSE 20 GM/30ML PO SOLN: 30.0000 mL | Freq: Two times a day (BID) | ORAL | Status: DC

## 2014-04-05 MED ORDER — TRAMADOL HCL 50 MG PO TABS: 50.0000 mg | ORAL_TABLET | Freq: Four times a day (QID) | ORAL | Status: DC | PRN

## 2014-04-05 MED ORDER — MORPHINE SULFATE 4 MG/ML IJ SOLN
4.0000 mg | Freq: Once | INTRAMUSCULAR | Status: AC
  Administered 2014-04-05: 4 mg via INTRAVENOUS
  Filled 2014-04-05: qty 1

## 2014-04-05 MED ORDER — IOHEXOL 300 MG/ML  SOLN
80.0000 mL | Freq: Once | INTRAMUSCULAR | Status: AC | PRN
Start: 2014-04-05 — End: 2014-04-05
  Administered 2014-04-05: 80 mL via INTRAVENOUS

## 2014-04-05 MED ORDER — IOHEXOL 300 MG/ML  SOLN
25.0000 mL | INTRAMUSCULAR | Status: AC
  Administered 2014-04-05 (×2): 25 mL via ORAL

## 2014-04-05 MED ORDER — LACTULOSE 10 GM/15ML PO SOLN
30.0000 g | Freq: Once | ORAL | Status: AC
  Administered 2014-04-05: 30 g via ORAL
  Filled 2014-04-05: qty 45

## 2014-04-05 NOTE — ED Provider Notes (Signed)
CSN: [id number]     Arrival date & time 04/05/14  1216 History   First MD Initiated Contact with Patient 04/05/14 1220     Chief Complaint  Patient presents with  . Abdominal Pain     (Consider location/radiation/quality/duration/timing/severity/associated sxs/prior Treatment) Patient is a 80 y.o. female presenting with abdominal pain. The history is provided by the patient.  Abdominal Pain Pain location:  RUQ Pain quality: stabbing   Pain radiates to:  Does not radiate Pain severity:  Moderate Onset quality:  Sudden Duration:  10 hours Timing:  Constant Progression:  Worsening Chronicity:  New Context comment:  While at rest Relieved by:  Nothing Worsened by:  Nothing tried Ineffective treatments:  None tried Associated symptoms: no chest pain, no cough, no diarrhea, no dysuria, no fatigue, no fever, no hematuria, no nausea, no shortness of breath and no vomiting     Past Medical History  Diagnosis Date  . Mitral valve prolapse   . Hiatal hernia   . Diverticulitis   . Orthostatic hypotension   . Vertigo   . Osteoporosis   . Diverticulosis   . Shortness of breath   . Hypothyroidism   . GERD (gastroesophageal reflux disease)   . Thyroid disease   . Atrial fibrillation   . Tachyarrhythmia     "I have a fast heartbeat sometimes" (11/20/2012)  . COPD (chronic obstructive pulmonary disease)     "not bothered w/it anymore" (11/20/2012)  . H/O: whooping cough 1928  . Osteoarthritis   . Rheumatoid arthritis(714.0)   . Chronic lower back pain     "from a ruptured lower disc" (11/20/2012)  . Carcinoma     "top of left leg, had it taken off; right cheek, there now" (11/20/2012)   Past Surgical History  Procedure Laterality Date  . Back surgery    . Tonsillectomy  ~ 1933  . Fixation kyphoplasty thoracic spine  ~ 2011    "after falling and breaking 2 vertebra" (11/20/2012)  . Tendon repair Right ~ 2009    wrist (11/20/2012)  . Skin cancer excision Left ~ 2007    "top of my  lower leg" (11/20/2012)   Family History  Problem Relation Age of Onset  . Cancer Mother     bladder  . Hyperlipidemia Sister   . Stroke Maternal Aunt    History  Substance Use Topics  . Smoking status: Former Smoker -- 0.12 packs/day for 40 years    Types: Cigarettes    Quit date: 09/06/1985  . Smokeless tobacco: Never Used     Comment: 11/20/2012 "stopped smoking 27 yr ago"  . Alcohol Use: No   OB History   Grav Para Term Preterm Abortions TAB SAB Ect Mult Living                 Review of Systems  Constitutional: Negative for fever and fatigue.  HENT: Negative for congestion and drooling.   Eyes: Negative for pain.  Respiratory: Negative for cough and shortness of breath.   Cardiovascular: Negative for chest pain.  Gastrointestinal: Negative for nausea, vomiting, abdominal pain and diarrhea.  Genitourinary: Negative for dysuria and hematuria.  Musculoskeletal: Negative for back pain, gait problem and neck pain.  Skin: Negative for color change.  Neurological: Negative for dizziness and headaches.  Hematological: Negative for adenopathy.  Psychiatric/Behavioral: Negative for behavioral problems.  All other systems reviewed and are negative.     Allergies  Lactose intolerance (gi)  Home Medications   Prior to Admission medications  Medication Sig Start Date End Date Taking? Authorizing Provider  acetaminophen (TYLENOL) 325 MG tablet Take 650 mg by mouth 3 (three) times daily before meals.     Historical Provider, MD  aspirin 81 MG chewable tablet Chew 81 mg by mouth daily.    Historical Provider, MD  calcium carbonate (TUMS - DOSED IN MG ELEMENTAL CALCIUM) 500 MG chewable tablet Chew 1 tablet by mouth daily.    Historical Provider, MD  docusate sodium (COLACE) 100 MG capsule Take 100 mg by mouth 2 (two) times daily.    Historical Provider, MD  folic acid (FOLVITE) 1 MG tablet Take 1 mg by mouth daily.  11/17/10   Historical Provider, MD  HYDROcodone-acetaminophen  (NORCO/VICODIN) 5-325 MG per tablet Take 1 tablet by mouth 3 (three) times daily as needed for pain. 11/22/12   Charlynne Cousins, MD  hydroxypropyl methylcellulose (ISOPTO TEARS) 2.5 % ophthalmic solution Place 2 drops into both eyes 2 (two) times daily.    Historical Provider, MD  levothyroxine (SYNTHROID, LEVOTHROID) 50 MCG tablet Take 50 mcg by mouth daily.    Historical Provider, MD  meclizine (ANTIVERT) 12.5 MG tablet Take 12.5 mg by mouth daily as needed for dizziness.     Historical Provider, MD  meloxicam (MOBIC) 7.5 MG tablet Take 7.5 mg by mouth daily.    Historical Provider, MD  pantoprazole (PROTONIX) 40 MG tablet Take 80 mg by mouth daily.    Historical Provider, MD  predniSONE (DELTASONE) 5 MG tablet Take 5 mg by mouth daily.  11/17/10   Historical Provider, MD  promethazine (PHENERGAN) 25 MG tablet Take 25 mg by mouth every 4 (four) hours as needed. For nausea     Historical Provider, MD  Teriparatide, Recombinant, (FORTEO [place]) Inject 20 mcg into the skin daily.    Historical Provider, MD   BP 151/82  Temp(Src) 98.7 F (37.1 C) (Oral)  Resp 19  SpO2 100% Physical Exam  Nursing note and vitals reviewed. Constitutional: She is oriented to person, place, and time. She appears well-developed and well-nourished.  HENT:  Head: Normocephalic.  Mouth/Throat: Oropharynx is clear and moist. No oropharyngeal exudate.  Eyes: Conjunctivae and EOM are normal. Pupils are equal, round, and reactive to light.  Neck: Normal range of motion. Neck supple.  Cardiovascular: Normal rate, regular rhythm, normal heart sounds and intact distal pulses.  Exam reveals no gallop and no friction rub.   No murmur heard. Pulmonary/Chest: Effort normal and breath sounds normal. No respiratory distress. She has no wheezes.  Abdominal: Soft. Bowel sounds are normal. There is tenderness (mild to mod ttp of RUQ). There is no rebound and no guarding.  Musculoskeletal: Normal range of motion. She exhibits no  edema and no tenderness.  Neurological: She is alert and oriented to person, place, and time.  Skin: Skin is warm and dry.  Psychiatric: She has a normal mood and affect. Her behavior is normal.    ED Course  Procedures (including critical care time) Labs Review Labs Reviewed  CBC WITH DIFFERENTIAL - Abnormal; Notable for the following:    RBC 3.86 (*)    Monocytes Absolute 1.1 (*)    All other components within normal limits  COMPREHENSIVE METABOLIC PANEL - Abnormal; Notable for the following:    Glucose, Bld 125 (*)    GFR calc non Af Amer 75 (*)    GFR calc Af Amer 87 (*)    All other components within normal limits  LIPASE, BLOOD  TROPONIN I  Imaging Review Dg Chest 2 View  04/05/2014   CLINICAL DATA:  Right upper quadrant pain  EXAM: CHEST  2 VIEW  COMPARISON:  11/20/2012  FINDINGS: The lungs are hyperinflated likely secondary to COPD. There are bilateral chronic bronchitic changes. There is no focal parenchymal opacity, pleural effusion, or pneumothorax. The heart and mediastinal contours are unremarkable.  There is evidence prior T12 vertebroplasty. There is a benign ossified fibro-osseous lesion of the left proximal humerus.  IMPRESSION: No active cardiopulmonary disease.   Electronically Signed   By: Kathreen Devoid   On: 04/05/2014 13:45   US Abdomen Limited Ruq  04/05/2014   CLINICAL DATA:  Right upper quadrant pain.  EXAM: US ABDOMEN LIMITED - RIGHT UPPER QUADRANT  COMPARISON:  CT 11/20/2012  FINDINGS: Gallbladder:  No gallstones or wall thickening visualized. No sonographic Murphy sign noted.  Common bile duct:  Diameter: 6.5 mm  Liver:  No focal lesion identified. Within normal limits in parenchymal echogenicity.  IMPRESSION: No acute hepatobiliary findings.   Electronically Signed   By: Marin Olp M.D.   On: 04/05/2014 15:50     EKG Interpretation   Date/Time:  Friday April 05 2014 12:29:40 EDT Ventricular Rate:  87 PR Interval:  164 QRS Duration: 115 QT  Interval:  393 QTC Calculation: 473 R Axis:   -58 Text Interpretation:  Sinus rhythm Ventricular trigeminy LAE, consider  biatrial enlargement Incomplete RBBB and LAFB Nonspecific T abnormalities,  lateral leads Confirmed by Bobby Barton  MD, Marney Treloar ([id number]) on 04/05/2014  3:29:20 PM      MDM   Final diagnoses:  Abdominal pain, unspecified abdominal location    12:45 PM 80 y.o. female w hx of MVP, afib who presents with sudden onset right upper quadrant pain which began at 2 AM this morning. She denies any nausea, vomiting, or diarrhea. She denies any shortness of breath or chest pain. She does state that she has reproduction of the pain with deep inspiration. She is focally tender in the right upper quadrant. I suspect this is related to abdominal pathology rather than pulmonary. Will get screening chest x-ray. Will get pain control and labwork.  [place] neg. Labs thus far non-contrib. Pt well appearing and in no pain. Will get CT of abdomen. Care transferred to Dr. Eulis Foster.     Blanchard Kelch, MD 04/05/14 [phone]

## 2014-04-05 NOTE — ED Notes (Signed)
[facility] has been called. Spoke to [place], receiving nurse at receiving facility in patient status, and plan to return with new prescriptions.

## 2014-04-05 NOTE — ED Notes (Signed)
Patient now leaving with [facility].

## 2014-04-05 NOTE — Discharge Instructions (Signed)

## 2014-04-05 NOTE — ED Notes (Signed)
Pt woke up 2am with right upper abd pain- worsens with deep breath. Feels very sharp. Pt's HR 130's at [place] per staff ,EMS obtained EKG with  HR was 80's-90's. Pt is alert and oriented. BP 158/73, HR 94, resp 18, sats 96% on room air. CBG 147. Pt denies N/V/D

## 2014-04-05 NOTE — ED Notes (Signed)
Pt in [place]

## 2014-04-05 NOTE — ED Notes (Signed)
Spoke with staff from [facility] where pt resides and updated them on pt's care.

## 2014-04-05 NOTE — ED Notes (Signed)
CT was called to inform that patient has finished drinking contrast.

## 2014-04-05 NOTE — ED Notes (Addendum)
Called pharmacy in regards to missing lactulose. Spoke to [place], they will send dose now.

## 2014-04-05 NOTE — ED Notes (Signed)
Reported to Dr. Eulis Foster that complains of pain.  MD reviews CT results, and goes to see patient. No new orders at this time.

## 2014-05-01 ENCOUNTER — Ambulatory Visit: Payer: Medicare Other | Admitting: Cardiology

## 2014-06-11 ENCOUNTER — Encounter: Payer: Self-pay | Admitting: *Deleted

## 2014-06-12 ENCOUNTER — Encounter: Payer: Self-pay | Admitting: Cardiology

## 2014-06-12 ENCOUNTER — Telehealth: Payer: Self-pay | Admitting: Cardiovascular Disease

## 2014-06-12 ENCOUNTER — Ambulatory Visit (INDEPENDENT_AMBULATORY_CARE_PROVIDER_SITE_OTHER): Payer: Medicare Other | Admitting: Cardiology

## 2014-06-12 VITALS — BP 126/78 | HR 83 | Ht 63.0 in | Wt 103.0 lb

## 2014-06-12 DIAGNOSIS — I482 Chronic atrial fibrillation, unspecified: Secondary | ICD-10-CM

## 2014-06-12 DIAGNOSIS — R002 Palpitations: Secondary | ICD-10-CM

## 2014-06-12 NOTE — Telephone Encounter (Signed)
Spoke with [facility]. Explained that the electrodes and batteries should be changed every day and there is enough of each to last for 14 days. Explained to call [place] (1-800# on back of brochure) to request more supplies as we do not keep extras in stock at the office. [name] was confused, stating the brochure said to change electrodes/batteries QOD and she states patient bathes more than that and will need more supplies. Reiterated how often to change electrodes and that they will need to reach out to company for more supplies.

## 2014-06-12 NOTE — Telephone Encounter (Signed)
Pt is wearing a monitor. She would like you to mail some extra leads for her heart monitor. She will require more than usual,because she is bathed a lot in the facility.

## 2014-06-12 NOTE — Patient Instructions (Signed)
Your physician recommends that you schedule a follow-up appointment in: one month with an extender  Wear you event monitor for 21 days then mail it back

## 2014-06-12 NOTE — Progress Notes (Signed)
HPI The patient presents for evaluation of syncope and palpitations.  She was seen in the past by Dr. Mare Ferrari.  She has had a long history of syncope. I was able to review records from her most recent hospitalization for this in March of last year. She also had an evaluation in 2002. She has documented orthostatic hypotension. There is also a mention of atrial fibrillation although I don't see these records. The patient reports that she thinks her syncope was worse in the past. She actually says she has not passed out in over 3 weeks. She said it typically happens when standing and sometimes when seated. She has to be very careful going from your, and to a standing position. However, she's passed out she says there is no prodrome. She will lose consciousness. She does have palpitations but she feels these all the time and does not think they're associated with presyncope. She doesn't have any chest pressure, neck or arm discomfort. She's had no weight gain or edema. She gets around in her nursing home by using a rolling walker.  Allergies  Allergen Reactions  . Lactose Intolerance (Gi) Other (See Comments)    intolerance    Current Outpatient Prescriptions  Medication Sig Dispense Refill  . acetaminophen (TYLENOL) 325 MG tablet Take 650 mg by mouth every 4 (four) hours as needed for mild pain or fever. For temperature of 99 or greater. Not to exceed 3000 mg per day. Contact regional provider within 24 hours of fever onset.      [name] aspirin 81 MG chewable tablet Chew 81 mg by mouth daily.      . calcium carbonate (TUMS - DOSED IN MG ELEMENTAL CALCIUM) 500 MG chewable tablet Chew 1 tablet by mouth daily.      . cyanocobalamin 100 MCG tablet Take 100 mcg by mouth daily.      . folic acid (FOLVITE) 1 MG tablet Take 1 mg by mouth daily.       . hydroxypropyl methylcellulose (ISOPTO TEARS) 2.5 % ophthalmic solution Place 2 drops into both eyes 2 (two) times daily.      . Lactulose 20 GM/30ML SOLN  Take 30 mLs (20 g total) by mouth 2 (two) times daily.  500 mL  0  . leflunomide (ARAVA) 10 MG tablet Take 10 mg by mouth daily.      . magnesium hydroxide (MILK OF MAGNESIA) 400 MG/5ML suspension Take 30 mLs by mouth daily as needed (constipation).      . magnesium oxide (MAG-OX) 400 MG tablet Take 400 mg by mouth daily.      . meclizine (ANTIVERT) 12.5 MG tablet Take 12.5 mg by mouth 3 (three) times daily as needed for dizziness (vertigo).       [name] oxycodone (OXY-IR) 5 MG capsule Take 5 mg by mouth at bedtime.      . OxyCODONE (OXYCONTIN) 10 mg T12A 12 hr tablet Take 10 mg by mouth every morning.      . pantoprazole (PROTONIX) 40 MG tablet Take 80 mg by mouth daily.      . predniSONE (DELTASONE) 5 MG tablet Take 5 mg by mouth daily.       [name] senna-docusate (SENOKOT-S) 8.6-50 MG per tablet Take 2 tablets by mouth at bedtime as needed (contipation.). May have 1 tablet extra daily as needed       Current Facility-Administered Medications  Medication Dose Route Frequency Provider Last Rate Last Dose  . NON FORMULARY   Injection 1  day or 1 dose Hali Marry, MD      . Salley Scarlet FORMULARY   Injection 1 day or 1 dose Hali Marry, MD        Past Medical History  Diagnosis Date  . Mitral valve prolapse   . Hiatal hernia   . Diverticulitis   . Orthostatic hypotension   . Vertigo   . Osteoporosis   . Diverticulosis   . Hypothyroidism   . GERD (gastroesophageal reflux disease)   . Atrial fibrillation   . COPD (chronic obstructive pulmonary disease)     "not bothered w/it anymore" (11/20/2012)  . Osteoarthritis   . Rheumatoid arthritis(714.0)   . Chronic lower back pain     "from a ruptured lower disc" (11/20/2012)  . Carcinoma     "top of left leg, had it taken off; right cheek, there now" (11/20/2012)    Past Surgical History  Procedure Laterality Date  . Back surgery    . Tonsillectomy  ~ 1933  . Fixation kyphoplasty thoracic spine  ~ 2011    "after falling and breaking 2  vertebra" (11/20/2012)  . Tendon repair Right ~ 2009    wrist (11/20/2012)  . Skin cancer excision Left ~ 2007    "top of my lower leg" (11/20/2012)    ROS:  As stated in the HPI and negative for all other systems.  PHYSICAL EXAM BP 126/78  Pulse 83  Ht 5\' 3"  (1.6 m)  Wt 103 lb (46.72 kg)  BMI 18.25 kg/m2 GENERAL:  Thin and frail appearing HEENT:  Pupils equal round and reactive, fundi not visualized, oral mucosa unremarkable NECK:  No jugular venous distention, waveform within normal limits, carotid upstroke brisk and symmetric, no bruits, no thyromegaly LYMPHATICS:  No cervical, inguinal adenopathy LUNGS:  Clear to auscultation bilaterally BACK:  No CVA tenderness CHEST:  Unremarkable HEART:  PMI not displaced or sustained,S1 and S2 within normal limits, no S3, no S4, no clicks, no rubs, no murmurs ABD:  Flat, positive bowel sounds normal in frequency in pitch, no bruits, no rebound, no guarding, no midline pulsatile mass, no hepatomegaly, no splenomegaly EXT:  2 plus pulses throughout, no edema, no cyanosis no clubbing SKIN:  No rashes no nodules NEURO:  Cranial nerves II through XII grossly intact, motor grossly intact throughout PSYCH:  Cognitively intact, oriented to person place and time  EKG:  Sinus rhythm, rate 83, RSR prime V1 and V2, premature ectopic complexes, no acute ST-T wave changes.  06/12/2014   ASSESSMENT AND PLAN  SYNCOPE:  This has been a chronic problem and she actually reports that he is less than previous. Of note she was not orthostatic in the office today and in fact the BP was elevated.   I will apply an event monitor as described below for further evaluation.  PALPITATIONS:  VELIA PAMER will need a 21 day event monitor.  The patients symptoms necessitate an event monitor.  The symptoms are too infrequent to be identified on a Holter monitor.    HTN:  Despite the fact her blood pressure is somewhat elevated when we gave the orthostatic readings I will  not address this further at this time given her symptoms and frailty.

## 2014-07-23 ENCOUNTER — Ambulatory Visit (INDEPENDENT_AMBULATORY_CARE_PROVIDER_SITE_OTHER): Payer: Medicare Other | Admitting: Cardiology

## 2014-07-23 ENCOUNTER — Encounter: Payer: Self-pay | Admitting: Cardiology

## 2014-07-23 VITALS — BP 108/58 | HR 88 | Ht 63.0 in | Wt 106.1 lb

## 2014-07-23 DIAGNOSIS — R55 Syncope and collapse: Secondary | ICD-10-CM

## 2014-07-23 NOTE — Patient Instructions (Signed)
Continue same medications.   Your physician wants you to follow-up in: 1 year.  You will receive a reminder letter in the mail two months in advance. If you don't receive a letter, please call our office to schedule the follow-up appointment.  

## 2014-07-23 NOTE — Progress Notes (Signed)
HPI The patient presents for evaluation of syncope and palpitations. She has had a long history of syncope. She has documented orthostatic hypotension. There is also a mention of atrial fibrillation although I didn't see these records.  After the last visit she did wear a 21 day event monitor that demonstrated no symptomatic dysrhythmias. She doesn't have any chest pressure, neck or arm discomfort. She's had no weight gain or edema. She gets around in her nursing home by using a rolling walker.   She's had no further syncopal spells since I saw her. She knows when she is getting lightheaded and she sits down. She avoids standing up quickly.  Allergies  Allergen Reactions  . Lactose Intolerance (Gi) Other (See Comments)    intolerance    Current Outpatient Prescriptions  Medication Sig Dispense Refill  . acetaminophen (TYLENOL) 325 MG tablet Take 650 mg by mouth every 4 (four) hours as needed for mild pain or fever. For temperature of 99 or greater. Not to exceed 3000 mg per day. Contact regional provider within 24 hours of fever onset.    [name] aspirin 81 MG chewable tablet Chew 81 mg by mouth daily.    . calcium carbonate (TUMS - DOSED IN MG ELEMENTAL CALCIUM) 500 MG chewable tablet Chew 1 tablet by mouth daily.    . cyanocobalamin 100 MCG tablet Take 100 mcg by mouth daily.    [name] gabapentin (NEURONTIN) 100 MG capsule Take 100 mg by mouth 2 (two) times daily.    . hydroxypropyl methylcellulose (ISOPTO TEARS) 2.5 % ophthalmic solution Place 2 drops into both eyes 2 (two) times daily.    . Lactulose 20 GM/30ML SOLN Take 30 mLs (20 g total) by mouth 2 (two) times daily. 500 mL 0  . leflunomide (ARAVA) 10 MG tablet Take 10 mg by mouth daily.    [name] levothyroxine (SYNTHROID, LEVOTHROID) 50 MCG tablet Take 50 mcg by mouth daily.    . magnesium hydroxide (MILK OF MAGNESIA) 400 MG/5ML suspension Take 30 mLs by mouth daily as needed (constipation).    . magnesium oxide (MAG-OX) 400 MG tablet Take 400 mg  by mouth daily.    . meclizine (ANTIVERT) 12.5 MG tablet Take 12.5 mg by mouth 3 (three) times daily as needed for dizziness (vertigo).     [name] morphine (MS CONTIN) 15 MG 12 hr tablet Take 15 mg by mouth daily.    . OxyCODONE (OXYCONTIN) 10 mg T12A 12 hr tablet Take 10 mg by mouth every morning.    . pantoprazole (PROTONIX) 40 MG tablet Take 80 mg by mouth daily.    . predniSONE (DELTASONE) 5 MG tablet Take 5 mg by mouth daily.     . promethazine (PHENERGAN) 25 MG tablet Take 25 mg by mouth every 4 (four) hours as needed for nausea or vomiting.    . senna-docusate (SENOKOT-S) 8.6-50 MG per tablet Take 2 tablets by mouth at bedtime as needed (contipation.). May have 1 tablet extra daily as needed     Current Facility-Administered Medications  Medication Dose Route Frequency Provider Last Rate Last Dose  . NON FORMULARY   Injection 1 day or 1 dose Hali Marry, MD      . NON FORMULARY   Injection 1 day or 1 dose Hali Marry, MD        Past Medical History  Diagnosis Date  . Mitral valve prolapse   . Hiatal hernia   . Diverticulitis   . Orthostatic hypotension   .  Vertigo   . Osteoporosis   . Diverticulosis   . Hypothyroidism   . GERD (gastroesophageal reflux disease)   . Atrial fibrillation   . COPD (chronic obstructive pulmonary disease)     "not bothered w/it anymore" (11/20/2012)  . Osteoarthritis   . Rheumatoid arthritis(714.0)   . Chronic lower back pain     "from a ruptured lower disc" (11/20/2012)  . Carcinoma     "top of left leg, had it taken off; right cheek, there now" (11/20/2012)    Past Surgical History  Procedure Laterality Date  . Back surgery    . Tonsillectomy  ~ 1933  . Fixation kyphoplasty thoracic spine  ~ 2011    "after falling and breaking 2 vertebra" (11/20/2012)  . Tendon repair Right ~ 2009    wrist (11/20/2012)  . Skin cancer excision Left ~ 2007    "top of my lower leg" (11/20/2012)    ROS:  As stated in the HPI and negative for all  other systems.  PHYSICAL EXAM BP 108/58 mmHg  Pulse 88  Ht 5\' 3"  (1.6 m)  Wt 106 lb 1.6 oz (48.127 kg)  BMI 18.80 kg/m2 GENERAL:  Thin and frail appearing NECK:  No jugular venous distention, waveform within normal limits, carotid upstroke brisk and symmetric, no bruits, no thyromegaly LUNGS:  Clear to auscultation bilaterally CHEST:  Unremarkable HEART:  PMI not displaced or sustained,S1 and S2 within normal limits, no S3, no S4, no clicks, no rubs, no murmurs ABD:  Flat, positive bowel sounds normal in frequency in pitch, no bruits, no rebound, no guarding, no midline pulsatile mass, no hepatomegaly, no splenomegaly EXT:  2 plus pulses throughout, no edema, no cyanosis no clubbing SKIN:  No rashes no nodules, bruising scattered    EKG:  Sinus rhythm, rate 83, RSR prime V1 and V2, premature ectopic complexes, no acute ST-T wave changes.  07/23/2014   ASSESSMENT AND PLAN  SYNCOPE:   She has had no further episodes. No change in therapy is indicated. She will practice precaution and avoidance.  PALPITATIONS:   She had no significant dysrhythmias on 21 day event monitor. No further evaluation is indicated.

## 2014-07-25 ENCOUNTER — Other Ambulatory Visit: Payer: Self-pay

## 2015-10-13 ENCOUNTER — Encounter (HOSPITAL_COMMUNITY): Payer: Self-pay | Admitting: Emergency Medicine

## 2015-10-13 ENCOUNTER — Emergency Department (HOSPITAL_COMMUNITY): Payer: Medicare Other

## 2015-10-13 ENCOUNTER — Inpatient Hospital Stay (HOSPITAL_COMMUNITY)
Admission: EM | Admit: 2015-10-13 | Discharge: 2015-10-15 | DRG: 871 | Disposition: A | Discharge status: Skilled Nursing Facility | Payer: Medicare Other | Attending: Family Medicine | Admitting: Family Medicine

## 2015-10-13 DIAGNOSIS — K219 Gastro-esophageal reflux disease without esophagitis: Secondary | ICD-10-CM | POA: Diagnosis present

## 2015-10-13 DIAGNOSIS — Z85828 Personal history of other malignant neoplasm of skin: Secondary | ICD-10-CM

## 2015-10-13 DIAGNOSIS — Z87891 Personal history of nicotine dependence: Secondary | ICD-10-CM

## 2015-10-13 DIAGNOSIS — M545 Low back pain: Secondary | ICD-10-CM | POA: Diagnosis present

## 2015-10-13 DIAGNOSIS — E039 Hypothyroidism, unspecified: Secondary | ICD-10-CM | POA: Diagnosis present

## 2015-10-13 DIAGNOSIS — Z79891 Long term (current) use of opiate analgesic: Secondary | ICD-10-CM

## 2015-10-13 DIAGNOSIS — K449 Diaphragmatic hernia without obstruction or gangrene: Secondary | ICD-10-CM | POA: Diagnosis present

## 2015-10-13 DIAGNOSIS — G8929 Other chronic pain: Secondary | ICD-10-CM | POA: Diagnosis present

## 2015-10-13 DIAGNOSIS — M25519 Pain in unspecified shoulder: Secondary | ICD-10-CM | POA: Diagnosis present

## 2015-10-13 DIAGNOSIS — M810 Age-related osteoporosis without current pathological fracture: Secondary | ICD-10-CM | POA: Diagnosis present

## 2015-10-13 DIAGNOSIS — I341 Nonrheumatic mitral (valve) prolapse: Secondary | ICD-10-CM | POA: Diagnosis present

## 2015-10-13 DIAGNOSIS — J449 Chronic obstructive pulmonary disease, unspecified: Secondary | ICD-10-CM | POA: Diagnosis present

## 2015-10-13 DIAGNOSIS — J189 Pneumonia, unspecified organism: Secondary | ICD-10-CM | POA: Diagnosis present

## 2015-10-13 DIAGNOSIS — J690 Pneumonitis due to inhalation of food and vomit: Secondary | ICD-10-CM | POA: Diagnosis present

## 2015-10-13 DIAGNOSIS — I951 Orthostatic hypotension: Secondary | ICD-10-CM | POA: Diagnosis present

## 2015-10-13 DIAGNOSIS — E860 Dehydration: Secondary | ICD-10-CM | POA: Diagnosis present

## 2015-10-13 DIAGNOSIS — Z79899 Other long term (current) drug therapy: Secondary | ICD-10-CM

## 2015-10-13 DIAGNOSIS — I4891 Unspecified atrial fibrillation: Secondary | ICD-10-CM | POA: Diagnosis present

## 2015-10-13 DIAGNOSIS — Z7982 Long term (current) use of aspirin: Secondary | ICD-10-CM

## 2015-10-13 DIAGNOSIS — Z7952 Long term (current) use of systemic steroids: Secondary | ICD-10-CM | POA: Insufficient documentation

## 2015-10-13 DIAGNOSIS — J440 Chronic obstructive pulmonary disease with acute lower respiratory infection: Secondary | ICD-10-CM | POA: Diagnosis present

## 2015-10-13 DIAGNOSIS — G9340 Encephalopathy, unspecified: Secondary | ICD-10-CM | POA: Diagnosis present

## 2015-10-13 DIAGNOSIS — R0902 Hypoxemia: Secondary | ICD-10-CM | POA: Diagnosis present

## 2015-10-13 DIAGNOSIS — M069 Rheumatoid arthritis, unspecified: Secondary | ICD-10-CM | POA: Diagnosis present

## 2015-10-13 DIAGNOSIS — Z66 Do not resuscitate: Secondary | ICD-10-CM | POA: Diagnosis present

## 2015-10-13 DIAGNOSIS — R112 Nausea with vomiting, unspecified: Secondary | ICD-10-CM | POA: Diagnosis not present

## 2015-10-13 DIAGNOSIS — A419 Sepsis, unspecified organism: Principal | ICD-10-CM | POA: Diagnosis present

## 2015-10-13 DIAGNOSIS — Z809 Family history of malignant neoplasm, unspecified: Secondary | ICD-10-CM | POA: Diagnosis not present

## 2015-10-13 DIAGNOSIS — T17908A Unspecified foreign body in respiratory tract, part unspecified causing other injury, initial encounter: Secondary | ICD-10-CM

## 2015-10-13 DIAGNOSIS — T17998D Other foreign object in respiratory tract, part unspecified causing other injury, subsequent encounter: Secondary | ICD-10-CM | POA: Diagnosis not present

## 2015-10-13 LAB — URINALYSIS, ROUTINE W REFLEX MICROSCOPIC
Bilirubin Urine: NEGATIVE
Glucose, UA: NEGATIVE mg/dL
Hgb urine dipstick: NEGATIVE
Ketones, ur: NEGATIVE mg/dL
Leukocytes, UA: NEGATIVE
Nitrite: NEGATIVE
Protein, ur: NEGATIVE mg/dL
Specific Gravity, Urine: 1.015 (ref 1.005–1.030)
pH: 6.5 (ref 5.0–8.0)

## 2015-10-13 LAB — COMPREHENSIVE METABOLIC PANEL
ALT: 24 U/L (ref 14–54)
AST: 34 U/L (ref 15–41)
Albumin: 3.8 g/dL (ref 3.5–5.0)
Alkaline Phosphatase: 58 U/L (ref 38–126)
Anion gap: 14 (ref 5–15)
BUN: 17 mg/dL (ref 6–20)
CO2: 26 mmol/L (ref 22–32)
Calcium: 9 mg/dL (ref 8.9–10.3)
Chloride: 98 mmol/L — ABNORMAL LOW (ref 101–111)
Creatinine, Ser: 0.96 mg/dL (ref 0.44–1.00)
GFR calc Af Amer: 59 mL/min — ABNORMAL LOW (ref >60)
GFR calc non Af Amer: 51 mL/min — ABNORMAL LOW (ref >60)
Glucose, Bld: 139 mg/dL — ABNORMAL HIGH (ref 65–99)
Potassium: 4.1 mmol/L (ref 3.5–5.1)
Sodium: 138 mmol/L (ref 135–145)
Total Bilirubin: 0.8 mg/dL (ref 0.3–1.2)
Total Protein: 6.3 g/dL — ABNORMAL LOW (ref 6.5–8.1)

## 2015-10-13 LAB — CBC WITH DIFFERENTIAL/PLATELET
Basophils Absolute: 0 10*3/uL (ref 0.0–0.1)
Basophils Relative: 0 %
Eosinophils Absolute: 0.1 10*3/uL (ref 0.0–0.7)
Eosinophils Relative: 1 %
HCT: 41.1 % (ref 36.0–46.0)
Hemoglobin: 13.4 g/dL (ref 12.0–15.0)
Lymphocytes Relative: 9 %
Lymphs Abs: 0.9 10*3/uL (ref 0.7–4.0)
MCH: 34.4 pg — ABNORMAL HIGH (ref 26.0–34.0)
MCHC: 32.6 g/dL (ref 30.0–36.0)
MCV: 105.7 fL — ABNORMAL HIGH (ref 78.0–100.0)
Monocytes Absolute: 0.5 10*3/uL (ref 0.1–1.0)
Monocytes Relative: 5 %
Neutro Abs: 8 10*3/uL — ABNORMAL HIGH (ref 1.7–7.7)
Neutrophils Relative %: 85 %
Platelets: 288 10*3/uL (ref 150–400)
RBC: 3.89 MIL/uL (ref 3.87–5.11)
RDW: 13.2 % (ref 11.5–15.5)
WBC: 9.5 10*3/uL (ref 4.0–10.5)

## 2015-10-13 LAB — TROPONIN I: Troponin I: 0.04 ng/mL — ABNORMAL HIGH (ref <0.031)

## 2015-10-13 LAB — I-STAT CG4 LACTIC ACID, ED: Lactic Acid, Venous: 1.11 mmol/L (ref 0.5–2.0)

## 2015-10-13 LAB — TSH: TSH: 0.784 u[IU]/mL (ref 0.350–4.500)

## 2015-10-13 LAB — MRSA PCR SCREENING: MRSA BY PCR: NEGATIVE

## 2015-10-13 MED ORDER — VANCOMYCIN HCL IN DEXTROSE 1-5 GM/200ML-% IV SOLN
1000.0000 mg | Freq: Once | INTRAVENOUS | Status: AC
  Administered 2015-10-13: 1000 mg via INTRAVENOUS
  Filled 2015-10-13: qty 200

## 2015-10-13 MED ORDER — ACETAMINOPHEN 325 MG PO TABS: 650.0000 mg | ORAL_TABLET | ORAL | Status: DC | PRN

## 2015-10-13 MED ORDER — HYDROXYCHLOROQUINE SULFATE 200 MG PO TABS
200.0000 mg | ORAL_TABLET | Freq: Every day | ORAL | Status: DC
  Administered 2015-10-13 – 2015-10-15 (×3): 200 mg via ORAL
  Filled 2015-10-13 (×3): qty 1

## 2015-10-13 MED ORDER — GABAPENTIN 100 MG PO CAPS
100.0000 mg | ORAL_CAPSULE | Freq: Two times a day (BID) | ORAL | Status: DC
  Administered 2015-10-13 – 2015-10-15 (×4): 100 mg via ORAL
  Filled 2015-10-13 (×4): qty 1

## 2015-10-13 MED ORDER — SODIUM CHLORIDE 0.9 % IV BOLUS (SEPSIS)
500.0000 mL | Freq: Once | INTRAVENOUS | Status: AC
  Administered 2015-10-13: 500 mL via INTRAVENOUS

## 2015-10-13 MED ORDER — HEPARIN SODIUM (PORCINE) 5000 UNIT/ML IJ SOLN
5000.0000 [IU] | Freq: Three times a day (TID) | INTRAMUSCULAR | Status: DC
  Administered 2015-10-13 – 2015-10-15 (×6): 5000 [IU] via SUBCUTANEOUS
  Filled 2015-10-13 (×5): qty 1

## 2015-10-13 MED ORDER — PIPERACILLIN-TAZOBACTAM 3.375 G IVPB 30 MIN
3.3750 g | Freq: Once | INTRAVENOUS | Status: AC
  Administered 2015-10-13: 3.375 g via INTRAVENOUS
  Filled 2015-10-13: qty 50

## 2015-10-13 MED ORDER — SODIUM CHLORIDE 0.9 % IV SOLN
INTRAVENOUS | Status: DC
  Administered 2015-10-13: 19:00:00 via INTRAVENOUS

## 2015-10-13 MED ORDER — ASPIRIN 81 MG PO CHEW
81.0000 mg | CHEWABLE_TABLET | Freq: Every day | ORAL | Status: DC
  Administered 2015-10-13 – 2015-10-15 (×3): 81 mg via ORAL
  Filled 2015-10-13 (×3): qty 1

## 2015-10-13 MED ORDER — SODIUM CHLORIDE 0.9 % IV BOLUS (SEPSIS)
250.0000 mL | Freq: Once | INTRAVENOUS | Status: AC
  Administered 2015-10-13: 250 mL via INTRAVENOUS

## 2015-10-13 MED ORDER — ACETAMINOPHEN 325 MG PO TABS
650.0000 mg | ORAL_TABLET | Freq: Once | ORAL | Status: AC
  Administered 2015-10-13: 650 mg via ORAL
  Filled 2015-10-13: qty 2

## 2015-10-13 MED ORDER — SENNOSIDES-DOCUSATE SODIUM 8.6-50 MG PO TABS: 2.0000 | ORAL_TABLET | Freq: Every evening | ORAL | Status: DC | PRN

## 2015-10-13 MED ORDER — SODIUM CHLORIDE 0.9% FLUSH: 3.0000 mL | Freq: Two times a day (BID) | INTRAVENOUS | Status: DC

## 2015-10-13 MED ORDER — SODIUM CHLORIDE 0.9 % IV BOLUS (SEPSIS)
1000.0000 mL | Freq: Once | INTRAVENOUS | Status: AC
  Administered 2015-10-13: 1000 mL via INTRAVENOUS

## 2015-10-13 MED ORDER — VANCOMYCIN HCL IN DEXTROSE 750-5 MG/150ML-% IV SOLN: 750.0000 mg | INTRAVENOUS | Status: DC

## 2015-10-13 MED ORDER — PREDNISONE 20 MG PO TABS
40.0000 mg | ORAL_TABLET | Freq: Every day | ORAL | Status: DC
  Administered 2015-10-14 – 2015-10-15 (×2): 40 mg via ORAL
  Filled 2015-10-13 (×2): qty 2

## 2015-10-13 MED ORDER — PIPERACILLIN-TAZOBACTAM 3.375 G IVPB
3.3750 g | Freq: Three times a day (TID) | INTRAVENOUS | Status: DC
  Administered 2015-10-13 – 2015-10-14 (×2): 3.375 g via INTRAVENOUS
  Filled 2015-10-13 (×4): qty 50

## 2015-10-13 MED ORDER — LEVOTHYROXINE SODIUM 50 MCG PO TABS
50.0000 ug | ORAL_TABLET | Freq: Every day | ORAL | Status: DC
  Administered 2015-10-14 – 2015-10-15 (×2): 50 ug via ORAL
  Filled 2015-10-13 (×2): qty 1

## 2015-10-13 MED ORDER — ONDANSETRON 4 MG PO TBDP: 4.0000 mg | ORAL_TABLET | Freq: Three times a day (TID) | ORAL | Status: DC | PRN

## 2015-10-13 MED ORDER — MORPHINE SULFATE ER 15 MG PO TBCR
15.0000 mg | EXTENDED_RELEASE_TABLET | Freq: Every day | ORAL | Status: DC
  Administered 2015-10-13 – 2015-10-15 (×3): 15 mg via ORAL
  Filled 2015-10-13 (×3): qty 1

## 2015-10-13 MED ORDER — SODIUM CHLORIDE 0.9 % IV BOLUS (SEPSIS)
1000.0000 mL | Freq: Once | INTRAVENOUS | Status: AC
Start: 2015-10-13 — End: 2015-10-13
  Administered 2015-10-13: 1000 mL via INTRAVENOUS

## 2015-10-13 MED ORDER — PANTOPRAZOLE SODIUM 40 MG PO TBEC
40.0000 mg | DELAYED_RELEASE_TABLET | Freq: Every day | ORAL | Status: DC
  Administered 2015-10-13 – 2015-10-15 (×3): 40 mg via ORAL
  Filled 2015-10-13 (×3): qty 1

## 2015-10-13 NOTE — Progress Notes (Signed)
Pharmacy Antibiotic Note  [name] is a 82 y.o. female admitted on 10/13/2015 with sepsis.  Pharmacy has been consulted for vancomycin and zosyn dosing. Tmax 100.2, WBC is WNL and Scr is WNL.   Plan: - Vanc 1gm IV x 1 then 750mg  IV Q24H - Zosyn 3.375gm IV Q8H (4 hr inf) - F/u renal fxn, C&S, clinical status and trough at SS  Height: 5\' 4"  (162.6 cm) Weight: 112 lb (50.803 kg) IBW/kg (Calculated) : 54.7  Temp (24hrs), Avg:100.2 F (37.9 C), Min:100.2 F (37.9 C), Max:100.2 F (37.9 C)   Recent Labs Lab 10/13/15 1240  WBC 9.5  CREATININE 0.96    Estimated Creatinine Clearance: 31.9 mL/min (by C-G formula based on Cr of 0.96).    Allergies  Allergen Reactions  . Lactose Intolerance (Gi) Other (See Comments)    intolerance    Antimicrobials this admission: Vanc 2/6>> Zosyn 2/6>>  Dose adjustments this admission: N/A  Microbiology results: Pending  Thank you for allowing pharmacy to be a part of this patient's care.  Yamen Castrogiovanni, Rande Lawman 10/13/2015 2:16 PM

## 2015-10-13 NOTE — Progress Notes (Signed)
Pt blood pressure 94/69 notified MD, 1000 mL bolus ordered. Will continue to monitor.    [name] A  10/13/2015 7:00 PM

## 2015-10-13 NOTE — Progress Notes (Signed)
Pharmacy Code Sepsis Protocol  Time of code sepsis page: 1205 [x]  Antibiotics delivered at 1223 []  Antibiotics administered prior to code at  (if checked, omit next 2 questions)  Were antibiotics ordered at the time of the code sepsis page? No Was it required to contact the physician? []  Physician not contacted [x]  Physician contacted to order antibiotics for code sepsis []  Physician contacted to recommend changing antibiotics  Pharmacy consulted for: vancomycin and zosyn  Anti-infectives    Start     Dose/Rate Route Frequency Ordered Stop   10/13/15 1230  piperacillin-tazobactam (ZOSYN) IVPB 3.375 g     3.375 g 100 mL/hr over 30 Minutes Intravenous  Once 10/13/15 1219     10/13/15 1230  vancomycin (VANCOCIN) IVPB 1000 mg/200 mL premix     1,000 mg 200 mL/hr over 60 Minutes Intravenous  Once 10/13/15 1219          Nurse education provided: []  Minutes left to administer antibiotics to achieve 1 hour goal [x]  Correct order of antibiotic administration [x]  Antibiotic Y-site compatibilities     Flemon Kelty, Rande Lawman, PharmD 10/13/2015, 12:26 PM

## 2015-10-13 NOTE — ED Provider Notes (Signed)
CSN: [id number]     Arrival date & time 10/13/15  1134 History   First MD Initiated Contact with Patient 10/13/15 1140     Chief Complaint  Patient presents with  . Emesis  . Aspiration   HPI   82 year old female presents today with fever, cough, nausea, vomiting and diarrhea. Patient resides at [facility] [place]. Patient's daughter is at bedside reports that nursing staff noted patient had coughing last night with an episode of vomiting and diarrhea. They were concerned for aspiration as her oxygen saturation had dropped shortly after the coughing fit and aspiration. Patient notes that starting last night she's felt extremely weak, tired, thirsty with a fever. She reports several episodes of vomiting and diarrhea throughout the night, with nonproductive cough. Patient reports chronic back and abdominal pain, notes that this has not changed significantly, she denies any urinary complaints. Patient has a history of IBS with alterations between constipation and diarrhea, usually not associated with vomiting.   Past Medical History  Diagnosis Date  . Mitral valve prolapse   . Hiatal hernia   . Diverticulitis   . Orthostatic hypotension   . Vertigo   . Osteoporosis   . Diverticulosis   . Hypothyroidism   . GERD (gastroesophageal reflux disease)   . Atrial fibrillation ([place])   . COPD (chronic obstructive pulmonary disease) ([place])     "not bothered w/it anymore" (11/20/2012)  . Osteoarthritis   . Rheumatoid arthritis(714.0)   . Chronic lower back pain     "from a ruptured lower disc" (11/20/2012)  . Carcinoma ([place])     "top of left leg, had it taken off; right cheek, there now" (11/20/2012)   Past Surgical History  Procedure Laterality Date  . Back surgery    . Tonsillectomy  ~ 1933  . Fixation kyphoplasty thoracic spine  ~ 2011    "after falling and breaking 2 vertebra" (11/20/2012)  . Tendon repair Right ~ 2009    wrist (11/20/2012)  . Skin cancer excision Left ~ 2007    "top of my lower  leg" (11/20/2012)   Family History  Problem Relation Age of Onset  . Cancer Mother     bladder  . Hyperlipidemia Sister   . Stroke Maternal Aunt    Social History  Substance Use Topics  . Smoking status: Former Smoker -- 0.12 packs/day for 40 years    Types: Cigarettes    Quit date: 09/06/1985  . Smokeless tobacco: Never Used     Comment: 11/20/2012 "stopped smoking 27 yr ago"  . Alcohol Use: No   OB History    No data available     Review of Systems  All other systems reviewed and are negative.   Allergies  Lactose intolerance (gi)  Home Medications   Prior to Admission medications   Medication Sig Start Date End Date Taking? Authorizing Provider  aspirin 81 MG chewable tablet Chew 81 mg by mouth daily.   Yes Historical Provider, MD  calcium carbonate (TUMS - DOSED IN MG ELEMENTAL CALCIUM) 500 MG chewable tablet Chew 1 tablet by mouth daily.   Yes Historical Provider, MD  cyanocobalamin 100 MCG tablet Take 100 mcg by mouth daily.   Yes Historical Provider, MD  diclofenac sodium (VOLTAREN) 1 % GEL Apply 2 g topically every 6 (six) hours as needed.   Yes Historical Provider, MD  folic acid (FOLVITE) 1 MG tablet Take 1 mg by mouth daily.   Yes Historical Provider, MD  gabapentin (NEURONTIN) 100 MG capsule  Take 100 mg by mouth 2 (two) times daily. 07/15/14  Yes Historical Provider, MD  hydroxychloroquine (PLAQUENIL) 200 MG tablet Take 200 mg by mouth daily.   Yes Historical Provider, MD  hydroxypropyl methylcellulose (ISOPTO TEARS) 2.5 % ophthalmic solution Place 2 drops into both eyes 2 (two) times daily.   Yes Historical Provider, MD  levothyroxine (SYNTHROID, LEVOTHROID) 50 MCG tablet Take 50 mcg by mouth daily. 07/13/14  Yes Historical Provider, MD  magnesium oxide (MAG-OX) 400 MG tablet Take 400 mg by mouth daily.   Yes Historical Provider, MD  morphine (MS CONTIN) 15 MG 12 hr tablet Take 15 mg by mouth daily. 07/05/14  Yes Historical Provider, MD  oxyCODONE (OXY  IR/ROXICODONE) 5 MG immediate release tablet Take 5 mg by mouth every 4 (four) hours as needed for severe pain.   Yes Historical Provider, MD  pantoprazole (PROTONIX) 40 MG tablet Take 40 mg by mouth daily.    Yes Historical Provider, MD  predniSONE (DELTASONE) 5 MG tablet Take 5 mg by mouth daily.  11/17/10  Yes Historical Provider, MD  senna-docusate (SENOKOT-S) 8.6-50 MG per tablet Take 2 tablets by mouth at bedtime as needed (contipation.). May have 1 tablet extra daily as needed   Yes Historical Provider, MD  traMADol (ULTRAM) 50 MG tablet Take 50 mg by mouth every 6 (six) hours as needed for moderate pain.   Yes Historical Provider, MD  Vitamin D, Ergocalciferol, (DRISDOL) 50000 units CAPS capsule Take 50,000 Units by mouth every 7 (seven) days.   Yes Historical Provider, MD  acetaminophen (TYLENOL) 325 MG tablet Take 650 mg by mouth every 4 (four) hours as needed for mild pain or fever. For temperature of 99 or greater. Not to exceed 3000 mg per day. Contact regional provider within 24 hours of fever onset.    Historical Provider, MD  Lactulose 20 GM/30ML SOLN Take 30 mLs (20 g total) by mouth 2 (two) times daily. Patient not taking: Reported on 10/13/2015 04/05/14   Daleen Bo, MD  leflunomide (ARAVA) 10 MG tablet Take 10 mg by mouth daily.    Historical Provider, MD  magnesium hydroxide (MILK OF MAGNESIA) 400 MG/5ML suspension Take 30 mLs by mouth daily as needed (constipation).    Historical Provider, MD  meclizine (ANTIVERT) 12.5 MG tablet Take 12.5 mg by mouth 3 (three) times daily as needed for dizziness (vertigo).     Historical Provider, MD  OxyCODONE (OXYCONTIN) 10 mg T12A 12 hr tablet Take 10 mg by mouth every morning.    Historical Provider, MD  promethazine (PHENERGAN) 25 MG tablet Take 25 mg by mouth every 4 (four) hours as needed for nausea or vomiting.    Historical Provider, MD   BP 112/47 mmHg  Pulse 102  Temp(Src) 100.2 F (37.9 C)  Resp 18  Ht 5\' 4"  (1.626 m)  Wt 50.803  kg  BMI 19.22 kg/m2  SpO2 96%   Physical Exam  Constitutional: She is oriented to person, place, and time. She appears well-developed and well-nourished.  Patient appears dehydrated with dry mucous membranes  HENT:  Head: Normocephalic and atraumatic.  Eyes: Conjunctivae are normal. Pupils are equal, round, and reactive to light. Right eye exhibits no discharge. Left eye exhibits no discharge. No scleral icterus.  Neck: Normal range of motion. No JVD present. No tracheal deviation present.  Cardiovascular: Regular rhythm, normal heart sounds and intact distal pulses.  Exam reveals no gallop and no friction rub.   No murmur heard. Pulmonary/Chest: Effort normal. No  stridor. No respiratory distress. She has no wheezes. She has no rales. She exhibits no tenderness.  Poor inspiratory effort  Abdominal: She exhibits no distension and no mass. There is no tenderness. There is no rebound and no guarding.  Musculoskeletal: Normal range of motion. She exhibits no edema or tenderness.  Neurological: She is alert and oriented to person, place, and time. Coordination normal.  Skin:  No signs of skin breakdown or cellulitis  Psychiatric: She has a normal mood and affect. Her behavior is normal. Judgment and thought content normal.  Nursing note and vitals reviewed.   ED Course  Procedures (including critical care time) Labs Review Labs Reviewed  COMPREHENSIVE METABOLIC PANEL - Abnormal; Notable for the following:    Chloride 98 (*)    Glucose, Bld 139 (*)    Total Protein 6.3 (*)    GFR calc non Af Amer 51 (*)    GFR calc Af Amer 59 (*)    All other components within normal limits  CBC WITH DIFFERENTIAL/PLATELET - Abnormal; Notable for the following:    MCV 105.7 (*)    MCH 34.4 (*)    Neutro Abs 8.0 (*)    All other components within normal limits  CULTURE, BLOOD (ROUTINE X 2)  CULTURE, BLOOD (ROUTINE X 2)  URINE CULTURE  URINALYSIS, ROUTINE W REFLEX MICROSCOPIC (NOT AT [facility])  I-STAT  CG4 LACTIC ACID, ED  I-STAT CG4 LACTIC ACID, ED    Imaging Review Dg Chest Port 1 View  10/13/2015  CLINICAL DATA:  Shortness of breath and hypertension EXAM: PORTABLE CHEST 1 VIEW COMPARISON:  April 05, 2014 FINDINGS: There is bibasilar lung scarring. Lungs elsewhere clear. Heart size and pulmonary vascularity are normal. No adenopathy. Patient has undergone kyphoplasty procedure at T12, stable. IMPRESSION: Bibasilar lung scarring.  No edema or consolidation. Electronically Signed   By: Lowella Grip III M.D.   On: 10/13/2015 13:22   I have personally reviewed and evaluated these images and lab results as part of my medical decision-making.   EKG Interpretation   Date/Time:  Monday October 13 2015 11:51:37 EST Ventricular Rate:  124 PR Interval:  176 QRS Duration: 108 QT Interval:  297 QTC Calculation: 426 R Axis:   -79 Text Interpretation:  Sinus tachycardia Left ventricular hypertrophy  Inferior infarct, old Posterior infarct, acute (LCx) Artifact in lead(s) I  II III aVR aVL V1 V2 Sinus tachycardia ST-t wave abnormality Artifact  Abnormal ekg Confirmed by Carmin Muskrat  MD [phone]) on 10/13/2015 11:59:36  AM      MDM   Final diagnoses:  Sepsis ([place])  Chronic obstructive pulmonary disease, unspecified COPD type ([place])    Labs: CMP, CBC, blood culture, urinalysis, urine culture- no significant findings  Imaging: DG chest 1 view no edema or consolidation  Consults: medicine  Therapeutics: Normal saline, Zosyn, vancomycin  Discharge Meds:   Assessment/Plan: 82 year old female presents today with fever, tachycardia, hypoxemia. Patient was initially started on sepsis protocols. Urinalysis showed no infectious etiology, chest x-ray was read as normal, although it appears there may be a right lower lobe effusion. Patient has had nausea vomiting diarrhea, no episodes here in the ED. Today's hypoxemia could likely be caused by COPD exacerbation, gastroenteritis, sepsis.  Patient will be admitted to hospital for further evaluation and management.         Okey Regal, PA-C 10/13/15 [place], MD 10/18/15 [phone]

## 2015-10-13 NOTE — ED Notes (Signed)
Pt started having weakness yesterday along with n/v diarrhea. Pt from [facility]. Staff noticed pt had vomited this morning and were concerned she may have aspirated. Pt alert and oriented. EMS reports that initial sats in 80's room air. Placed on 4L [place] and sats mid 90's. Pt told her family she is ready to die. Pt has DNR form. EMS gave pt 4mg  zofran for vomiting. EMS states BP 156/84, HR 120, resp 24, 95% on 4L [place]. CBG 134.

## 2015-10-13 NOTE — H&P (Signed)
[facility] Admission History and Physical Service Pager: [phone]  Patient name: [name] record number: [id number] Date of birth: 02/27/1924 Age: 82 y.o. Gender: female  Primary Care Provider: Willodean Rosenthal, MD Consultants: None Code Status: DNR  Chief Complaint: AMS, fever, tachycardia   Assessment and Plan: [name] is a 82 y.o. female presenting with AMS, fever, and tachycardia for sepsis work-up. PMH is significant for COPD, hypothyroidism, RA, hx a.fib.   1. Sepsis rule out: Altered mental status with hypoxia, as well as tachycardia and fever on admission. May be 2/2 aspiration pneumonia given reported incident at ALF vs other process. Portable CXR in ED was read as no consolidations, however upon personal interpretation there appears to be an opacity in RLL. WBC WNL, glucose 139. Patient does report daily dysuria, however UA does not suggest UTI. Urine and blood cultures pending. Patient is also quite dehydrated, which may be contributing to AMS and tachycardia.EKG in ED with significant artifact. Vanc and zosyn started in ED per sepsis protocol. Patient is only oriented to person, but no other neuro deficits are present. Tachycardia and mental status now beginning to improve with fluid administration. - Admit to floor, attending Dr. Ree Kida - Discontinue vanc - Continue zosyn. Consider weaning to PO tomorrow.  - F/u blood, urine cultures - Repeat EKG - Troponin given reported chest pain - 2-view CXR - Speech therapy ordered for possible aspiration - Consider CT head if worsens   2. Hypothyroidism - Continue home Synthroid 50 mcg - TSH  3. Rheumatoid arthritis: on prednisone 5 mg qd and Plaquenil - Continue home Plaquenil - Stress dose prednisone 40 mg  4. COPD - Stress dose prednisone 40 mg   5. Shoulder pain - Continue home oxycontin, gabapentin - Tylenol PRN  FEN/GI: Regular diet, NS@100  mL/hr, Protonix Prophylaxis:  subQ heparin  Disposition: admit for sepsis work-up  History of Present Illness:  [name] is a 82 y.o. female presenting for sepsis work-up.   Patient presented from [facility] assisted living facility this AM. The patient is not entirely oriented and is thus poor historian, and no one present at the event is with her during the encounter. However, per the patient, report her grandson (at bedside) has received, and ED report, the patient vomited this AM, and nurses at her ALF were concerned for aspiration. EMS was called, and patient's O2 sat was in low 80s after potential aspiration event. ED report mentions nausea, vomiting, and diarrhea beginning yesterday, however patient and her grandson (who was with her yesterday) deny any nausea or vomiting until the event this morning.   The patient's grandson was with her at [facility] until the early afternoon yesterday, and reports that she was feeling fine then. She had had no episodes of coughing, nausea, or vomiting at that time. He reports that she was at her baseline mental status. He says she is normally very spry, and her weakness and mental status today is not her baseline.   Patient reports feeling weaker than usual today, as well as confused. She denies pain, except for the typical pain she has in her shoulders. She reports burning with urination first thing in the morning, but says this is a daily occurrence and is not new. She reports chest pain when coughing, but not otherwise. Denies SOB or any respiratory difficulties.   Upon arrival to the ED, patient was found to have elevated temp to 100.72F, with tachycardia to 126. Sepsis protocol  was initiated, and patient was subsequently admitted for further work-up.   Review Of Systems:  Otherwise the remainder of the systems were negative.  Patient Active Problem List   Diagnosis Date Noted  . Pneumonia 10/13/2015  . Syncope and collapse 11/20/2012  . Hypotension 11/20/2012  .  Nausea and vomiting 11/20/2012  . Urinary retention 11/20/2012  . Dysuria R/O UTI 11/20/2012  . Anemia 11/20/2012  . GERD (gastroesophageal reflux disease) 11/20/2012  . Dehydration 01/06/2012  . Gastroenteritis 01/06/2012  . Gait instability 01/06/2012  . Arthritis   . Mitral valve prolapse   . Hiatal hernia   . Diverticulitis   . COPD (chronic obstructive pulmonary disease) ([place])   . Thyroid disease   . Atrial fibrillation ([place])   . Orthostatic hypotension   . Vertigo   . Osteoporosis   . Diverticulosis   . Syncope 08/12/2011  . CARCINOMA, BASAL CELL 06/11/2010  . Rheumatoid arthritis(714.0) 05/22/2009  . Unspecified hypothyroidism 05/20/2009  . ABDOMINAL PAIN 05/17/2009    Past Medical History: Past Medical History  Diagnosis Date  . Mitral valve prolapse   . Hiatal hernia   . Diverticulitis   . Orthostatic hypotension   . Vertigo   . Osteoporosis   . Diverticulosis   . Hypothyroidism   . GERD (gastroesophageal reflux disease)   . Atrial fibrillation ([place])   . COPD (chronic obstructive pulmonary disease) ([place])     "not bothered w/it anymore" (11/20/2012)  . Osteoarthritis   . Rheumatoid arthritis(714.0)   . Chronic lower back pain     "from a ruptured lower disc" (11/20/2012)  . Carcinoma ([place])     "top of left leg, had it taken off; right cheek, there now" (11/20/2012)    Past Surgical History: Past Surgical History  Procedure Laterality Date  . Back surgery    . Tonsillectomy  ~ 1933  . Fixation kyphoplasty thoracic spine  ~ 2011    "after falling and breaking 2 vertebra" (11/20/2012)  . Tendon repair Right ~ 2009    wrist (11/20/2012)  . Skin cancer excision Left ~ 2007    "top of my lower leg" (11/20/2012)    Social History: Social History  Substance Use Topics  . Smoking status: Former Smoker -- 0.12 packs/day for 40 years    Types: Cigarettes    Quit date: 09/06/1985  . Smokeless tobacco: Never Used     Comment: 11/20/2012 "stopped smoking 27 yr  ago"  . Alcohol Use: No   Additional social history: lives at [facility] assisted living facility.  Please also refer to relevant sections of EMR.  Family History: Family History  Problem Relation Age of Onset  . Cancer Mother     bladder  . Hyperlipidemia Sister   . Stroke Maternal Aunt     Allergies and Medications: Allergies  Allergen Reactions  . Lactose Intolerance (Gi) Other (See Comments)    intolerance   Current Facility-Administered Medications on File Prior to Encounter  Medication Dose Route Frequency Provider Last Rate Last Dose  . NON FORMULARY   Injection 1 day or 1 dose Hali Marry, MD      . NON FORMULARY   Injection 1 day or 1 dose Hali Marry, MD       Current Outpatient Prescriptions on File Prior to Encounter  Medication Sig Dispense Refill  . aspirin 81 MG chewable tablet Chew 81 mg by mouth daily.    . calcium carbonate (TUMS - DOSED IN MG ELEMENTAL CALCIUM)  500 MG chewable tablet Chew 1 tablet by mouth daily.    . cyanocobalamin 100 MCG tablet Take 100 mcg by mouth daily.    [name] gabapentin (NEURONTIN) 100 MG capsule Take 100 mg by mouth 2 (two) times daily.    . hydroxypropyl methylcellulose (ISOPTO TEARS) 2.5 % ophthalmic solution Place 2 drops into both eyes 2 (two) times daily.    [name] levothyroxine (SYNTHROID, LEVOTHROID) 50 MCG tablet Take 50 mcg by mouth daily.    . magnesium oxide (MAG-OX) 400 MG tablet Take 400 mg by mouth daily.    [name] morphine (MS CONTIN) 15 MG 12 hr tablet Take 15 mg by mouth daily.    . pantoprazole (PROTONIX) 40 MG tablet Take 40 mg by mouth daily.     . predniSONE (DELTASONE) 5 MG tablet Take 5 mg by mouth daily.     [name] senna-docusate (SENOKOT-S) 8.6-50 MG per tablet Take 2 tablets by mouth at bedtime as needed (contipation.). May have 1 tablet extra daily as needed    . acetaminophen (TYLENOL) 325 MG tablet Take 650 mg by mouth every 4 (four) hours as needed for mild pain or fever. For temperature of 99 or  greater. Not to exceed 3000 mg per day. Contact regional provider within 24 hours of fever onset.    . Lactulose 20 GM/30ML SOLN Take 30 mLs (20 g total) by mouth 2 (two) times daily. (Patient not taking: Reported on 10/13/2015) 500 mL 0  . leflunomide (ARAVA) 10 MG tablet Take 10 mg by mouth daily.    . magnesium hydroxide (MILK OF MAGNESIA) 400 MG/5ML suspension Take 30 mLs by mouth daily as needed (constipation).    . meclizine (ANTIVERT) 12.5 MG tablet Take 12.5 mg by mouth 3 (three) times daily as needed for dizziness (vertigo).     . OxyCODONE (OXYCONTIN) 10 mg T12A 12 hr tablet Take 10 mg by mouth every morning.    . promethazine (PHENERGAN) 25 MG tablet Take 25 mg by mouth every 4 (four) hours as needed for nausea or vomiting.      Objective: BP 112/47 mmHg  Pulse 102  Temp(Src) 100.2 F (37.9 C)  Resp 18  Ht 5\' 4"  (1.626 m)  Wt 112 lb (50.803 kg)  BMI 19.22 kg/m2  SpO2 96% Exam: General: well-appearing pleasant elderly female in NAD lying in bed; grandson at bedside. Repeatedly asks where she is throughout the encounter.  Eyes: PERRLA, no scleral icterus or erythema ENTM: poor dentition, dry oral mucosa, no oropharyngeal erythema or exudates Neck: supple, no lymphadenopathy Cardiovascular: RRR, no murmurs appreciated Respiratory: Breathing comfortably on 4L [place]. CTAB, but possible diminished breath sounds in RLL. No wheezes or rhonchi.  Abdomen: soft, non-tender, non-distended, +BS MSK: 4/5 strength bilaterally upper and lower extremities; +DP pulses bilaterally Skin: dry flaking skin on feet; small multiple ecchymoses along lower extremities though no ulcers or signs of skin breakdown Neuro: oriented to person, but not to place or time; no focal neuro deficits Psych: appropriate mood and affect  Labs and Imaging: CBC BMET   Recent Labs Lab 10/13/15 1240  WBC 9.5  HGB 13.4  HCT 41.1  PLT 288    Recent Labs Lab 10/13/15 1240  NA 138  K 4.1  CL 98*  CO2 26  BUN  17  CREATININE 0.96  GLUCOSE 139*  CALCIUM 9.0     Dg Chest Port 1 View 10/13/2015 IMPRESSION: Bibasilar lung scarring. No edema or consolidation.    Verner Mould, MD 10/13/2015,  3:49 PM PGY-1, [place] Intern pager: [phone], text pages welcome

## 2015-10-13 NOTE — ED Notes (Signed)
Pt has heart healthy signed and held order. RN will give pt some crackers and sandwich and water

## 2015-10-13 NOTE — Progress Notes (Signed)
New Admission Note:   Arrival Method: stretcher Mental Orientation: Answers questions appropriately (person, place) but forgetful Skin:Telemetry: box 9, verified by nt IV: right ac Pain: no complaints Tubes: 02 @ 5L  Safety Measures: Safety Fall Prevention Plan has been given, discussed and signed Admission: Completed [address] [place] Orientation: Patient has been orientated to the room, unit and staff.  Family: Son-in-law at bedside   Orders have been reviewed and implemented. Will continue to monitor the patient. Call light has been placed within reach and bed alarm has been activated.   Alcide Evener BSN, RN Phone number: [phone]

## 2015-10-13 NOTE — Progress Notes (Signed)
Family Medicine Teaching Service Daily Progress Note Intern Pager: [phone]  Patient name: [name]: [id number] Date of birth: 1924-03-13 Age: 82 y.o. Gender: female  Primary Care Provider: Willodean Rosenthal, MD Consultants: none Code Status: DNR  Pt Overview and Major Events to Date:  2/6 - admitted for sepsis rule out; zosyn started   Assessment and Plan: [name] is a 82 y.o. female presenting with AMS, fever, and tachycardia for sepsis work-up. PMH is significant for COPD, hypothyroidism, RA, hx a.fib.   1. Sepsis rule out: Altered mental status with hypoxia, as well as tachycardia and fever on admission. May be 2/2 aspiration pneumonia given reported incident at ALF vs other process. Portable CXR in ED was read as no consolidations, however upon personal interpretation there appears to be an opacity in RLL. WBC WNL, glucose 139. Patient does report daily dysuria, however UA does not suggest UTI. Urine and blood cultures pending. Patient is also quite dehydrated, which may be contributing to AMS and tachycardia. Troponin 0.04. Vanc and zosyn started in ED per sepsis protocol. Patient's AMS has completely resolved. - Continue zosyn. Consider weaning to PO today.  - F/u blood, urine cultures - Speech therapy ordered for possible aspiration - Consider CT head if worsens  - Repeat CXR, EKG  2. Hypothyroidism: TSH WNL at 0.78. - Continue home Synthroid 50 mcg  3. Rheumatoid arthritis: on prednisone 5 mg qd and Plaquenil - Continue home Plaquenil - Stress dose prednisone 40 mg  4. COPD - Stress dose prednisone 40 mg   5. Shoulder pain - Continue home oxycontin, gabapentin - Tylenol PRN  FEN/GI: Regular diet, NS@100  mL/hr, Protonix Prophylaxis: subQ heparin  Disposition: return to ALF pending medical improvement   Subjective:  Patient says she is feeling much better this AM. She says her weakness has improved, and she no longer feels confused. Her  coughing has resolved. She is still on 5L O2, however says she is breathing comfortably. Patient is completely oriented this AM, and her daughter who is at bedside says she's back to baseline.    Objective: Temp:  [97.7 F (36.5 C)-100.2 F (37.9 C)] 98.8 F (37.1 C) (02/07 0438) Pulse Rate:  [81-104] 81 (02/07 0438) Resp:  [12-38] 16 (02/07 0438) BP: (94-159)/(39-117) 109/41 mmHg (02/07 0438) SpO2:  [90 %-100 %] 97 % (02/07 0438) Weight:  [112 lb (50.803 kg)-114 lb 3.2 oz (51.8 kg)] 114 lb 3.2 oz (51.8 kg) (02/06 1837) Physical Exam: General: sitting in bed eating breakfast, in NAD; daughter at bedside Cardiovascular: RRR, no murmurs appreciated Respiratory: CTAB, good air movement; on 5L [place] Abdomen: soft, non-tender, non-distended, +BS Extremities: moving all spontaneously Neuro: A&Ox4, no focal deficits  Laboratory:  Recent Labs Lab 10/13/15 1240  WBC 9.5  HGB 13.4  HCT 41.1  PLT 288    Recent Labs Lab 10/13/15 1240 10/14/15 0543  NA 138 134*  K 4.1 3.6  CL 98* 103  CO2 26 21*  BUN 17 13  CREATININE 0.96 0.84  CALCIUM 9.0 7.4*  PROT 6.3*  --   BILITOT 0.8  --   ALKPHOS 58  --   ALT 24  --   AST 34  --   GLUCOSE 139* 88    Imaging/Diagnostic Tests: Dg Chest Port 1 View (10/13/2015) IMPRESSION: Bibasilar lung scarring.  No edema or consolidation.   Verner Mould, MD 10/14/2015, 8:31 AM PGY-1, [place] Intern pager: [phone], text pages welcome

## 2015-10-14 ENCOUNTER — Inpatient Hospital Stay (HOSPITAL_COMMUNITY): Payer: Medicare Other

## 2015-10-14 DIAGNOSIS — T17998D Other foreign object in respiratory tract, part unspecified causing other injury, subsequent encounter: Secondary | ICD-10-CM

## 2015-10-14 DIAGNOSIS — T17908A Unspecified foreign body in respiratory tract, part unspecified causing other injury, initial encounter: Secondary | ICD-10-CM | POA: Insufficient documentation

## 2015-10-14 LAB — URINE CULTURE: Culture: NO GROWTH

## 2015-10-14 LAB — CBC
HEMATOCRIT: 31.1 % — AB (ref 36.0–46.0)
Hemoglobin: 10.3 g/dL — ABNORMAL LOW (ref 12.0–15.0)
MCH: 34.8 pg — ABNORMAL HIGH (ref 26.0–34.0)
MCHC: 33.1 g/dL (ref 30.0–36.0)
MCV: 105.1 fL — AB (ref 78.0–100.0)
Platelets: 243 10*3/uL (ref 150–400)
RBC: 2.96 MIL/uL — ABNORMAL LOW (ref 3.87–5.11)
RDW: 13.7 % (ref 11.5–15.5)
WBC: 9.9 10*3/uL (ref 4.0–10.5)

## 2015-10-14 LAB — BASIC METABOLIC PANEL
ANION GAP: 10 (ref 5–15)
BUN: 13 mg/dL (ref 6–20)
CALCIUM: 7.4 mg/dL — AB (ref 8.9–10.3)
CO2: 21 mmol/L — AB (ref 22–32)
CREATININE: 0.84 mg/dL (ref 0.44–1.00)
Chloride: 103 mmol/L (ref 101–111)
GFR calc Af Amer: >60 mL/min (ref >60)
GFR, EST NON AFRICAN AMERICAN: 60 mL/min — AB (ref >60)
GLUCOSE: 88 mg/dL (ref 65–99)
Potassium: 3.6 mmol/L (ref 3.5–5.1)
Sodium: 134 mmol/L — ABNORMAL LOW (ref 135–145)

## 2015-10-14 LAB — TROPONIN I: Troponin I: 0.03 ng/mL (ref <0.031)

## 2015-10-14 MED ORDER — CETYLPYRIDINIUM CHLORIDE 0.05 % MT LIQD: 7.0000 mL | Freq: Two times a day (BID) | OROMUCOSAL | Status: DC

## 2015-10-14 MED ORDER — LEVOFLOXACIN 500 MG PO TABS
500.0000 mg | ORAL_TABLET | Freq: Every day | ORAL | Status: DC
  Administered 2015-10-14 – 2015-10-15 (×2): 500 mg via ORAL
  Filled 2015-10-14 (×2): qty 1

## 2015-10-14 NOTE — Progress Notes (Signed)
Family Medicine Teaching Service Daily Progress Note Intern Pager: [phone]  Patient name: [name] record number: [id number] Date of birth: Nov 15, 1923 Age: 82 y.o. Gender: female  Primary Care Provider: Willodean Rosenthal, MD Consultants: none Code Status: DNR  Pt Overview and Major Events to Date:  2/6 - admitted for sepsis rule out; zosyn started  2/7 - transitioned to levaquin  Assessment and Plan: [name] is a 82 y.o. female presenting with AMS, fever, and tachycardia for sepsis work-up. PMH is significant for COPD, hypothyroidism, RA, hx a.fib.   1. Sepsis rule out: Altered mental status with hypoxia, as well as tachycardia and fever on admission. Possibly 2/2 dehydration, as patient improved quickly with IVF administration. Initial concern for aspiration PNA, though quick improvement not suggestive of this and no evidence on chest xrays. Blood culture NGTD. Troponin 0.04> 0.03. Vanc and zosyn started in ED per sepsis protocol. Seen by speech therapy yesterday, regular diet. Patient's AMS has completely resolved per discussion with daughter. No blood cultures NGTD  - Continue levaquin for another 2 days for possible pneumonia treatment - Repeat EKG with right bundle branch, troponins 0.04> 0.03  2. Hypothyroidism: TSH WNL at 0.78. - Continue home Synthroid 50 mcg  3. Rheumatoid arthritis: on prednisone 5 mg qd and Plaquenil - Continue home Plaquenil - Stress dose prednisone 40 mg  4. COPD - Stress dose prednisone 40 mg  Day 3, then 10 mg for 2 weeks  5. Shoulder pain - Continue home oxycontin, gabapentin - Tylenol PRN  FEN/GI: Regular diet,Protonix Prophylaxis: subQ heparin  Disposition: return to ALF pending medical improvement   Subjective:  Patient doing well this morning. She was not able to tell me that she was at hospital, however she was quiet angry about being here and may have been refusing to cooperate. Daughter was in the room throughout the  interaction and states that this is her baseline . Denies chest pain, SOB, n/v, fever or chills, or abdominal pain.   Objective: Temp:  [97.7 F (36.5 C)-98.6 F (37 C)] 97.7 F (36.5 C) (02/08 0451) Pulse Rate:  [70-86] 74 (02/08 0451) Resp:  [18-21] 21 (02/08 0451) BP: (98-153)/(51-63) 153/63 mmHg (02/08 0451) SpO2:  [94 %-98 %] 94 % (02/08 0451) Weight:  [117 lb 15.1 oz (53.5 kg)] 117 lb 15.1 oz (53.5 kg) (02/07 2018) Physical Exam: General: sitting in bed eating breakfast, in NAD; daughter at bedside Cardiovascular: RRR, no murmurs appreciated Respiratory: CTAB, good air movement; room air  Abdomen: soft, non-tender, non-distended, +BS Extremities: moving all spontaneously Neuro: A&Ox2, knows her name and date, normal strength 5/5 upper and lower extremities   Laboratory:  Recent Labs Lab 10/13/15 1240 10/14/15 0543  WBC 9.5 9.9  HGB 13.4 10.3*  HCT 41.1 31.1*  PLT 288 243    Recent Labs Lab 10/13/15 1240 10/14/15 0543  NA 138 134*  K 4.1 3.6  CL 98* 103  CO2 26 21*  BUN 17 13  CREATININE 0.96 0.84  CALCIUM 9.0 7.4*  PROT 6.3*  --   BILITOT 0.8  --   ALKPHOS 58  --   ALT 24  --   AST 34  --   GLUCOSE 139* 88    Imaging/Diagnostic Tests: DG Chest Port 1 View (10/13/2015) - IMPRESSION: Bibasilar lung scarring.  No edema or consolidation.   DG Chest 2 View (10/14/2015) - IMPRESSION: 1. Hyperexpanded lungs suggesting COPD. Chronic scarring/fibrosis within each lung. Probable chronic bronchitic changes centrally. 2. No acute  findings. No evidence of pneumonia or aspiration pneumonitis.   [name] Lauren Media, MD 10/15/2015, 9:31 AM PGY-1, [place] Intern pager: [phone], text pages welcome

## 2015-10-14 NOTE — Progress Notes (Signed)
OT Cancellation Note  Patient Details Name: [name] MRN: [id number] DOB: [birth year]   Cancelled Treatment:    Reason Eval/Treat Not Completed: Patient at procedure or test/ unavailable. Pt in radiology, will try back at later time as schedule allows.  Almon Register [id number] 10/14/2015, 10:41 AM

## 2015-10-14 NOTE — Evaluation (Signed)
Physical Therapy Evaluation Patient Details Name: TRACY BRIGHTWELL MRN: [id number] DOB: 1923/09/19 Today's Date: 10/14/2015   History of Present Illness  [name] is a 82 y.o. female presenting with AMS, fever, and tachycardia for sepsis work-up. PMH is significant for COPD, hypothyroidism, RA, hx a.fib  Clinical Impression  Pt with the above medical history presents today from ALF. PTA pt ambulated with Rollator and was Independent other than cooking. Today pt reqiring Min Guard-Min A for OOB mobility limited by generalized weakness. Pt tolerated increased activity on 2L SpO2 with 95% saturation. Pt would benefit from continued skilled acute PT for balance and gait training before returning to ALF with HHPT/therapy services for safe transition to Mod I level of function.    Follow Up Recommendations Home health PT;Supervision - Intermittent    Equipment Recommendations  None recommended by PT    Recommendations for Other Services       Precautions / Restrictions Precautions Precautions: Fall Restrictions Weight Bearing Restrictions: No      Mobility  Bed Mobility Overal bed mobility: Needs Assistance Bed Mobility: Supine to Sit     Supine to sit: Min assist     General bed mobility comments: Physical Assist required to bring trunk upright, use of bed rails  Transfers Overall transfer level: Needs assistance Equipment used: Rolling walker (2 wheeled) Transfers: Sit to/from Stand Sit to Stand: Min assist         General transfer comment: Assist needed for initial boost and hand placement on RW, initially unstable with trunked flexed  Ambulation/Gait Ambulation/Gait assistance: Min assist Ambulation Distance (Feet): 175 Feet Assistive device: Rolling walker (2 wheeled) Gait Pattern/deviations: Step-through pattern;Decreased stride length;Shuffle;Trunk flexed Gait velocity: slow Gait velocity interpretation: <1.8 ft/sec, indicative of risk for recurrent  falls General Gait Details: stable with RW, minimal cueing to stay close to walker and stand upright which pt was able to maintain for about 20 seconds  Stairs            Wheelchair Mobility    Modified Rankin (Stroke Patients Only)       Balance Overall balance assessment: Needs assistance Sitting-balance support: Bilateral upper extremity supported;Feet supported Sitting balance-Leahy Scale: Fair     Standing balance support: Bilateral upper extremity supported;During functional activity Standing balance-Leahy Scale: Fair                               Pertinent Vitals/Pain Pain Assessment: No/denies pain    Home Living Family/patient expects to be discharged to:: Assisted living               Home Equipment: Walker - 4 wheels;Grab bars - toilet;Grab bars - tub/shower      Prior Function Level of Independence: Needs assistance   Gait / Transfers Assistance Needed: Independent with RW  ADL's / Homemaking Assistance Needed: Goes to dining hall for all meals  Comments: Pt states she walks a lot at ALF, unsure whether she is on O2 during the day/night     Hand Dominance        Extremity/Trunk Assessment   Upper Extremity Assessment: Generalized weakness           Lower Extremity Assessment: Generalized weakness      Cervical / Trunk Assessment: Kyphotic  Communication   Communication: No difficulties  Cognition Arousal/Alertness: Awake/alert Behavior During Therapy: WFL for tasks assessed/performed Overall Cognitive Status: Impaired/Different from baseline Area of Impairment: Orientation;Memory Orientation Level:  Disoriented to;Time   Memory: Decreased short-term memory              General Comments General comments (skin integrity, edema, etc.): on 2L O2, transferred on RA drop to 87%, replaced  ambualted at 94% with 2L O2    Exercises        Assessment/Plan    PT Assessment Patient needs continued PT services   PT Diagnosis Difficulty walking;Generalized weakness   PT Problem List Decreased strength;Decreased activity tolerance;Decreased balance;Decreased mobility;Decreased coordination  PT Treatment Interventions DME instruction;Gait training;Therapeutic activities;Therapeutic exercise;Balance training   PT Goals (Current goals can be found in the Care Plan section) Acute Rehab PT Goals Patient Stated Goal: get up PT Goal Formulation: With patient Time For Goal Achievement: 10/28/15 Potential to Achieve Goals: Good    Frequency Min 3X/week   Barriers to discharge        Co-evaluation               End of Session Equipment Utilized During Treatment: Gait belt;Oxygen Activity Tolerance: Patient tolerated treatment well Patient left: in chair;with call bell/phone within reach;Other (comment) (SLP in room) Nurse Communication: Mobility status         Time: [id number] PT Time Calculation (min) (ACUTE ONLY): 23 min   Charges:   PT Evaluation $PT Eval Moderate Complexity: 1 Procedure PT Treatments $Gait Training: 8-22 mins   PT G Codes:        Ara Kussmaul 10-15-2015, 1:32 PM Ara Kussmaul, Student Physical Therapist Acute Rehab [phone]

## 2015-10-14 NOTE — Progress Notes (Signed)
RN from IV team entered room when MD at bedside to start peripheral IV. MD changed abx to PO and stated pt did not need IV.

## 2015-10-14 NOTE — Progress Notes (Signed)
Unable to complete admission questions this shift due to patient's altered mental status.

## 2015-10-14 NOTE — Progress Notes (Signed)
Utilization review completed. Tayvion Lauder, RN, BSN. 

## 2015-10-14 NOTE — Discharge Summary (Signed)
[facility] Discharge Summary  Patient name: [name] record number: [id number] Date of birth: [birth year] Age: 82 y.o. Gender: female Date of Admission: 10/13/2015  Date of Discharge: 10/15/2015  Admitting Physician: Lupita Dawn, MD  Primary Care Provider: Willodean Rosenthal, MD Consultants: None  Indication for Hospitalization: AMS, desat, tachycardia; sepsis rule out  Discharge Diagnoses/Problem List:  Patient Active Problem List   Diagnosis Date Noted  . Aspiration into airway   . Pneumonia 10/13/2015  . Aspiration pneumonia ([place]) 10/13/2015  . Sepsis ([place])   . Encephalopathy   . Thyroid activity decreased   . Long term current use of systemic steroids   . Chronic obstructive pulmonary disease ([place])   . Syncope and collapse 11/20/2012  . Hypotension 11/20/2012  . Nausea and vomiting 11/20/2012  . Urinary retention 11/20/2012  . Dysuria R/O UTI 11/20/2012  . Anemia 11/20/2012  . GERD (gastroesophageal reflux disease) 11/20/2012  . Dehydration 01/06/2012  . Gastroenteritis 01/06/2012  . Gait instability 01/06/2012  . Arthritis   . Mitral valve prolapse   . Hiatal hernia   . Diverticulitis   . COPD (chronic obstructive pulmonary disease) ([place])   . Thyroid disease   . Atrial fibrillation ([place])   . Orthostatic hypotension   . Vertigo   . Osteoporosis   . Diverticulosis   . Syncope 08/12/2011  . CARCINOMA, BASAL CELL 06/11/2010  . Rheumatoid arthritis(714.0) 05/22/2009  . Unspecified hypothyroidism 05/20/2009  . ABDOMINAL PAIN 05/17/2009   Disposition: return to ALF [facility])  Discharge Condition: improved  Discharge Exam: General: sitting in bed eating breakfast, in NAD; daughter at bedside Cardiovascular: RRR, no murmurs appreciated Respiratory: CTAB, good air movement; room air  Abdomen: soft, non-tender, non-distended, +BS Extremities: moving all spontaneously Neuro: A&Ox2, knows her name and date, normal  strength 5/5 upper and lower extremities   Brief Hospital Course:  Patient presented after a reported episode of coughing at her ALF, with subsequently desaturation and concern for aspiration. Upon presentation to ED, she was altered, and was only oriented to person. Patient also had elevated temperature of 100.62F, as well as tachycardia. As such, sepsis protocol was activated, and patient was started on vanc/zosyn. Blood and urine cultures were also drawn. CXR in ED was read as no consolidations or infiltrates by radiology, however upon personal read, there appeared to be possible consolidation in RLL. Patient began receiving IVF, and mental status subsequently improved. She was then admitted for continued antibiotics and further work-up per sepsis protocol.   Patient continued to improve after admission, and AMS had resolved by day #2 of admission. She was transitioned to PO Levaquin on this day for treatment of possible aspiration pneumonia. Repeat CXR showed no evidence of pneumonia or aspiration, however noted some hyperexpansion of lung consistent with pneumonia.   Patient is on chronic prednisone (5 mg qd) for RA and COPD. Dose was increased to stress dose of 40 mg on arrival to be continued for a total of 5 days. Patient stable on room air, only requiring oxygen when patient was ambulating.   Issues for Follow Up:  1. Patient discharged with 2 additional days of Levaquin (last dose 10/17/2015) to complete 5 day course of antibiotics for possible aspiration pneumonia.  2. Prednisone stress dose 40 mg x 2 more days, then 10 mg for two weeks, then return to home dose 5 mg 3. Patient to be discharged on oxygen for ambulation, as patient recovers can consider weaning oxygen  4.  Stop 50,000 units of vitamin D, can continue an maintenance vitamin D 2000 mg  5. Consider outpatient ECHO to revaluation cardiac function   Significant Procedures: None  Significant Labs and Imaging:   Recent Labs Lab  10/13/15 1240 10/14/15 0543  WBC 9.5 9.9  HGB 13.4 10.3*  HCT 41.1 31.1*  PLT 288 243    Recent Labs Lab 10/13/15 1240 10/14/15 0543  NA 138 134*  K 4.1 3.6  CL 98* 103  CO2 26 21*  GLUCOSE 139* 88  BUN 17 13  CREATININE 0.96 0.84  CALCIUM 9.0 7.4*  ALKPHOS 58  --   AST 34  --   ALT 24  --   ALBUMIN 3.8  --     Results/Tests Pending at Time of Discharge: None    Discharge Medications:    Medication List    STOP taking these medications        Vitamin D (Ergocalciferol) 50000 units Caps capsule  Commonly known as:  DRISDOL      TAKE these medications        acetaminophen 325 MG tablet  Commonly known as:  TYLENOL  Take 650 mg by mouth every 4 (four) hours as needed for mild pain or fever. For temperature of 99 or greater. Not to exceed 3000 mg per day. Contact regional provider within 24 hours of fever onset.     aspirin 81 MG chewable tablet  Chew 81 mg by mouth daily.     calcium carbonate 500 MG chewable tablet  Commonly known as:  TUMS - dosed in mg elemental calcium  Chew 1 tablet by mouth daily.     cyanocobalamin 100 MCG tablet  Take 100 mcg by mouth daily.     diclofenac sodium 1 % Gel  Commonly known as:  VOLTAREN  Apply 2 g topically every 6 (six) hours as needed.     folic acid 1 MG tablet  Commonly known as:  FOLVITE  Take 1 mg by mouth daily.     gabapentin 100 MG capsule  Commonly known as:  NEURONTIN  Take 100 mg by mouth 2 (two) times daily.     hydroxychloroquine 200 MG tablet  Commonly known as:  PLAQUENIL  Take 200 mg by mouth daily.     hydroxypropyl methylcellulose / hypromellose 2.5 % ophthalmic solution  Commonly known as:  ISOPTO TEARS / GONIOVISC  Place 2 drops into both eyes 2 (two) times daily.     Lactulose 20 GM/30ML Soln  Take 30 mLs (20 g total) by mouth 2 (two) times daily.     leflunomide 10 MG tablet  Commonly known as:  ARAVA  Take 10 mg by mouth daily.     levofloxacin 500 MG tablet  Commonly known  as:  LEVAQUIN  Take 1 tablet (500 mg total) by mouth daily.     levothyroxine 50 MCG tablet  Commonly known as:  SYNTHROID, LEVOTHROID  Take 50 mcg by mouth daily.     magnesium hydroxide 400 MG/5ML suspension  Commonly known as:  MILK OF MAGNESIA  Take 30 mLs by mouth daily as needed (constipation).     magnesium oxide 400 MG tablet  Commonly known as:  MAG-OX  Take 400 mg by mouth daily.     meclizine 12.5 MG tablet  Commonly known as:  ANTIVERT  Take 12.5 mg by mouth 3 (three) times daily as needed for dizziness (vertigo).     morphine 15 MG 12 hr tablet  Commonly  known as:  MS CONTIN  Take 15 mg by mouth daily.     oxyCODONE 5 MG immediate release tablet  Commonly known as:  Oxy IR/ROXICODONE  Take 5 mg by mouth every 4 (four) hours as needed for severe pain.     pantoprazole 40 MG tablet  Commonly known as:  PROTONIX  Take 40 mg by mouth daily.     predniSONE 10 MG tablet  Commonly known as:  DELTASONE  Take 4 tablets for 2 days, then 1 tablet for 14 days.     promethazine 25 MG tablet  Commonly known as:  PHENERGAN  Take 25 mg by mouth every 4 (four) hours as needed for nausea or vomiting.     senna-docusate 8.6-50 MG tablet  Commonly known as:  Senokot-S  Take 2 tablets by mouth at bedtime as needed (contipation.). May have 1 tablet extra daily as needed     traMADol 50 MG tablet  Commonly known as:  ULTRAM  Take 50 mg by mouth every 6 (six) hours as needed for moderate pain.     Vitamin D3 5000 units Tabs  Take 1 tablet (5,000 Units total) by mouth daily.        Discharge Instructions: Please refer to Patient Instructions section of EMR for full details.  Patient was counseled important signs and symptoms that should prompt return to medical care, changes in medications, dietary instructions, activity restrictions, and follow up appointments.   Follow-Up Appointments: Follow-up Information    Follow up with Willodean Rosenthal, MD. Schedule an appointment as  soon as possible for a visit in 5 days.   Specialty:  Internal Medicine   Why:  Hospital follow-up   Contact information:   [address] [place] [id number] [phone]       Aceton Kinnear [name], MD 10/15/2015, 2:37 PM PGY-1, [place]

## 2015-10-14 NOTE — Progress Notes (Signed)
SLP Cancellation Note  Patient Details Name: AJIAH MILLION MRN: [id number] DOB: 1924-04-10   Cancelled treatment:       Reason Eval/Treat Not Completed: Patient at procedure or test/unavailable  Germain Osgood, M.A. CCC-SLP [phone]  Germain Osgood 10/14/2015, 10:38 AM

## 2015-10-14 NOTE — Progress Notes (Signed)
Patient brought in from NH for hypoxia and change in mental status vs LOC. This morning she could not remember why she was brought in but she stated she is doing well today.   Exam:  Gen: calm in bed, not in distress.  Neuro: Awake and alert, oriented x 3 to place, person and time. She could tell this is the year 2017 and she mentioned she in a [place].  Resp: Air entry equal with faint bibasal crackles.  Heart: S1 S2 normal. No murmur. RRR.  Abd: Soft, NT/ND, BS+ and normal.  Ext: No edema.   A/P;  Hypoxia with change in mental status.  Mental status improved. ?? Syncopal episode which she had in the past multiple times.  She is now more awake and alert.  Fall precaution should be implemented.  Hypoxia may be due to aspiration PNA vs pneumonitis. WBC normal but she had a fever of 100.2 x 1 time.  Swallow eval recommended prior to feeding.  Might be reasonable to switch to oral antibiotic once cleared by speech pathologist.  Consider ECHO to r/o CHF given hx of Afib.   EKG abnormality: RBBB with slightly elevated Troponin.  Patient with no chest pain.  Plan to cycle troponin and repeat EKG.   Review residents notes for chronic problem ( COPD, Shoulder pan, hypothyroidism) Recent event monitor done by her cardiologist was negative.  Consider contacting cards if troponin continues to rise.

## 2015-10-14 NOTE — Evaluation (Signed)
Clinical/Bedside Swallow Evaluation Patient Details  Name: [name] MRN: [id number] Date of Birth: [birth year]  Today's Date: 10/14/2015 Time: SLP Start Time (ACUTE ONLY): 1143 SLP Stop Time (ACUTE ONLY): 1200 SLP Time Calculation (min) (ACUTE ONLY): 17 min  Past Medical History:  Past Medical History  Diagnosis Date  . Mitral valve prolapse   . Hiatal hernia   . Diverticulitis   . Orthostatic hypotension   . Vertigo   . Osteoporosis   . Diverticulosis   . Hypothyroidism   . GERD (gastroesophageal reflux disease)   . Atrial fibrillation ([place])   . COPD (chronic obstructive pulmonary disease) ([place])     "not bothered w/it anymore" (11/20/2012)  . Osteoarthritis   . Rheumatoid arthritis(714.0)   . Chronic lower back pain     "from a ruptured lower disc" (11/20/2012)  . Carcinoma ([place])     "top of left leg, had it taken off; right cheek, there now" (11/20/2012)   Past Surgical History:  Past Surgical History  Procedure Laterality Date  . Back surgery    . Tonsillectomy  ~ 1933  . Fixation kyphoplasty thoracic spine  ~ 2011    "after falling and breaking 2 vertebra" (11/20/2012)  . Tendon repair Right ~ 2009    wrist (11/20/2012)  . Skin cancer excision Left ~ 2007    "top of my lower leg" (11/20/2012)   HPI:  [name] is a 82 y.o. female presenting with AMS, fever, and tachycardia for sepsis work-up. Concern for aspiration during bout of emesis. PMH is significant for COPD, hypothyroidism, RA, hx a.fib   Assessment / Plan / Recommendation Clinical Impression  Pt's oropharyngeal swallow appears WFL and she has no subjective c/o difficulty. No acute findings identified on CXR. Recommend to continue with regular diet and thin liquids - SLP to sign off.    Aspiration Risk  No limitations    Diet Recommendation Regular;Thin liquid   Liquid Administration via: Cup;Straw Medication Administration: Whole meds with liquid Supervision: Patient able to self feed;Intermittent  supervision to cue for compensatory strategies Compensations: Slow rate;Small sips/bites Postural Changes: Seated upright at 90 degrees    Other  Recommendations Oral Care Recommendations: Oral care BID   Follow up Recommendations  None    Frequency and Duration            Prognosis        Swallow Study   General HPI: [name] is a 82 y.o. female presenting with AMS, fever, and tachycardia for sepsis work-up. Concern for aspiration during bout of emesis. PMH is significant for COPD, hypothyroidism, RA, hx a.fib Type of Study: Bedside Swallow Evaluation Previous Swallow Assessment: none in chart Diet Prior to this Study: Regular;Thin liquids Temperature Spikes Noted: Yes (100.2) Respiratory Status: Nasal cannula History of Recent Intubation: No Behavior/Cognition: Alert;Cooperative;Pleasant mood;Other (Comment) (HOH) Oral Cavity Assessment: Within Functional Limits Oral Care Completed by SLP: No Oral Cavity - Dentition: Adequate natural dentition Vision: Functional for self-feeding Self-Feeding Abilities: Able to feed self Patient Positioning: Upright in chair Baseline Vocal Quality: Normal    Oral/Motor/Sensory Function Overall Oral Motor/Sensory Function: Within functional limits   Ice Chips Ice chips: Not tested   Thin Liquid Thin Liquid: Within functional limits Presentation: Cup;Self Fed;Straw    Nectar Thick Nectar Thick Liquid: Not tested   Honey Thick Honey Thick Liquid: Not tested   Puree Puree: Within functional limits Presentation: Self Fed;Spoon   Solid   GO   Solid: Within functional limits Presentation:  Self Fed       Germain Osgood, M.A. CCC-SLP [phone]  Germain Osgood 10/14/2015,12:49 PM

## 2015-10-15 LAB — VITAMIN D 25 HYDROXY (VIT D DEFICIENCY, FRACTURES): VIT D 25 HYDROXY: 41.9 ng/mL (ref 30.0–100.0)

## 2015-10-15 MED ORDER — LEVOFLOXACIN 500 MG PO TABS: 500.0000 mg | ORAL_TABLET | Freq: Every day | ORAL | Status: DC

## 2015-10-15 MED ORDER — VITAMIN D3 125 MCG (5000 UT) PO TABS: 1.0000 | ORAL_TABLET | Freq: Every day | ORAL | Status: DC

## 2015-10-15 MED ORDER — PREDNISONE 10 MG PO TABS: ORAL_TABLET | ORAL | Status: DC

## 2015-10-15 NOTE — Discharge Instructions (Signed)
You were admitted for confusion and possible infection. You will be treated with antibiotics for total 5 days. You also were provided with a stress dose of steroid. You will need to be on oxygen when ambulating until your ability to oxygenate improves.

## 2015-10-15 NOTE — NC FL2 (Signed)
[place] LEVEL OF CARE SCREENING TOOL     IDENTIFICATION  Patient Name: [name] Birthdate: 1924-04-18 Sex: female Admission Date (Current Location): 10/13/2015  [facility] and [place] Number:  Herbalist and Address:  [facility], [place] [address], [place], [place] [address]      Provider Number: [id number]  Attending Physician Name and Address:  Lupita Dawn, MD  Relative Name and Phone Number:  [name] - daughter. Phone number [phone].    Current Level of Care: Hospital Recommended Level of Care: [place] ([facility]) Prior Approval Number:    Date Approved/Denied:   PASRR Number:    Discharge Plan: Other (Comment) (Assisted Living)    Current Diagnoses: Patient Active Problem List   Diagnosis Date Noted  . Aspiration into airway   . Pneumonia 10/13/2015  . Aspiration pneumonia ([place]) 10/13/2015  . Sepsis ([place])   . Encephalopathy   . Thyroid activity decreased   . Long term current use of systemic steroids   . Chronic obstructive pulmonary disease ([place])   . Syncope and collapse 11/20/2012  . Hypotension 11/20/2012  . Nausea and vomiting 11/20/2012  . Urinary retention 11/20/2012  . Dysuria R/O UTI 11/20/2012  . Anemia 11/20/2012  . GERD (gastroesophageal reflux disease) 11/20/2012  . Dehydration 01/06/2012  . Gastroenteritis 01/06/2012  . Gait instability 01/06/2012  . Arthritis   . Mitral valve prolapse   . Hiatal hernia   . Diverticulitis   . COPD (chronic obstructive pulmonary disease) ([place])   . Thyroid disease   . Atrial fibrillation ([place])   . Orthostatic hypotension   . Vertigo   . Osteoporosis   . Diverticulosis   . Syncope 08/12/2011  . CARCINOMA, BASAL CELL 06/11/2010  . Rheumatoid arthritis(714.0) 05/22/2009  . Unspecified hypothyroidism 05/20/2009  . ABDOMINAL PAIN 05/17/2009    Orientation RESPIRATION BLADDER Height & Weight     Self, Time,  Situation, Place  Normal Continent Weight: 117 lb 15.1 oz (53.5 kg) Height:  5\' 4"  (162.6 cm)  BEHAVIORAL SYMPTOMS/MOOD NEUROLOGICAL BOWEL NUTRITION STATUS      Continent Diet (Low sodium - Heart Healthy)  AMBULATORY STATUS COMMUNICATION OF NEEDS Skin   Limited Assist Verbally Normal                       Personal Care Assistance Level of Assistance  Bathing, Feeding, Dressing Bathing Assistance: Limited assistance Feeding assistance: Independent Dressing Assistance: Limited assistance     Functional Limitations Info  Sight, Hearing, Speech Sight Info: Adequate Hearing Info: Adequate Speech Info: Adequate    SPECIAL CARE FACTORS FREQUENCY  PT (By licensed PT), OT (By licensed OT)     PT Frequency: Evaluated 2/7 - minimum of 2x per week [place] therapy recommended OT Frequency: Evaluated 2/8 - minimum of 3x per week HH therapy recommended            Contractures Contractures Info: Not present    Additional Factors Info  Code Status, Allergies Code Status Info: DNR Allergies Info: Latose intolerance           Current Medications (10/15/2015):  This is the current hospital active medication list Current Facility-Administered Medications  Medication Dose Route Frequency Provider Last Rate Last Dose  . 0.9 %  sodium chloride infusion   Intravenous Continuous  N Rumley, DO   Stopped at 10/14/15 0700  . acetaminophen (TYLENOL) tablet 650 mg  650 mg  Oral Q4H PRN [employer], DO      . antiseptic oral rinse (CPC / CETYLPYRIDINIUM CHLORIDE 0.05%) solution 7 mL  7 mL Mouth Rinse BID Lupita Dawn, MD   7 mL at 10/14/15 1000  . aspirin chewable tablet 81 mg  81 mg Oral Daily [employer], DO   81 mg at 10/15/15 1107  . gabapentin (NEURONTIN) capsule 100 mg  100 mg Oral BID [facility], DO   100 mg at 10/15/15 1108  . heparin injection 5,000 Units  5,000 Units Subcutaneous 3 times per day Lorna Few, DO   5,000 Units at 10/15/15 1359  .  hydroxychloroquine (PLAQUENIL) tablet 200 mg  200 mg Oral Daily [place] N Rumley, DO   200 mg at 10/15/15 1108  . levofloxacin (LEVAQUIN) tablet 500 mg  500 mg Oral Daily [place] [name], MD   500 mg at 10/15/15 1108  . levothyroxine (SYNTHROID, LEVOTHROID) tablet 50 mcg  50 mcg Oral QAC breakfast [place] N Rumley, DO   50 mcg at 10/15/15 1108  . morphine (MS CONTIN) 12 hr tablet 15 mg  15 mg Oral Daily [place] N Rumley, DO   15 mg at 10/15/15 1107  . ondansetron (ZOFRAN-ODT) disintegrating tablet 4 mg  4 mg Oral Q8H PRN [place] N Rumley, DO      . pantoprazole (PROTONIX) EC tablet 40 mg  40 mg Oral Daily [place] N Rumley, DO   40 mg at 10/15/15 1108  . predniSONE (DELTASONE) tablet 40 mg  40 mg Oral Q breakfast [place] N Rumley, DO   40 mg at 10/15/15 1108  . senna-docusate (Senokot-S) tablet 2 tablet  2 tablet Oral QHS PRN [place] N Rumley, DO      . sodium chloride flush (NS) 0.9 % injection 3 mL  3 mL Intravenous Q12H [place] N Rumley, DO   3 mL at 10/13/15 2051     Discharge Medications: Please see discharge summary for a list of discharge medications.  Relevant Imaging Results:  Relevant Lab Results:   Additional Information Stop taking theae meds: Vitamin D (Ergocalciferol) 50000 units Caps capsule, Take these medications: acetaminophen 325 MG tablet; aspirin 81 MG chewable tablet; calcium carbonate 500 MG chewable tablet; cyanocobalamin 100 MCG tablet; diclofenac sodium 1 % Gel; folic acid 1 MG tablet; gabapentin 100 MG capsule; hydroxychloroquine 200 MG tablet;  hydroxypropyl methylcellulose / hypromellose 2.5 % ophthalmic solution; Lactulose 20 GM/30ML Soln; leflunomide 10 MG tablet; levofloxacin 500 MG tablet; levothyroxine 50 MCG tablet; magnesium hydroxide 400 MG/5ML suspension; magnesium oxide 400 MG tablet; meclizine 12.5 MG tablet; morphine 15 MG 12 hr tablet; oxyCODONE 5 MG immediate release tablet; pantoprazole 40 MG tablet; predniSONE 10 MG tablet; promethazine 25 MG  tablet; senna-docusate 8.6-50 MG tablet; traMADol 50 MG tablet; Vitamin D3 5000 units Tabs.             Sable Feil, LCSW

## 2015-10-15 NOTE — Clinical Social Work Note (Signed)
Patient discharged back to [facility] today, transported by ambulance. Daughter [name] aware of discharge and at the bedside when transport arrived. Discharge information transmitted to facility.  Aune Adami Givens, MSW, LCSW Licensed Clinical Social Worker [place] [phone]

## 2015-10-15 NOTE — Care Management Note (Signed)
Case Management Note  Patient Details  Name: [name] MRN: [id number] Date of Birth: 04-07-1924  Subjective/Objective:       CM following for progression and d/c planning.              Action/Plan: 10/15/2015 Pt for d/c back to assisted living level of care at [facility]. This CM spoke with staff at that facility and the preference for home oxygen is [facility]. Await orders for HHPT and HHOT as well as final decision re home oxygen.  Expected Discharge Date:      10/15/2015            Expected Discharge Plan:  Assisted Living / Rest Home  In-House Referral:  Clinical Social Work  Discharge planning Services  NA  Post Acute Care Choice:  Durable Medical Equipment Choice offered to:  Patient  DME Arranged:  Oxygen DME Agency:  [place]:  PT, OT [facility] Agency:  Other - See comment  Status of Service:  Completed, signed off  Medicare Important Message Given:    Date Medicare IM Given:    Medicare IM give by:    Date Additional Medicare IM Given:    Additional Medicare Important Message give by:     If discussed at [place] of Stay Meetings, dates discussed:    Additional Comments:  Adron Bene, RN 10/15/2015, 12:49 PM

## 2015-10-15 NOTE — Progress Notes (Signed)
SATURATION QUALIFICATIONS: (This note is used to comply with regulatory documentation for home oxygen)  Patient Saturations on Room Air at Rest = 98%  Patient Saturations on Room Air while Ambulating = 85-88%  Patient Saturations on 2 Liters of oxygen while Ambulating = 95-100%

## 2015-10-15 NOTE — Care Management Note (Signed)
Case Management Note  Patient Details  Name: [name] MRN: [id number] Date of Birth: 02-Feb-1924  Subjective/Objective:       CM following for progression and d/c planning.             Action/Plan: 10/15/2015 Following for progression, per report the plan is to return to [facility], where the pt is a resident of assisted living. Pt may possibly need home oxygen at the time of d/c , awaiting final orders based on qualifying sats. If pt qualifies and oxygen is ordered this CM will contact [facility] re preferred provider.   Expected Discharge Date:                  Expected Discharge Plan:  Assisted Living / Rest Home  In-House Referral:  Clinical Social Work  Discharge planning Services  NA  Post Acute Care Choice:  Durable Medical Equipment Choice offered to:  Patient  DME Arranged:  Oxygen DME Agency:     HH Arranged:    [place] Agency:     Status of Service:  In process, will continue to follow  Medicare Important Message Given:    Date Medicare IM Given:    Medicare IM give by:    Date Additional Medicare IM Given:    Additional Medicare Important Message give by:     If discussed at [place] of Stay Meetings, dates discussed:    Additional Comments:  Adron Bene, RN 10/15/2015, 10:46 AM

## 2015-10-15 NOTE — Progress Notes (Signed)
Occupational Therapy Evaluation Patient Details Name: [name] MRN: [id number] DOB: 1924/05/06 Today's Date: 10/15/2015    History of Present Illness [name] is a 82 y.o. female presenting with AMS, fever, and tachycardia for sepsis work-up. PMH is significant for COPD, hypothyroidism, RA, hx a.fib   Clinical Impression   Patient presents to OT with decreased ADL independence and safety due to the deficits listed below. She is overall functioning at min A level for ADLs, and was previously modified independent. She will benefit from skilled OT to maximize function and to facilitate a safe discharge. OT will follow.    Follow Up Recommendations  Home health OT;Supervision/Assistance - 24 hour    Equipment Recommendations  None recommended by OT    Recommendations for Other Services       Precautions / Restrictions Precautions Precautions: Fall Restrictions Weight Bearing Restrictions: No      Mobility Bed Mobility Overal bed mobility: Needs Assistance Bed Mobility: Supine to Sit     Supine to sit: Min assist     General bed mobility comments: use of bed rails; pt had difficulty scooting to EOB  Transfers Overall transfer level: Needs assistance Equipment used: Rolling walker (2 wheeled) Transfers: Sit to/from Stand Sit to Stand: Min guard              Balance                                            ADL Overall ADL's : Needs assistance/impaired Eating/Feeding: Set up;Bed level   Grooming: Wash/dry hands;Wash/dry face;Oral care;Brushing hair;Minimal assistance;Standing                   Toilet Transfer: Min guard;Stand-pivot;BSC;RW   Toileting- Water quality scientist and Hygiene: Min guard;Sit to/from stand       Functional mobility during ADLs: Min guard;Rolling walker General ADL Comments: Patient agreeable to OT; participated in bed mobility, ambulation with RW to sink, grooming in standing at sink, transfer  to [facility] and toileting, and back to bed.     Vision     Perception     Praxis      Pertinent Vitals/Pain Pain Assessment: 0-10 Pain Score: 5  Pain Location: stomach pain Pain Descriptors / Indicators: Sore Pain Intervention(s): Limited activity within patient's tolerance;Monitored during session;Repositioned     Hand Dominance Right   Extremity/Trunk Assessment Upper Extremity Assessment Upper Extremity Assessment: Generalized weakness   Lower Extremity Assessment Lower Extremity Assessment: Defer to PT evaluation   Cervical / Trunk Assessment Cervical / Trunk Assessment: Kyphotic   Communication Communication Communication: HOH   Cognition Arousal/Alertness: Awake/alert Behavior During Therapy: WFL for tasks assessed/performed Overall Cognitive Status: Within Functional Limits for tasks assessed                     General Comments       Exercises       Shoulder Instructions      Home Living Family/patient expects to be discharged to:: Assisted living                             Home Equipment: Walker - 4 wheels;Grab bars - toilet          Prior Functioning/Environment Level of Independence: Needs assistance  Gait / Transfers Assistance Needed: independent with  Rollator ADL's / Homemaking Assistance Needed: ALF provides all meals (dining hall), administers medication, does all cleaning and laundry   Comments: sponge bathes only    OT Diagnosis: Generalized weakness   OT Problem List: Decreased strength;Decreased activity tolerance;Impaired balance (sitting and/or standing);Decreased safety awareness;Cardiopulmonary status limiting activity;Pain   OT Treatment/Interventions: Self-care/ADL training;Energy conservation;DME and/or AE instruction;Therapeutic activities;Patient/family education    OT Goals(Current goals can be found in the care plan section) Acute Rehab OT Goals Patient Stated Goal: none stated OT Goal Formulation:  With patient Time For Goal Achievement: 10/29/15 Potential to Achieve Goals: Good  OT Frequency: Min 2X/week   Barriers to D/C:            Co-evaluation              End of Session Equipment Utilized During Treatment: Rolling walker  Activity Tolerance: Patient tolerated treatment well Patient left: in bed;with call bell/phone within reach   Time: 0945-1010 OT Time Calculation (min): 25 min Charges:  OT General Charges $OT Visit: 1 Procedure OT Evaluation $OT Eval Moderate Complexity: 1 Procedure OT Treatments $Self Care/Home Management : 8-22 mins G-Codes:    Stasha Naraine A 10-18-15, 10:53 AM

## 2015-10-18 LAB — CULTURE, BLOOD (ROUTINE X 2)
Culture: NO GROWTH
Culture: NO GROWTH

## 2016-06-15 ENCOUNTER — Inpatient Hospital Stay (HOSPITAL_COMMUNITY)
Admission: EM | Admit: 2016-06-15 | Discharge: 2016-06-20 | DRG: 641 | Disposition: A | Discharge status: Home Health | Payer: Medicare Other | Attending: Internal Medicine | Admitting: Internal Medicine

## 2016-06-15 ENCOUNTER — Encounter (HOSPITAL_COMMUNITY): Payer: Self-pay | Admitting: Emergency Medicine

## 2016-06-15 ENCOUNTER — Emergency Department (HOSPITAL_COMMUNITY): Payer: Medicare Other

## 2016-06-15 DIAGNOSIS — R531 Weakness: Secondary | ICD-10-CM | POA: Diagnosis not present

## 2016-06-15 DIAGNOSIS — Z8052 Family history of malignant neoplasm of bladder: Secondary | ICD-10-CM

## 2016-06-15 DIAGNOSIS — Z91011 Allergy to milk products: Secondary | ICD-10-CM

## 2016-06-15 DIAGNOSIS — B029 Zoster without complications: Secondary | ICD-10-CM | POA: Diagnosis present

## 2016-06-15 DIAGNOSIS — B028 Zoster with other complications: Secondary | ICD-10-CM | POA: Diagnosis not present

## 2016-06-15 DIAGNOSIS — B0230 Zoster ocular disease, unspecified: Secondary | ICD-10-CM

## 2016-06-15 DIAGNOSIS — R262 Difficulty in walking, not elsewhere classified: Secondary | ICD-10-CM

## 2016-06-15 DIAGNOSIS — J449 Chronic obstructive pulmonary disease, unspecified: Secondary | ICD-10-CM | POA: Diagnosis present

## 2016-06-15 DIAGNOSIS — Z66 Do not resuscitate: Secondary | ICD-10-CM | POA: Diagnosis present

## 2016-06-15 DIAGNOSIS — R2681 Unsteadiness on feet: Secondary | ICD-10-CM | POA: Diagnosis present

## 2016-06-15 DIAGNOSIS — I951 Orthostatic hypotension: Secondary | ICD-10-CM | POA: Diagnosis present

## 2016-06-15 DIAGNOSIS — E860 Dehydration: Secondary | ICD-10-CM | POA: Diagnosis present

## 2016-06-15 DIAGNOSIS — Z7982 Long term (current) use of aspirin: Secondary | ICD-10-CM

## 2016-06-15 DIAGNOSIS — M069 Rheumatoid arthritis, unspecified: Secondary | ICD-10-CM

## 2016-06-15 DIAGNOSIS — Z823 Family history of stroke: Secondary | ICD-10-CM

## 2016-06-15 DIAGNOSIS — I4891 Unspecified atrial fibrillation: Secondary | ICD-10-CM | POA: Diagnosis not present

## 2016-06-15 DIAGNOSIS — K219 Gastro-esophageal reflux disease without esophagitis: Secondary | ICD-10-CM | POA: Diagnosis present

## 2016-06-15 DIAGNOSIS — Z87891 Personal history of nicotine dependence: Secondary | ICD-10-CM

## 2016-06-15 DIAGNOSIS — Z85828 Personal history of other malignant neoplasm of skin: Secondary | ICD-10-CM

## 2016-06-15 DIAGNOSIS — E871 Hypo-osmolality and hyponatremia: Secondary | ICD-10-CM | POA: Diagnosis present

## 2016-06-15 DIAGNOSIS — I10 Essential (primary) hypertension: Secondary | ICD-10-CM | POA: Diagnosis present

## 2016-06-15 DIAGNOSIS — M6281 Muscle weakness (generalized): Secondary | ICD-10-CM | POA: Diagnosis present

## 2016-06-15 DIAGNOSIS — E039 Hypothyroidism, unspecified: Secondary | ICD-10-CM | POA: Diagnosis present

## 2016-06-15 HISTORY — DX: Zoster without complications: B02.9

## 2016-06-15 LAB — COMPREHENSIVE METABOLIC PANEL
ALBUMIN: 4.2 g/dL (ref 3.5–5.0)
ALT: 20 U/L (ref 14–54)
AST: 30 U/L (ref 15–41)
Alkaline Phosphatase: 64 U/L (ref 38–126)
Anion gap: 11 (ref 5–15)
BUN: 21 mg/dL — ABNORMAL HIGH (ref 6–20)
CHLORIDE: 97 mmol/L — AB (ref 101–111)
CO2: 25 mmol/L (ref 22–32)
Calcium: 9.3 mg/dL (ref 8.9–10.3)
Creatinine, Ser: 0.99 mg/dL (ref 0.44–1.00)
GFR, EST AFRICAN AMERICAN: 57 mL/min — AB (ref >60)
GFR, EST NON AFRICAN AMERICAN: 49 mL/min — AB (ref >60)
Glucose, Bld: 90 mg/dL (ref 65–99)
POTASSIUM: 4.3 mmol/L (ref 3.5–5.1)
Sodium: 133 mmol/L — ABNORMAL LOW (ref 135–145)
Total Bilirubin: 0.3 mg/dL (ref 0.3–1.2)
Total Protein: 7.2 g/dL (ref 6.5–8.1)

## 2016-06-15 LAB — CBC WITH DIFFERENTIAL/PLATELET
Basophils Absolute: 0.1 10*3/uL (ref 0.0–0.1)
Basophils Relative: 1 %
EOS ABS: 0 10*3/uL (ref 0.0–0.7)
Eosinophils Relative: 0 %
HEMATOCRIT: 39.1 % (ref 36.0–46.0)
HEMOGLOBIN: 13.3 g/dL (ref 12.0–15.0)
LYMPHS ABS: 1.7 10*3/uL (ref 0.7–4.0)
LYMPHS PCT: 17 %
MCH: 32.8 pg (ref 26.0–34.0)
MCHC: 34 g/dL (ref 30.0–36.0)
MCV: 96.5 fL (ref 78.0–100.0)
MONOS PCT: 14 %
Monocytes Absolute: 1.4 10*3/uL — ABNORMAL HIGH (ref 0.1–1.0)
NEUTROS ABS: 7 10*3/uL (ref 1.7–7.7)
NEUTROS PCT: 68 %
Platelets: 326 10*3/uL (ref 150–400)
RBC: 4.05 MIL/uL (ref 3.87–5.11)
RDW: 13 % (ref 11.5–15.5)
WBC: 10.2 10*3/uL (ref 4.0–10.5)

## 2016-06-15 MED ORDER — BISACODYL 10 MG RE SUPP
10.0000 mg | RECTAL | Status: DC | PRN
  Administered 2016-06-18: 10 mg via RECTAL
  Filled 2016-06-15: qty 1

## 2016-06-15 MED ORDER — MAGNESIUM HYDROXIDE 400 MG/5ML PO SUSP: 30.0000 mL | Freq: Every day | ORAL | Status: DC | PRN

## 2016-06-15 MED ORDER — FOLIC ACID 1 MG PO TABS
1.0000 mg | ORAL_TABLET | Freq: Every day | ORAL | Status: DC
  Administered 2016-06-16 – 2016-06-20 (×5): 1 mg via ORAL
  Filled 2016-06-15 (×5): qty 1

## 2016-06-15 MED ORDER — LEVOTHYROXINE SODIUM 50 MCG PO TABS
50.0000 ug | ORAL_TABLET | Freq: Every day | ORAL | Status: DC
  Administered 2016-06-16 – 2016-06-20 (×5): 50 ug via ORAL
  Filled 2016-06-15 (×5): qty 1

## 2016-06-15 MED ORDER — MAGNESIUM GLUCONATE 500 MG PO TABS
500.0000 mg | ORAL_TABLET | Freq: Two times a day (BID) | ORAL | Status: DC
  Administered 2016-06-15 – 2016-06-20 (×10): 500 mg via ORAL
  Filled 2016-06-15 (×11): qty 1

## 2016-06-15 MED ORDER — ACETAMINOPHEN 325 MG PO TABS
650.0000 mg | ORAL_TABLET | ORAL | Status: DC | PRN
  Administered 2016-06-17: 650 mg via ORAL
  Filled 2016-06-15: qty 2

## 2016-06-15 MED ORDER — PREDNISONE 5 MG PO TABS
5.0000 mg | ORAL_TABLET | Freq: Every day | ORAL | Status: DC
  Administered 2016-06-16 – 2016-06-20 (×5): 5 mg via ORAL
  Filled 2016-06-15 (×5): qty 1

## 2016-06-15 MED ORDER — VALACYCLOVIR HCL 500 MG PO TABS
500.0000 mg | ORAL_TABLET | Freq: Three times a day (TID) | ORAL | Status: DC
  Administered 2016-06-15 – 2016-06-18 (×8): 500 mg via ORAL
  Filled 2016-06-15 (×9): qty 1

## 2016-06-15 MED ORDER — POLYETHYLENE GLYCOL 3350 17 G PO PACK: 17.0000 g | PACK | Freq: Every day | ORAL | Status: DC | PRN

## 2016-06-15 MED ORDER — NON FORMULARY: Status: DC

## 2016-06-15 MED ORDER — ASPIRIN 81 MG PO CHEW
81.0000 mg | CHEWABLE_TABLET | Freq: Every day | ORAL | Status: DC
Start: 2016-06-16 — End: 2016-06-20
  Administered 2016-06-16 – 2016-06-20 (×5): 81 mg via ORAL
  Filled 2016-06-15 (×5): qty 1

## 2016-06-15 MED ORDER — OXYCODONE HCL 5 MG PO TABS
5.0000 mg | ORAL_TABLET | ORAL | Status: DC | PRN
  Administered 2016-06-16 – 2016-06-17 (×3): 5 mg via ORAL
  Filled 2016-06-15 (×3): qty 1

## 2016-06-15 MED ORDER — SODIUM CHLORIDE 0.9 % IV SOLN
INTRAVENOUS | Status: AC
  Administered 2016-06-15: via INTRAVENOUS

## 2016-06-15 MED ORDER — SODIUM CHLORIDE 0.9 % IV BOLUS (SEPSIS)
500.0000 mL | Freq: Once | INTRAVENOUS | Status: AC
  Administered 2016-06-15: 500 mL via INTRAVENOUS

## 2016-06-15 MED ORDER — VITAMIN D3 25 MCG (1000 UNIT) PO TABS
2000.0000 [IU] | ORAL_TABLET | Freq: Every day | ORAL | Status: DC
  Administered 2016-06-16 – 2016-06-20 (×5): 2000 [IU] via ORAL
  Filled 2016-06-15 (×5): qty 2

## 2016-06-15 MED ORDER — ACETAMINOPHEN 500 MG PO TABS
1000.0000 mg | ORAL_TABLET | Freq: Every evening | ORAL | Status: DC
  Administered 2016-06-15 – 2016-06-19 (×5): 1000 mg via ORAL
  Filled 2016-06-15 (×5): qty 2

## 2016-06-15 MED ORDER — NON FORMULARY
Status: DC
Start: 2016-06-15 — End: 2016-06-15

## 2016-06-15 MED ORDER — ERYTHROMYCIN 5 MG/GM OP OINT
1.0000 | TOPICAL_OINTMENT | Freq: Two times a day (BID) | OPHTHALMIC | Status: DC
Start: 2016-06-15 — End: 2016-06-20
  Administered 2016-06-15 – 2016-06-20 (×10): 1 via OPHTHALMIC
  Filled 2016-06-15: qty 3.5

## 2016-06-15 MED ORDER — PROMETHAZINE HCL 25 MG PO TABS: 25.0000 mg | ORAL_TABLET | ORAL | Status: DC | PRN

## 2016-06-15 MED ORDER — LOPERAMIDE HCL 2 MG PO CAPS: 2.0000 mg | ORAL_CAPSULE | ORAL | Status: DC | PRN

## 2016-06-15 MED ORDER — HYDROXYCHLOROQUINE SULFATE 200 MG PO TABS
200.0000 mg | ORAL_TABLET | Freq: Every day | ORAL | Status: DC
  Administered 2016-06-15 – 2016-06-19 (×5): 200 mg via ORAL
  Filled 2016-06-15 (×6): qty 1

## 2016-06-15 MED ORDER — MAGIC MOUTHWASH
10.0000 mL | Freq: Two times a day (BID) | ORAL | Status: DC | PRN
  Filled 2016-06-15: qty 10

## 2016-06-15 MED ORDER — SENNOSIDES-DOCUSATE SODIUM 8.6-50 MG PO TABS
2.0000 | ORAL_TABLET | Freq: Every day | ORAL | Status: DC
  Administered 2016-06-15 – 2016-06-19 (×5): 2 via ORAL
  Filled 2016-06-15 (×5): qty 2

## 2016-06-15 MED ORDER — MORPHINE SULFATE ER 15 MG PO TBCR
15.0000 mg | EXTENDED_RELEASE_TABLET | Freq: Every day | ORAL | Status: DC
  Administered 2016-06-16 – 2016-06-20 (×5): 15 mg via ORAL
  Filled 2016-06-15 (×5): qty 1

## 2016-06-15 MED ORDER — MECLIZINE HCL 12.5 MG PO TABS
12.5000 mg | ORAL_TABLET | Freq: Three times a day (TID) | ORAL | Status: DC | PRN
Start: 2016-06-15 — End: 2016-06-20
  Filled 2016-06-15: qty 1

## 2016-06-15 MED ORDER — PANTOPRAZOLE SODIUM 40 MG PO TBEC
40.0000 mg | DELAYED_RELEASE_TABLET | Freq: Every day | ORAL | Status: DC
  Administered 2016-06-16 – 2016-06-20 (×5): 40 mg via ORAL
  Filled 2016-06-15 (×5): qty 1

## 2016-06-15 MED ORDER — DICLOFENAC SODIUM 1 % TD GEL: 2.0000 g | Freq: Four times a day (QID) | TRANSDERMAL | Status: DC | PRN

## 2016-06-15 MED ORDER — POLYVINYL ALCOHOL 1.4 % OP SOLN
1.0000 [drp] | OPHTHALMIC | Status: DC
  Administered 2016-06-16 – 2016-06-20 (×48): 1 [drp] via OPHTHALMIC
  Filled 2016-06-15: qty 15

## 2016-06-15 MED ORDER — GABAPENTIN 100 MG PO CAPS
100.0000 mg | ORAL_CAPSULE | Freq: Two times a day (BID) | ORAL | Status: DC
  Administered 2016-06-15 – 2016-06-20 (×10): 100 mg via ORAL
  Filled 2016-06-15 (×9): qty 1

## 2016-06-15 MED ORDER — ONDANSETRON HCL 4 MG PO TABS: 4.0000 mg | ORAL_TABLET | Freq: Four times a day (QID) | ORAL | Status: DC | PRN

## 2016-06-15 MED ORDER — CALCIUM CARBONATE ANTACID 500 MG PO CHEW
1.0000 | CHEWABLE_TABLET | Freq: Every day | ORAL | Status: DC
  Administered 2016-06-16 – 2016-06-20 (×5): 200 mg via ORAL
  Filled 2016-06-15 (×5): qty 1

## 2016-06-15 MED ORDER — ONDANSETRON HCL 4 MG/2ML IJ SOLN
4.0000 mg | Freq: Four times a day (QID) | INTRAMUSCULAR | Status: DC | PRN
  Administered 2016-06-17: 4 mg via INTRAVENOUS
  Filled 2016-06-15: qty 2

## 2016-06-15 MED ORDER — VITAMIN B-12 100 MCG PO TABS
100.0000 ug | ORAL_TABLET | Freq: Every day | ORAL | Status: DC
  Administered 2016-06-16 – 2016-06-20 (×5): 100 ug via ORAL
  Filled 2016-06-15 (×5): qty 1

## 2016-06-15 MED ORDER — TRAMADOL HCL 50 MG PO TABS: 50.0000 mg | ORAL_TABLET | Freq: Four times a day (QID) | ORAL | Status: DC | PRN

## 2016-06-15 NOTE — ED Triage Notes (Signed)
Per EMS-states she was diagnosed with shingles yesterday-took first dose of Valtrex at 10 am-staff states she became very weak, unable to ambulate-states weakness and acute pain with shingles

## 2016-06-15 NOTE — Progress Notes (Signed)
Pt with forms that came with her from [place] landing at [facility] PCP listed as Willodean Rosenthal

## 2016-06-15 NOTE — ED Notes (Signed)
Bed: [id number] Expected date:  Expected time:  Means of arrival:  Comments: EMS-shingles

## 2016-06-15 NOTE — ED Notes (Signed)
Attempted to ambulate pt with Cindie Crumbly, NT. Unsuccessful. MD notified.

## 2016-06-15 NOTE — H&P (Signed)
History and Physical    [name] [id number] DOB: [birth year] DOA: 06/15/2016  PCP: Willodean Rosenthal, MD  Patient coming from: assisted living  Chief Complaint:  Unable to walk  HPI: [name] is a 82 y.o. female with medical history significant of CHF, CKD recent shingles on her face started on valtrex and was seen by an "eye doctor" yesterday.  Pt says she was told that the shingles was in her eye (left) and was given some eye drops.  The patient today was weak and could not get out of bed with her walker like she normally does.  She denies any fevers.  She has had the rash to her face left side about a week.  It is painful.  No n/v/d.  She cannot tell me if she is having pain anywhere else she says "i dont know".  Pt lives in assisted living and was sent to ED due to her weakness. Pt says she has not had much of an appetite due to the pain to her face.  Pt referred for admission for her inability to walk.   Review of Systems: As per HPI otherwise 10 point review of systems negative.   Past Medical History:  Diagnosis Date  . Atrial fibrillation ([place])   . Carcinoma ([place])    "top of left leg, had it taken off; right cheek, there now" (11/20/2012)  . Chronic lower back pain    "from a ruptured lower disc" (11/20/2012)  . COPD (chronic obstructive pulmonary disease) ([place])    "not bothered w/it anymore" (11/20/2012)  . Diverticulitis   . Diverticulosis   . GERD (gastroesophageal reflux disease)   . Hiatal hernia   . Hypothyroidism   . Mitral valve prolapse   . Orthostatic hypotension   . Osteoarthritis   . Osteoporosis   . Rheumatoid arthritis(714.0)   . Shingles   . Vertigo     Past Surgical History:  Procedure Laterality Date  . BACK SURGERY    . FIXATION KYPHOPLASTY THORACIC SPINE  ~ 2011   "after falling and breaking 2 vertebra" (11/20/2012)  . SKIN CANCER EXCISION Left ~ 2007   "top of my lower leg" (11/20/2012)  . TENDON REPAIR Right ~ 2009   wrist (11/20/2012)    . TONSILLECTOMY  ~ 1933     reports that she quit smoking about 30 years ago. Her smoking use included Cigarettes. She has a 4.80 pack-year smoking history. She has never used smokeless tobacco. She reports that she does not drink alcohol or use drugs.  Allergies  Allergen Reactions  . Lactose Intolerance (Gi) Other (See Comments)    Reaction:  GI upset     Family History  Problem Relation Age of Onset  . Cancer Mother     bladder  . Hyperlipidemia Sister   . Stroke Maternal Aunt     Prior to Admission medications   Medication Sig Start Date End Date Taking? Authorizing Provider  acetaminophen (TYLENOL) 325 MG tablet Take 650 mg by mouth every 4 (four) hours as needed for mild pain, moderate pain, fever or headache.    Yes Historical Provider, MD  acetaminophen (TYLENOL) 500 MG tablet Take 1,000 mg by mouth every evening.   Yes Historical Provider, MD  aspirin 81 MG chewable tablet Chew 81 mg by mouth daily.   Yes Historical Provider, MD  bisacodyl (DULCOLAX) 10 MG suppository Place 10 mg rectally as needed for moderate constipation.   Yes Historical Provider, MD  calcium carbonate (  TUMS - DOSED IN MG ELEMENTAL CALCIUM) 500 MG chewable tablet Chew 1 tablet by mouth daily.   Yes Historical Provider, MD  cholecalciferol (VITAMIN D) 1000 units tablet Take 2,000 Units by mouth daily.   Yes Historical Provider, MD  cyanocobalamin 100 MCG tablet Take 100 mcg by mouth daily.   Yes Historical Provider, MD  diclofenac sodium (VOLTAREN) 1 % GEL Apply 2 g topically every 6 (six) hours as needed (for pain).    Yes Historical Provider, MD  erythromycin ophthalmic ointment Place 1 application into the left eye 2 (two) times daily. 06/15/16 06/22/16 Yes Historical Provider, MD  folic acid (FOLVITE) 1 MG tablet Take 1 mg by mouth daily.   Yes Historical Provider, MD  gabapentin (NEURONTIN) 100 MG capsule Take 100 mg by mouth 2 (two) times daily.   Yes Historical Provider, MD  hydroxychloroquine  (PLAQUENIL) 200 MG tablet Take 200 mg by mouth at bedtime.    Yes Historical Provider, MD  hydroxypropyl methylcellulose (ISOPTO TEARS) 2.5 % ophthalmic solution Place 1 drop into the left eye every 2 (two) hours.    Yes Historical Provider, MD  levothyroxine (SYNTHROID, LEVOTHROID) 50 MCG tablet Take 50 mcg by mouth daily before breakfast.    Yes Historical Provider, MD  loperamide (IMODIUM) 2 MG capsule Take 2 mg by mouth as needed for diarrhea or loose stools.   Yes Historical Provider, MD  magic mouthwash SOLN Take 10 mLs by mouth 2 (two) times daily as needed for mouth pain. Pt is to swish and spit.   Yes Historical Provider, MD  magnesium gluconate (MAGONATE) 500 MG tablet Take 500 mg by mouth 2 (two) times daily.   Yes Historical Provider, MD  magnesium hydroxide (MILK OF MAGNESIA) 400 MG/5ML suspension Take 30 mLs by mouth daily as needed for mild constipation or moderate constipation.    Yes Historical Provider, MD  meclizine (ANTIVERT) 12.5 MG tablet Take 12.5 mg by mouth 3 (three) times daily as needed for dizziness.    Yes Historical Provider, MD  morphine (MS CONTIN) 15 MG 12 hr tablet Take 15 mg by mouth daily.   Yes Historical Provider, MD  oxyCODONE (OXY IR/ROXICODONE) 5 MG immediate release tablet Take 5 mg by mouth every 4 (four) hours as needed for severe pain.   Yes Historical Provider, MD  pantoprazole (PROTONIX) 40 MG tablet Take 40 mg by mouth daily before breakfast.    Yes Historical Provider, MD  polyethylene glycol (MIRALAX / GLYCOLAX) packet Take 17 g by mouth daily as needed for mild constipation or moderate constipation.   Yes Historical Provider, MD  predniSONE (DELTASONE) 5 MG tablet Take 5 mg by mouth daily with breakfast.   Yes Historical Provider, MD  promethazine (PHENERGAN) 25 MG tablet Take 25 mg by mouth every 4 (four) hours as needed for nausea or vomiting.   Yes Historical Provider, MD  senna-docusate (SENOKOT-S) 8.6-50 MG per tablet Take 2 tablets by mouth at  bedtime. Pt is able to take an additional tablet during the day if needed.   Yes Historical Provider, MD  traMADol (ULTRAM) 50 MG tablet Take 50 mg by mouth every 6 (six) hours as needed for moderate pain.   Yes Historical Provider, MD  valACYclovir (VALTREX) 500 MG tablet Take 500 mg by mouth every 8 (eight) hours. 06/15/16 06/22/16 Yes Historical Provider, MD    Physical Exam: Vitals:   06/15/16 1453 06/15/16 1750  BP: (!) 119/46 148/70  Pulse: (!) 56 102  Resp: 18 20  Temp: 97.3 F (36.3 C)   TempSrc: Oral   SpO2: 95% 100%    Constitutional: NAD, calm, comfortable Vitals:   06/15/16 1453 06/15/16 1750  BP: (!) 119/46 148/70  Pulse: (!) 56 102  Resp: 18 20  Temp: 97.3 F (36.3 C)   TempSrc: Oral   SpO2: 95% 100%   Eyes: PERRL, lids and conjunctivae normal ENMT: Mucous membranes are moist. Posterior pharynx clear of any exudate or lesions.Normal dentition.  Neck: normal, supple, no masses, no thyromegaly Respiratory: clear to auscultation bilaterally, no wheezing, no crackles. Normal respiratory effort. No accessory muscle use.  Cardiovascular: Regular rate and rhythm, no murmurs / rubs / gallops. No extremity edema. 2+ pedal pulses. No carotid bruits.  Abdomen: no tenderness, no masses palpated. No hepatosplenomegaly. Bowel sounds positive.  Musculoskeletal: no clubbing / cyanosis. No joint deformity upper and lower extremities. Good ROM, no contractures. Normal muscle tone.  Skin: Herpetic rash to left side of face with some conjunctivitis.no ulcers. No induration Neurologic: CN 2-12 grossly intact. Sensation intact, DTR normal. Strength 5/5 in all 4.  Psychiatric: Normal judgment and insight. Alert and oriented x 3. Normal mood.    Labs on Admission: I have personally reviewed following labs and imaging studies  CBC:  Recent Labs Lab 06/15/16 1600  WBC 10.2  NEUTROABS 7.0  HGB 13.3  HCT 39.1  MCV 96.5  PLT [id number]   Basic Metabolic Panel:  Recent Labs Lab  06/15/16 1600  NA 133*  K 4.3  CL 97*  CO2 25  GLUCOSE 90  BUN 21*  CREATININE 0.99  CALCIUM 9.3   GFR: CrCl cannot be calculated (Unknown ideal weight.). Liver Function Tests:  Recent Labs Lab 06/15/16 1600  AST 30  ALT 20  ALKPHOS 64  BILITOT 0.3  PROT 7.2  ALBUMIN 4.2   Urine analysis:    Component Value Date/Time   COLORURINE YELLOW 10/13/2015 1223   APPEARANCEUR CLEAR 10/13/2015 1223   LABSPEC 1.015 10/13/2015 1223   PHURINE 6.5 10/13/2015 1223   GLUCOSEU NEGATIVE 10/13/2015 1223   HGBUR NEGATIVE 10/13/2015 1223   HGBUR trace-intact 05/20/2009 1013   BILIRUBINUR NEGATIVE 10/13/2015 1223   BILIRUBINUR neg 04/08/2011 1455   KETONESUR NEGATIVE 10/13/2015 1223   PROTEINUR NEGATIVE 10/13/2015 1223   UROBILINOGEN 0.2 11/20/2012 1218   NITRITE NEGATIVE 10/13/2015 1223   LEUKOCYTESUR NEGATIVE 10/13/2015 1223    Radiological Exams on Admission: Dg Chest 2 View  Result Date: 06/15/2016 CLINICAL DATA:  Recent diagnosis of shingles with weakness and pain EXAM: CHEST  2 VIEW COMPARISON:  10/13/2012 FINDINGS: Cardiac shadow is at the upper limits of normal in size. The lungs are hyper aerated bilaterally. No focal infiltrate or sizable effusion is seen. Changes of prior vertebral augmentation are seen. Chronic sclerotic focus in the proximal left humerus is noted. IMPRESSION: COPD without acute abnormality. Electronically Signed   By: Inez Catalina M.D.   On: 06/15/2016 15:34    Assessment/Plan 82 yo female with recent diagnosis of shingles to left side of face comes in with mild dehydration and weakness  Principal Problem:   Dehydration-  ivf overnight.  PT eval in the am.  Repeat bmp in am.  Active Problems:   Orthostatic hypotension- based on changes in HR in ED.  ivf overnight.   Shingles- cont valtrex and eye drops.     Hypothyroidism- stable   COPD (chronic obstructive pulmonary disease) ([place])- stable and compensated   Atrial fibrillation ([place])- stable    Gait instability- PT  evaluation, hopefully improves some with gentle ivf overnight  Written order to clarify meds from assisted living, family and pt unaware of what meds eye doctor put her on yesterday.    DVT prophylaxis:   scds Code Status:   DNR Family Communication:   Grandson and SIL at bedside Disposition Plan:  Per day team Consults called:   none Admission status:  obs   Nene Aranas A MD [facility]  If 7PM-7AM, please contact night-coverage [email or web address] Password [email or web address]  06/15/2016, 6:45 PM

## 2016-06-15 NOTE — ED Provider Notes (Signed)
[facility] DEPT Provider Note   CSN: [id number] Arrival date & time: 06/15/16  1427     History   Chief Complaint Chief Complaint  Patient presents with  . Medication Reaction    HPI SAMEEN STAIR is a 82 y.o. female.  HPI Pt was diagnosed with shingles a couple of days ago.  She was started on valtrex.  Today the patient was very week and had trouble getting out of bed.  She lives at an assisted living facility and was sent to the ED.  No fevers.  No vomiting but she has been nauseated.  Past Medical History:  Diagnosis Date  . Atrial fibrillation ([place])   . Carcinoma ([place])    "top of left leg, had it taken off; right cheek, there now" (11/20/2012)  . Chronic lower back pain    "from a ruptured lower disc" (11/20/2012)  . COPD (chronic obstructive pulmonary disease) ([place])    "not bothered w/it anymore" (11/20/2012)  . Diverticulitis   . Diverticulosis   . GERD (gastroesophageal reflux disease)   . Hiatal hernia   . Hypothyroidism   . Mitral valve prolapse   . Orthostatic hypotension   . Osteoarthritis   . Osteoporosis   . Rheumatoid arthritis(714.0)   . Shingles   . Vertigo     Patient Active Problem List   Diagnosis Date Noted  . Aspiration into airway   . Pneumonia 10/13/2015  . Aspiration pneumonia ([place]) 10/13/2015  . Sepsis ([place])   . Encephalopathy   . Thyroid activity decreased   . Long term current use of systemic steroids   . Chronic obstructive pulmonary disease ([place])   . Syncope and collapse 11/20/2012  . Hypotension 11/20/2012  . Nausea and vomiting 11/20/2012  . Urinary retention 11/20/2012  . Dysuria R/O UTI 11/20/2012  . Anemia 11/20/2012  . GERD (gastroesophageal reflux disease) 11/20/2012  . Dehydration 01/06/2012  . Gastroenteritis 01/06/2012  . Gait instability 01/06/2012  . Arthritis   . Mitral valve prolapse   . Hiatal hernia   . Diverticulitis   . COPD (chronic obstructive pulmonary disease) ([place])   . Thyroid disease   .  Atrial fibrillation ([place])   . Orthostatic hypotension   . Vertigo   . Osteoporosis   . Diverticulosis   . Syncope 08/12/2011  . CARCINOMA, BASAL CELL 06/11/2010  . Rheumatoid arthritis(714.0) 05/22/2009  . Unspecified hypothyroidism 05/20/2009  . ABDOMINAL PAIN 05/17/2009    Past Surgical History:  Procedure Laterality Date  . BACK SURGERY    . FIXATION KYPHOPLASTY THORACIC SPINE  ~ 2011   "after falling and breaking 2 vertebra" (11/20/2012)  . SKIN CANCER EXCISION Left ~ 2007   "top of my lower leg" (11/20/2012)  . TENDON REPAIR Right ~ 2009   wrist (11/20/2012)  . TONSILLECTOMY  ~ 1933    OB History    No data available       Home Medications    Prior to Admission medications   Medication Sig Start Date End Date Taking? Authorizing Provider  acetaminophen (TYLENOL) 325 MG tablet Take 650 mg by mouth every 4 (four) hours as needed for mild pain or fever. For temperature of 99 or greater. Not to exceed 3000 mg per day. Contact regional provider within 24 hours of fever onset.    Historical Provider, MD  aspirin 81 MG chewable tablet Chew 81 mg by mouth daily.    Historical Provider, MD  calcium carbonate (TUMS - DOSED IN MG ELEMENTAL CALCIUM) 500  MG chewable tablet Chew 1 tablet by mouth daily.    Historical Provider, MD  Cholecalciferol (VITAMIN D3) 5000 units TABS Take 1 tablet (5,000 Units total) by mouth daily. 10/15/15   Asiyah [name], MD  cyanocobalamin 100 MCG tablet Take 100 mcg by mouth daily.    Historical Provider, MD  diclofenac sodium (VOLTAREN) 1 % GEL Apply 2 g topically every 6 (six) hours as needed.    Historical Provider, MD  folic acid (FOLVITE) 1 MG tablet Take 1 mg by mouth daily.    Historical Provider, MD  gabapentin (NEURONTIN) 100 MG capsule Take 100 mg by mouth 2 (two) times daily. 07/15/14   Historical Provider, MD  hydroxychloroquine (PLAQUENIL) 200 MG tablet Take 200 mg by mouth daily.    Historical Provider, MD  hydroxypropyl methylcellulose  (ISOPTO TEARS) 2.5 % ophthalmic solution Place 2 drops into both eyes 2 (two) times daily.    Historical Provider, MD  Lactulose 20 GM/30ML SOLN Take 30 mLs (20 g total) by mouth 2 (two) times daily. Patient not taking: Reported on 10/13/2015 04/05/14   Daleen Bo, MD  leflunomide (ARAVA) 10 MG tablet Take 10 mg by mouth daily.    Historical Provider, MD  levofloxacin (LEVAQUIN) 500 MG tablet Take 1 tablet (500 mg total) by mouth daily. 10/15/15   Asiyah [name], MD  levothyroxine (SYNTHROID, LEVOTHROID) 50 MCG tablet Take 50 mcg by mouth daily. 07/13/14   Historical Provider, MD  magnesium hydroxide (MILK OF MAGNESIA) 400 MG/5ML suspension Take 30 mLs by mouth daily as needed (constipation).    Historical Provider, MD  magnesium oxide (MAG-OX) 400 MG tablet Take 400 mg by mouth daily.    Historical Provider, MD  meclizine (ANTIVERT) 12.5 MG tablet Take 12.5 mg by mouth 3 (three) times daily as needed for dizziness (vertigo).     Historical Provider, MD  morphine (MS CONTIN) 15 MG 12 hr tablet Take 15 mg by mouth daily. 07/05/14   Historical Provider, MD  oxyCODONE (OXY IR/ROXICODONE) 5 MG immediate release tablet Take 5 mg by mouth every 4 (four) hours as needed for severe pain.    Historical Provider, MD  pantoprazole (PROTONIX) 40 MG tablet Take 40 mg by mouth daily.     Historical Provider, MD  predniSONE (DELTASONE) 10 MG tablet Take 4 tablets for 2 days, then 1 tablet for 14 days. 10/15/15   Asiyah [name], MD  promethazine (PHENERGAN) 25 MG tablet Take 25 mg by mouth every 4 (four) hours as needed for nausea or vomiting.    Historical Provider, MD  senna-docusate (SENOKOT-S) 8.6-50 MG per tablet Take 2 tablets by mouth at bedtime as needed (contipation.). May have 1 tablet extra daily as needed    Historical Provider, MD  traMADol (ULTRAM) 50 MG tablet Take 50 mg by mouth every 6 (six) hours as needed for moderate pain.    Historical Provider, MD    Family History Family History    Problem Relation Age of Onset  . Cancer Mother     bladder  . Hyperlipidemia Sister   . Stroke Maternal Aunt     Social History Social History  Substance Use Topics  . Smoking status: Former Smoker    Packs/day: 0.12    Years: 40.00    Types: Cigarettes    Quit date: 09/06/1985  . Smokeless tobacco: Never Used     Comment: 11/20/2012 "stopped smoking 27 yr ago"  . Alcohol use No     Allergies  Lactose intolerance (gi)   Review of Systems Review of Systems  Constitutional: Negative for fever.  Skin: Positive for rash.  Neurological: Positive for weakness.  All other systems reviewed and are negative.    Physical Exam Updated Vital Signs BP (!) 119/46 (BP Location: Right Arm)   Pulse (!) 56   Temp 97.3 F (36.3 C) (Oral)   Resp 18   SpO2 95%   Physical Exam  Constitutional: No distress.  Elderly , frail  HENT:  Head: Normocephalic and atraumatic.  Right Ear: External ear normal.  Left Ear: External ear normal.  Rash with scaling and crusting lesions, left side of face and forehead and scalp  Eyes: Conjunctivae are normal. Right eye exhibits no discharge. Left eye exhibits no discharge. No scleral icterus.  Neck: Neck supple. No tracheal deviation present.  Cardiovascular: Normal rate, regular rhythm and intact distal pulses.   Pulmonary/Chest: Effort normal and breath sounds normal. No stridor. No respiratory distress. She has no wheezes. She has no rales.  Abdominal: Soft. Bowel sounds are normal. She exhibits no distension. There is no tenderness. There is no rebound and no guarding.  Musculoskeletal: She exhibits no edema or tenderness.  Neurological: She is alert. She has normal strength. No cranial nerve deficit (no facial droop, extraocular movements intact, no slurred speech) or sensory deficit. She exhibits normal muscle tone. She displays no seizure activity. Coordination normal.  Skin: Skin is warm and dry. No rash noted.  Psychiatric: She has a  normal mood and affect.  Nursing note and vitals reviewed.    ED Treatments / Results  Labs (all labs ordered are listed, but only abnormal results are displayed) Labs Reviewed  COMPREHENSIVE METABOLIC PANEL - Abnormal; Notable for the following:       Result Value   Sodium 133 (*)    Chloride 97 (*)    BUN 21 (*)    GFR calc non Af Amer 49 (*)    GFR calc Af Amer 57 (*)    All other components within normal limits  CBC WITH DIFFERENTIAL/PLATELET - Abnormal; Notable for the following:    Monocytes Absolute 1.4 (*)    All other components within normal limits  URINALYSIS, ROUTINE W REFLEX MICROSCOPIC (NOT AT [facility])    Procedures Procedures (including critical care time)  Medications Ordered in ED Medications  sodium chloride 0.9 % bolus 500 mL (0 mLs Intravenous Stopped 06/15/16 1751)     Initial Impression / Assessment and Plan / ED Course  I have reviewed the triage vital signs and the nursing notes.  Pertinent labs & imaging results that were available during my care of the patient were reviewed by me and considered in my medical decision making (see chart for details).  Clinical Course  Patient presented to the emergency room with weakness associated with the recent shingles diagnosis. The patient has evidence of shingles on the left face and scalp. Patient was sent to the emergency room because of increasing weakness. She remains hemodynamically stable. She was given IV fluids. We tried to get her walk around the emergency room but she was too weak to stand.  Patient does not live in the nursing facility. She lives in assisted living facility. I will consult the medical service for admission, observation and continued treatment.  Final Clinical Impressions(s) / ED Diagnoses   Final diagnoses:  Dehydration  Herpes zoster with other complication  Weakness    New Prescriptions New Prescriptions   No medications on file  Dorie Rank, MD 06/15/16 [phone]

## 2016-06-15 NOTE — ED Notes (Signed)
Dinner tray given. NAD noted. Family at bedside.

## 2016-06-15 NOTE — ED Notes (Signed)
Patient transported to X-ray 

## 2016-06-16 DIAGNOSIS — M069 Rheumatoid arthritis, unspecified: Secondary | ICD-10-CM | POA: Diagnosis present

## 2016-06-16 DIAGNOSIS — J449 Chronic obstructive pulmonary disease, unspecified: Secondary | ICD-10-CM | POA: Diagnosis present

## 2016-06-16 DIAGNOSIS — I4891 Unspecified atrial fibrillation: Secondary | ICD-10-CM | POA: Diagnosis not present

## 2016-06-16 DIAGNOSIS — B029 Zoster without complications: Secondary | ICD-10-CM | POA: Diagnosis present

## 2016-06-16 DIAGNOSIS — Z85828 Personal history of other malignant neoplasm of skin: Secondary | ICD-10-CM | POA: Diagnosis not present

## 2016-06-16 DIAGNOSIS — J439 Emphysema, unspecified: Secondary | ICD-10-CM

## 2016-06-16 DIAGNOSIS — Z87891 Personal history of nicotine dependence: Secondary | ICD-10-CM | POA: Diagnosis not present

## 2016-06-16 DIAGNOSIS — I10 Essential (primary) hypertension: Secondary | ICD-10-CM | POA: Diagnosis present

## 2016-06-16 DIAGNOSIS — Z7982 Long term (current) use of aspirin: Secondary | ICD-10-CM | POA: Diagnosis not present

## 2016-06-16 DIAGNOSIS — E039 Hypothyroidism, unspecified: Secondary | ICD-10-CM | POA: Diagnosis present

## 2016-06-16 DIAGNOSIS — R531 Weakness: Secondary | ICD-10-CM | POA: Diagnosis present

## 2016-06-16 DIAGNOSIS — Z66 Do not resuscitate: Secondary | ICD-10-CM | POA: Diagnosis present

## 2016-06-16 DIAGNOSIS — B0230 Zoster ocular disease, unspecified: Secondary | ICD-10-CM | POA: Diagnosis not present

## 2016-06-16 DIAGNOSIS — I951 Orthostatic hypotension: Secondary | ICD-10-CM

## 2016-06-16 DIAGNOSIS — Z8052 Family history of malignant neoplasm of bladder: Secondary | ICD-10-CM | POA: Diagnosis not present

## 2016-06-16 DIAGNOSIS — E871 Hypo-osmolality and hyponatremia: Secondary | ICD-10-CM | POA: Diagnosis present

## 2016-06-16 DIAGNOSIS — E860 Dehydration: Secondary | ICD-10-CM | POA: Diagnosis not present

## 2016-06-16 DIAGNOSIS — Z823 Family history of stroke: Secondary | ICD-10-CM | POA: Diagnosis not present

## 2016-06-16 DIAGNOSIS — K219 Gastro-esophageal reflux disease without esophagitis: Secondary | ICD-10-CM | POA: Diagnosis present

## 2016-06-16 DIAGNOSIS — M6281 Muscle weakness (generalized): Secondary | ICD-10-CM | POA: Diagnosis present

## 2016-06-16 DIAGNOSIS — Z91011 Allergy to milk products: Secondary | ICD-10-CM | POA: Diagnosis not present

## 2016-06-16 LAB — BASIC METABOLIC PANEL
Anion gap: 11 (ref 5–15)
BUN: 18 mg/dL (ref 6–20)
CALCIUM: 8.9 mg/dL (ref 8.9–10.3)
CO2: 23 mmol/L (ref 22–32)
CREATININE: 0.8 mg/dL (ref 0.44–1.00)
Chloride: 99 mmol/L — ABNORMAL LOW (ref 101–111)
Glucose, Bld: 95 mg/dL (ref 65–99)
Potassium: 3.9 mmol/L (ref 3.5–5.1)
Sodium: 133 mmol/L — ABNORMAL LOW (ref 135–145)

## 2016-06-16 MED ORDER — SODIUM CHLORIDE 0.9 % IV SOLN
INTRAVENOUS | Status: DC
  Administered 2016-06-16 – 2016-06-19 (×8): via INTRAVENOUS

## 2016-06-16 NOTE — Progress Notes (Signed)
Noted Iv in right arm to be infiltrated when patient came up from ED. Discontinued IV. Attempted to start Iv in left arm with 1 unsuccessful attempt. IV consult ordered

## 2016-06-16 NOTE — Care Management Obs Status (Addendum)
[facility] NOTIFICATION   Patient Details  Name: SYLENA RASUL MRN: [id number] Date of Birth: Oct 10, 1923   Medicare Observation Status Notification Given:  Yes  Pt confused, unable to sign. Daughter not in room. Copy left at bedside.  Lynnell Catalan, RN 06/16/2016, 2:19 PM

## 2016-06-16 NOTE — Progress Notes (Signed)
PT Cancellation Note  Patient Details Name: SAHAR ANCHETA MRN: [id number] DOB: 11-28-1923   Cancelled Treatment:    Reason Eval/Treat Not Completed: Other (comment) (PT will evaluate  in AM.)   Marcelino Freestone PT [id number]  06/16/2016, 5:41 PM

## 2016-06-16 NOTE — Progress Notes (Signed)
PROGRESS NOTE    [name]  [id number] DOB: Jan 13, 1924 DOA: 06/15/2016 PCP: Willodean Rosenthal, MD    Brief Narrative: [name] is a 82 y.o. female with medical history significant of CHF, CKD recent shingles on her face started on valtrex , seen by an opthalmology on 10/9 , presented to ED for unable to walk. She was found to be dehydrated.   Assessment & Plan:   Principal Problem:   Dehydration Active Problems:   Hypothyroidism   COPD (chronic obstructive pulmonary disease) (HCC)   Atrial fibrillation (HCC)   Orthostatic hypotension   Gait instability   Shingles  Dehydration; Gentle hydration. Will need at least another day of IV fluids and PT evaluation.    Unable to ambulate: from generalized weakness. PT eval.   Shingles:  On valtrex and eye drops.    Hypothyroidism: Resume synthroid.   COPD: No wheezing heard. Stable and compensated.  duonebs as needed.  CXR shows copd without acute abnormality.    Orthostatic Hypotension:  Repeat vital signs.    Atrial fibrillation; Rate controlled. On aspirin.   Hyponatremia:  Stable.   Rheumatoid arthritis: resume home meds.     DVT prophylaxis: (SCD'S) Code Status: (DNR) Family Communication: none at bedside, Called her daughter.  Disposition Plan: pending PT EVAL.    Consultants:    none  Procedures: Home   Antimicrobials: valtrex   Subjective: Wants to go home.   Objective: Vitals:   06/15/16 1750 06/15/16 1956 06/15/16 2031 06/16/16 0453  BP: 148/70 136/72 (!) 167/72 (!) 154/67  Pulse: 102 99 87 86  Resp: 20 18 18    Temp:   98.6 F (37 C) 97.6 F (36.4 C)  TempSrc:   Oral Oral  SpO2: 100% 100% 96% 95%  Weight:   52.2 kg (115 lb 1.3 oz)   Height:   5\' 6"  (1.676 m)     Intake/Output Summary (Last 24 hours) at 06/16/16 1105 Last data filed at 06/16/16 0907  Gross per 24 hour  Intake              620 ml  Output              250 ml  Net              370 ml   Filed Weights    06/15/16 2031  Weight: 52.2 kg (115 lb 1.3 oz)    Examination:  General exam: Appears calm and comfortable  Respiratory system: Clear to auscultation. Respiratory effort normal. Cardiovascular system: S1 & S2 heard, RRR. No JVD, murmurs, rubs, gallops or clicks. No pedal edema. Gastrointestinal system: Abdomen is nondistended, soft and nontender. No organomegaly or masses felt. Normal bowel sounds heard. Central nervous system: Alert and oriented. No focal neurological deficits. Extremities: Symmetric 5 x 5 power. Skin: No rashes, lesions or ulcers Psychiatry: Judgement and insight appear normal. Mood & affect appropriate.     Data Reviewed: I have personally reviewed following labs and imaging studies  CBC:  Recent Labs Lab 06/15/16 1600  WBC 10.2  NEUTROABS 7.0  HGB 13.3  HCT 39.1  MCV 96.5  PLT [id number]   Basic Metabolic Panel:  Recent Labs Lab 06/15/16 1600 06/16/16 0406  NA 133* 133*  K 4.3 3.9  CL 97* 99*  CO2 25 23  GLUCOSE 90 95  BUN 21* 18  CREATININE 0.99 0.80  CALCIUM 9.3 8.9   GFR: Estimated Creatinine Clearance: 39.3 mL/min (by C-G formula based on SCr  of 0.8 mg/dL). Liver Function Tests:  Recent Labs Lab 06/15/16 1600  AST 30  ALT 20  ALKPHOS 64  BILITOT 0.3  PROT 7.2  ALBUMIN 4.2   No results for input(s): LIPASE, AMYLASE in the last 168 hours. No results for input(s): AMMONIA in the last 168 hours. Coagulation Profile: No results for input(s): INR, PROTIME in the last 168 hours. Cardiac Enzymes: No results for input(s): CKTOTAL, CKMB, CKMBINDEX, TROPONINI in the last 168 hours. BNP (last 3 results) No results for input(s): PROBNP in the last 8760 hours. HbA1C: No results for input(s): HGBA1C in the last 72 hours. CBG: No results for input(s): GLUCAP in the last 168 hours. Lipid Profile: No results for input(s): CHOL, HDL, LDLCALC, TRIG, CHOLHDL, LDLDIRECT in the last 72 hours. Thyroid Function Tests: No results for input(s):  TSH, T4TOTAL, FREET4, T3FREE, THYROIDAB in the last 72 hours. Anemia Panel: No results for input(s): VITAMINB12, FOLATE, FERRITIN, TIBC, IRON, RETICCTPCT in the last 72 hours. Sepsis Labs: No results for input(s): PROCALCITON, LATICACIDVEN in the last 168 hours.  No results found for this or any previous visit (from the past 240 hour(s)).       Radiology Studies: Dg Chest 2 View  Result Date: 06/15/2016 CLINICAL DATA:  Recent diagnosis of shingles with weakness and pain EXAM: CHEST  2 VIEW COMPARISON:  10/13/2012 FINDINGS: Cardiac shadow is at the upper limits of normal in size. The lungs are hyper aerated bilaterally. No focal infiltrate or sizable effusion is seen. Changes of prior vertebral augmentation are seen. Chronic sclerotic focus in the proximal left humerus is noted. IMPRESSION: COPD without acute abnormality. Electronically Signed   By: Inez Catalina M.D.   On: 06/15/2016 15:34        Scheduled Meds: . acetaminophen  1,000 mg Oral QPM  . aspirin  81 mg Oral Daily  . calcium carbonate  1 tablet Oral Daily  . cholecalciferol  2,000 Units Oral Daily  . erythromycin  1 application Left Eye BID  . folic acid  1 mg Oral Daily  . gabapentin  100 mg Oral BID  . hydroxychloroquine  200 mg Oral QHS  . levothyroxine  50 mcg Oral QAC breakfast  . magnesium gluconate  500 mg Oral BID  . morphine  15 mg Oral Daily  . pantoprazole  40 mg Oral QAC breakfast  . polyvinyl alcohol  1 drop Left Eye Q2H  . predniSONE  5 mg Oral Q breakfast  . senna-docusate  2 tablet Oral QHS  . valACYclovir  500 mg Oral Q8H  . cyanocobalamin  100 mcg Oral Daily   Continuous Infusions:    LOS: 0 days    Time spent: 30 minutes    California Huberty, MD [facility] Pager [phone]  If 7PM-7AM, please contact night-coverage [email or web address] Password [email or web address] 06/16/2016, 11:05 AM

## 2016-06-17 DIAGNOSIS — E039 Hypothyroidism, unspecified: Secondary | ICD-10-CM

## 2016-06-17 NOTE — Evaluation (Signed)
Physical Therapy Evaluation Patient Details Name: [name] MRN: [id number] DOB: 01-24-1924 Today's Date: 06/17/2016   History of Present Illness  82 yo female admitted with dehydration. Recent diagnosis of shingles. Hx of CHF,CKD, COPD, orthostatic hypotension, gait instability  Clinical Impression  On eval, pt requires Mod assist to stand and Min assist to ambulate ~12 feet in room with a RW. Pt is generally weak and unsteady at this time. Recommend ST rehab at SNF if pt is agreeable. Pt could potentially return to ALF with HHPT if facility can provide current level of care/assist.     Follow Up Recommendations SNF (unless ALF can provide current level of care/increased assistance)    Equipment Recommendations  None recommended by PT    Recommendations for Other Services       Precautions / Restrictions Precautions Precautions: Fall Precaution Comments: airborne (shingles) Restrictions Weight Bearing Restrictions: No      Mobility  Bed Mobility Overal bed mobility: Needs Assistance Bed Mobility: Supine to Sit;Sit to Supine     Supine to sit: Min guard;HOB elevated Sit to supine: Min guard;HOB elevated   General bed mobility comments: close guard for safety. Increased time.   Transfers Overall transfer level: Needs assistance Equipment used: Rolling walker (2 wheeled) Transfers: Sit to/from Stand Sit to Stand: Mod assist         General transfer comment: Assist to rise, stabilize, control descent. VCs safety, hand placement  Ambulation/Gait Ambulation/Gait assistance: Min assist Ambulation Distance (Feet): 12 Feet (in room) Assistive device: Rolling walker (2 wheeled) Gait Pattern/deviations: Step-through pattern;Decreased step length - right;Decreased step length - left;Decreased stride length;Wide base of support     General Gait Details: Assist to stabilize pt and to maneuver with RW. Pt is unsteady.   Stairs            Wheelchair Mobility     Modified Rankin (Stroke Patients Only)       Balance Overall balance assessment: Needs assistance         Standing balance support: Bilateral upper extremity supported Standing balance-Leahy Scale: Poor                               Pertinent Vitals/Pain Pain Assessment: Faces Faces Pain Scale: Hurts a little bit Pain Intervention(s): Monitored during session    Home Living Family/patient expects to be discharged to:: Assisted living               Home Equipment: Walker - 4 wheels;Grab bars - toilet      Prior Function Level of Independence: Needs assistance   Gait / Transfers Assistance Needed: independent with Rollator  ADL's / Homemaking Assistance Needed: ALF provides all meals (dining hall), administers medication, does all cleaning and laundry  Comments: sponge bathes only     Hand Dominance        Extremity/Trunk Assessment   Upper Extremity Assessment: Generalized weakness           Lower Extremity Assessment: Generalized weakness         Communication   Communication: HOH  Cognition Arousal/Alertness: Awake/alert Behavior During Therapy: WFL for tasks assessed/performed Overall Cognitive Status: No family/caregiver present to determine baseline cognitive functioning                      General Comments      Exercises     Assessment/Plan    PT Assessment Patient needs  continued PT services  PT Problem List Decreased strength;Decreased mobility;Decreased activity tolerance;Decreased balance;Decreased cognition          PT Treatment Interventions DME instruction;Gait training;Therapeutic activities;Therapeutic exercise;Patient/family education;Functional mobility training;Balance training    PT Goals (Current goals can be found in the Care Plan section)  Acute Rehab PT Goals Patient Stated Goal: "I gotta get out of here" PT Goal Formulation: With patient Time For Goal Achievement: 07/01/16 Potential  to Achieve Goals: Good    Frequency Min 3X/week   Barriers to discharge        Co-evaluation               End of Session Equipment Utilized During Treatment: Gait belt Activity Tolerance: Patient limited by fatigue Patient left: in bed;with call bell/phone within reach;with bed alarm set           Time: [id number] PT Time Calculation (min) (ACUTE ONLY): 20 min   Charges:   PT Evaluation $PT Eval Low Complexity: 1 Procedure     PT G Codes:        Weston Anna, MPT Pager: [phone]

## 2016-06-17 NOTE — Progress Notes (Signed)
PROGRESS NOTE    [name]  [id number] DOB: 1924/01/06 DOA: 06/15/2016 PCP: Willodean Rosenthal, MD    Brief Narrative: [name] is a 82 y.o. female with medical history significant of CHF, CKD recent shingles on her face started on valtrex , seen by an opthalmology on 10/9 , presented to ED for unable to walk. She was found to be dehydrated.   Assessment & Plan:   Principal Problem:   Dehydration Active Problems:   Hypothyroidism   COPD (chronic obstructive pulmonary disease) (HCC)   Atrial fibrillation (HCC)   Orthostatic hypotension   Gait instability   Shingles  Dehydration; Improving. On IV fluids. PT eval recommended SNF.  Will get SW evaluation.   Unable to ambulate: from generalized weakness. PT eval recommended SNF.    Shingles:  On valtrex and eye drops.  Pain better controlled today.    Hypothyroidism: Resume synthroid.   COPD: No wheezing heard. Stable and compensated.  duonebs as needed.  CXR shows copd without acute abnormality.    Orthostatic Hypotension:  Repeat vital signs pending.   Atrial fibrillation; Rate controlled. On aspirin.   Hyponatremia:  Stable at 133.   Rheumatoid arthritis: resume home meds.     DVT prophylaxis: (SCD'S) Code Status: (DNR) Family Communication: none at bedside, discussed with daughter .  Disposition Plan: pending PT EVAL.    Consultants:    none  Procedures: Home   Antimicrobials: valtrex   Subjective: Confused , wants to go home.   Objective: Vitals:   06/16/16 0453 06/16/16 1416 06/16/16 2200 06/17/16 0617  BP: (!) 154/67 (!) 156/71 (!) 158/63 (!) 159/68  Pulse: 86 84 75 71  Resp:  18 15 15   Temp: 97.6 F (36.4 C) 98.9 F (37.2 C) 98 F (36.7 C) 97.6 F (36.4 C)  TempSrc: Oral Oral Oral Oral  SpO2: 95% 99% 100% 100%  Weight:      Height:        Intake/Output Summary (Last 24 hours) at 06/17/16 1315 Last data filed at 06/17/16 1135  Gross per 24 hour  Intake           2156.67 ml  Output              900 ml  Net          1256.67 ml   Filed Weights   06/15/16 2031  Weight: 52.2 kg (115 lb 1.3 oz)    Examination:  General exam: Appears calm and comfortable  Respiratory system: Clear to auscultation. Respiratory effort normal. Cardiovascular system: S1 & S2 heard, RRR. No JVD, murmurs, rubs, gallops or clicks. No pedal edema. Gastrointestinal system: Abdomen is nondistended, soft and nontender. No organomegaly or masses felt. Normal bowel sounds heard. Central nervous system: Alert and oriented. No focal neurological deficits. Extremities: Symmetric 5 x 5 power. Skin: No rashes, lesions or ulcers Psychiatry: Judgement and insight appear normal. Mood & affect appropriate.     Data Reviewed: I have personally reviewed following labs and imaging studies  CBC:  Recent Labs Lab 06/15/16 1600  WBC 10.2  NEUTROABS 7.0  HGB 13.3  HCT 39.1  MCV 96.5  PLT [id number]   Basic Metabolic Panel:  Recent Labs Lab 06/15/16 1600 06/16/16 0406  NA 133* 133*  K 4.3 3.9  CL 97* 99*  CO2 25 23  GLUCOSE 90 95  BUN 21* 18  CREATININE 0.99 0.80  CALCIUM 9.3 8.9   GFR: Estimated Creatinine Clearance: 39.3 mL/min (by C-G formula  based on SCr of 0.8 mg/dL). Liver Function Tests:  Recent Labs Lab 06/15/16 1600  AST 30  ALT 20  ALKPHOS 64  BILITOT 0.3  PROT 7.2  ALBUMIN 4.2   No results for input(s): LIPASE, AMYLASE in the last 168 hours. No results for input(s): AMMONIA in the last 168 hours. Coagulation Profile: No results for input(s): INR, PROTIME in the last 168 hours. Cardiac Enzymes: No results for input(s): CKTOTAL, CKMB, CKMBINDEX, TROPONINI in the last 168 hours. BNP (last 3 results) No results for input(s): PROBNP in the last 8760 hours. HbA1C: No results for input(s): HGBA1C in the last 72 hours. CBG: No results for input(s): GLUCAP in the last 168 hours. Lipid Profile: No results for input(s): CHOL, HDL, LDLCALC, TRIG, CHOLHDL,  LDLDIRECT in the last 72 hours. Thyroid Function Tests: No results for input(s): TSH, T4TOTAL, FREET4, T3FREE, THYROIDAB in the last 72 hours. Anemia Panel: No results for input(s): VITAMINB12, FOLATE, FERRITIN, TIBC, IRON, RETICCTPCT in the last 72 hours. Sepsis Labs: No results for input(s): PROCALCITON, LATICACIDVEN in the last 168 hours.  No results found for this or any previous visit (from the past 240 hour(s)).       Radiology Studies: Dg Chest 2 View  Result Date: 06/15/2016 CLINICAL DATA:  Recent diagnosis of shingles with weakness and pain EXAM: CHEST  2 VIEW COMPARISON:  10/13/2012 FINDINGS: Cardiac shadow is at the upper limits of normal in size. The lungs are hyper aerated bilaterally. No focal infiltrate or sizable effusion is seen. Changes of prior vertebral augmentation are seen. Chronic sclerotic focus in the proximal left humerus is noted. IMPRESSION: COPD without acute abnormality. Electronically Signed   By: Inez Catalina M.D.   On: 06/15/2016 15:34        Scheduled Meds: . acetaminophen  1,000 mg Oral QPM  . aspirin  81 mg Oral Daily  . calcium carbonate  1 tablet Oral Daily  . cholecalciferol  2,000 Units Oral Daily  . erythromycin  1 application Left Eye BID  . folic acid  1 mg Oral Daily  . gabapentin  100 mg Oral BID  . hydroxychloroquine  200 mg Oral QHS  . levothyroxine  50 mcg Oral QAC breakfast  . magnesium gluconate  500 mg Oral BID  . morphine  15 mg Oral Daily  . pantoprazole  40 mg Oral QAC breakfast  . polyvinyl alcohol  1 drop Left Eye Q2H  . predniSONE  5 mg Oral Q breakfast  . senna-docusate  2 tablet Oral QHS  . valACYclovir  500 mg Oral Q8H  . cyanocobalamin  100 mcg Oral Daily   Continuous Infusions: . sodium chloride 100 mL/hr at 06/17/16 1129     LOS: 1 day    Time spent: 55 minutes    Elajah Kunsman, MD [facility] Pager [phone]  If 7PM-7AM, please contact night-coverage [email or web address] Password  [email or web address] 06/17/2016, 1:15 PM

## 2016-06-18 MED ORDER — DEXTROSE 5 % IV SOLN
10.0000 mg/kg | Freq: Two times a day (BID) | INTRAVENOUS | Status: DC
  Administered 2016-06-18 – 2016-06-20 (×5): 520 mg via INTRAVENOUS
  Filled 2016-06-18 (×5): qty 10.4

## 2016-06-18 NOTE — Clinical Social Work Note (Signed)
Clinical Social Work Assessment  Patient Details  Name: [name] MRN: [id number] Date of Birth: [birth year]  Date of referral:  06/18/16               Reason for consult:  Facility Placement                Permission sought to share information with:  Facility Art therapist granted to share information::     Name::        Agency::  [facility]  Relationship::   Daughter  Contact Information:   [name] [phone]  Housing/Transportation Living arrangements for the past 2 months:  [place] of Information:  Patient, Adult Children Patient Interpreter Needed:  None Criminal Activity/Legal Involvement Pertinent to Current Situation/Hospitalization:  No - Comment as needed Significant Relationships:  Adult Children Lives with:  Facility Resident Do you feel safe going back to the place where you live?  Yes Need for family participation in patient care:  Yes (Comment)  Care giving concerns:  Patient is from [facility] and has been there for several years. Physical therapy recommended SNF vs. Home Health PT at ALF. Patient has not met three qualifying stay for Medicare to cover cost for PT. Patient will return to ALF with [facility] PT.    Social Worker assessment / plan:  LCSWA gather collateral information from patient from daughter. She agrees the patient will return to the ALF with physical therapy Home Health as the family can not cover the cost for SNF.  LCSWA contacted [facility] admissions Coordinator [name] to assist with patient disposition to back to ALF.   Employment status:  Retired Health visitor PT Recommendations:  Home with [employer], [place], [place] / Referral to community resources:    FL2   Patient/Family's Response to care:  Agreeable to care.   Patient/Family's Understanding of and Emotional Response to Diagnosis, Current Treatment, and  Prognosis: Patient will return to ALF to continue with treatment.   Emotional Assessment Appearance:  Appears older than stated age Attitude/Demeanor/Rapport:    Affect (typically observed):  Accepting, Calm, Pleasant Orientation:  Oriented to Self, Oriented to Place, Oriented to Situation Alcohol / Substance use:  Not Applicable Psych involvement (Current and /or in the community):  No (Comment)  Discharge Needs  Concerns to be addressed:  No discharge needs identified Readmission within the last 30 days:  No Current discharge risk:  None Barriers to Discharge:  No Barriers Identified   Lia Hopping, LCSW 06/18/2016, 10:18 AM

## 2016-06-18 NOTE — Progress Notes (Signed)
PROGRESS NOTE    [name]  [id number] DOB: 01-22-1924 DOA: 06/15/2016 PCP: Willodean Rosenthal, MD    Brief Narrative: [name] is a 82 y.o. female with medical history significant of CHF, CKD recent shingles on her face started on valtrex , seen by an opthalmology on 10/9 , presented to ED for unable to walk. She was found to be dehydrated.   Assessment & Plan:   Principal Problem:   Dehydration Active Problems:   Hypothyroidism   COPD (chronic obstructive pulmonary disease) (HCC)   Atrial fibrillation (HCC)   Orthostatic hypotension   Gait instability   Shingles  Dehydration; Improving. On IV fluids. PT eval recommended SNF.  Will get SW evaluation.   Unable to ambulate: from generalized weakness and deconditioning. PT eval recommended SNF.    Hypertension:  Sub optimal.  Better controlled than yesterday.    Shingles:  On valtrex and eye drops.  The crusts over the trigeminal area appear slightly worsened, changed to IV acyclovir.  Resume the eye drops.    Hypothyroidism: Resume synthroid.   COPD: No wheezing heard. Stable and compensated.  duonebs as needed.  CXR shows copd without acute abnormality.    Orthostatic Hypotension:  Repeat vital signs negative.    Atrial fibrillation; Rate controlled. On aspirin.   Hyponatremia:  Stable at 133.   Rheumatoid arthritis: resume home meds.     DVT prophylaxis: (SCD'S) Code Status: (DNR) Family Communication: none at bedside, discussed with daughter on 10/11 .  Disposition Plan: SNF vs ALF with home health needs.    Consultants:    none  Procedures: Home   Antimicrobials: valtrex   Subjective: Very anxious to go home.   Objective: Vitals:   06/17/16 1415 06/17/16 2042 06/18/16 0518 06/18/16 1340  BP: (!) 162/78 (!) 151/69 (!) 147/62 (!) 142/68  Pulse: 89 74 61 70  Resp: 16 16 18 18   Temp: 97.9 F (36.6 C) 98 F (36.7 C) 97.9 F (36.6 C) 98 F (36.7 C)  TempSrc: Oral Oral  Oral Oral  SpO2: 93% 97% 95% 96%  Weight:      Height:        Intake/Output Summary (Last 24 hours) at 06/18/16 1825 Last data filed at 06/18/16 1717  Gross per 24 hour  Intake             3115 ml  Output             1200 ml  Net             1915 ml   Filed Weights   06/15/16 2031  Weight: 52.2 kg (115 lb 1.3 oz)    Examination:  General exam: Appears calm and comfortable , crusts over the left side of the forehead.  Respiratory system: Clear to auscultation. Respiratory effort normal. Cardiovascular system: S1 & S2 heard, RRR. No JVD, murmurs, rubs, gallops or clicks. No pedal edema. Gastrointestinal system: Abdomen is nondistended, soft and nontender. No organomegaly or masses felt. Normal bowel sounds heard. Central nervous system: Alert and oriented. No focal neurological deficits. Extremities: Symmetric 5 x 5 power. Skin: No rashes, lesions or ulcers Psychiatry: Judgement and insight appear normal. Mood & affect appropriate.     Data Reviewed: I have personally reviewed following labs and imaging studies  CBC:  Recent Labs Lab 06/15/16 1600  WBC 10.2  NEUTROABS 7.0  HGB 13.3  HCT 39.1  MCV 96.5  PLT A999333   Basic Metabolic Panel:  Recent Labs  Lab 06/15/16 1600 06/16/16 0406  NA 133* 133*  K 4.3 3.9  CL 97* 99*  CO2 25 23  GLUCOSE 90 95  BUN 21* 18  CREATININE 0.99 0.80  CALCIUM 9.3 8.9   GFR: Estimated Creatinine Clearance: 38.5 mL/min (by C-G formula based on SCr of 0.8 mg/dL). Liver Function Tests:  Recent Labs Lab 06/15/16 1600  AST 30  ALT 20  ALKPHOS 64  BILITOT 0.3  PROT 7.2  ALBUMIN 4.2   No results for input(s): LIPASE, AMYLASE in the last 168 hours. No results for input(s): AMMONIA in the last 168 hours. Coagulation Profile: No results for input(s): INR, PROTIME in the last 168 hours. Cardiac Enzymes: No results for input(s): CKTOTAL, CKMB, CKMBINDEX, TROPONINI in the last 168 hours. BNP (last 3 results) No results for  input(s): PROBNP in the last 8760 hours. HbA1C: No results for input(s): HGBA1C in the last 72 hours. CBG: No results for input(s): GLUCAP in the last 168 hours. Lipid Profile: No results for input(s): CHOL, HDL, LDLCALC, TRIG, CHOLHDL, LDLDIRECT in the last 72 hours. Thyroid Function Tests: No results for input(s): TSH, T4TOTAL, FREET4, T3FREE, THYROIDAB in the last 72 hours. Anemia Panel: No results for input(s): VITAMINB12, FOLATE, FERRITIN, TIBC, IRON, RETICCTPCT in the last 72 hours. Sepsis Labs: No results for input(s): PROCALCITON, LATICACIDVEN in the last 168 hours.  No results found for this or any previous visit (from the past 240 hour(s)).       Radiology Studies: No results found.      Scheduled Meds: . acetaminophen  1,000 mg Oral QPM  . acyclovir  10 mg/kg Intravenous Q12H  . aspirin  81 mg Oral Daily  . calcium carbonate  1 tablet Oral Daily  . cholecalciferol  2,000 Units Oral Daily  . erythromycin  1 application Left Eye BID  . folic acid  1 mg Oral Daily  . gabapentin  100 mg Oral BID  . hydroxychloroquine  200 mg Oral QHS  . levothyroxine  50 mcg Oral QAC breakfast  . magnesium gluconate  500 mg Oral BID  . morphine  15 mg Oral Daily  . pantoprazole  40 mg Oral QAC breakfast  . polyvinyl alcohol  1 drop Left Eye Q2H  . predniSONE  5 mg Oral Q breakfast  . senna-docusate  2 tablet Oral QHS  . cyanocobalamin  100 mcg Oral Daily   Continuous Infusions: . sodium chloride 100 mL/hr at 06/18/16 0515     LOS: 2 days    Time spent: 23 minutes    Orvell Careaga, MD [facility] Pager [phone]  If 7PM-7AM, please contact night-coverage [email or web address] Password [facility] 06/18/2016, 6:25 PM

## 2016-06-18 NOTE — Progress Notes (Signed)
Pt to dc back to ALF with [facility] services. HH orders faxed to [facility]. No other CM needs communicated. Marney Doctor RN,BSN,NCM [phone]

## 2016-06-18 NOTE — Progress Notes (Signed)
Pharmacy Antibiotic Note  [name] is a 82 y.o. female admitted on 06/15/2016 with shingles.  Pt is immunocompromised, on chronic prednisone for RA, with severe disease affecting eyes, seen by opthalmology on 10/9.   Pharmacy has been consulted for acyclovir dosing.  10/13: Pt started on valacyclovir on 06/15/16.  CrCl 38 ml/min.   WBC WNL.  Afebrile.    Plan: Stop valacyclovir Start acyclovir 10mg /kg (TBW) q12h  F/u renal function closely to adjust dose.     Height: 5\' 6"  (167.6 cm) Weight: 115 lb 1.3 oz (52.2 kg) IBW/kg (Calculated) : 59.3  Temp (24hrs), Avg:97.9 F (36.6 C), Min:97.9 F (36.6 C), Max:98 F (36.7 C)   Recent Labs Lab 06/15/16 1600 06/16/16 0406  WBC 10.2  --   CREATININE 0.99 0.80    Estimated Creatinine Clearance: 38.5 mL/min (by C-G formula based on SCr of 0.8 mg/dL).    Allergies  Allergen Reactions  . Lactose Intolerance (Gi) Other (See Comments)    Reaction:  GI upset     Antimicrobials this admission: 10/10 valacyclovir >> 10/13 10/13 acyclovir >>   Dose adjustments this admission:   Microbiology results: None  Thank you for allowing pharmacy to be a part of this patient's care.  Ralene Bathe, PharmD, BCPS 06/18/2016, 12:37 PM  Pager: [phone]

## 2016-06-18 NOTE — NC FL2 (Signed)
[place] LEVEL OF CARE SCREENING TOOL     IDENTIFICATION  Patient Name: [name] Birthdate: 02-Sep-1924 Sex: female Admission Date (Current Location): 06/15/2016  [facility] and [place] Number:  Herbalist and Address:  [facility],  [place] [address] [place]      Provider Number: [id number]  Attending Physician Name and Address:  Hosie Poisson, MD  Relative Name and Phone Number:  [name] - daughter. Phone number [phone].    Current Level of Care: Hospital Recommended Level of Care: [place] Prior Approval Number:    Date Approved/Denied:   PASRR Number:    Discharge Plan: Other (Comment)    Current Diagnoses: Patient Active Problem List   Diagnosis Date Noted  . Shingles 06/15/2016  . Aspiration into airway   . Pneumonia 10/13/2015  . Aspiration pneumonia ([place]) 10/13/2015  . Sepsis ([place])   . Encephalopathy   . Thyroid activity decreased   . Long term current use of systemic steroids   . Chronic obstructive pulmonary disease ([place])   . Syncope and collapse 11/20/2012  . Hypotension 11/20/2012  . Nausea and vomiting 11/20/2012  . Urinary retention 11/20/2012  . Dysuria R/O UTI 11/20/2012  . Anemia 11/20/2012  . GERD (gastroesophageal reflux disease) 11/20/2012  . Dehydration 01/06/2012  . Gastroenteritis 01/06/2012  . Gait instability 01/06/2012  . Arthritis   . Mitral valve prolapse   . Hiatal hernia   . Diverticulitis   . COPD (chronic obstructive pulmonary disease) ([place])   . Thyroid disease   . Atrial fibrillation ([place])   . Orthostatic hypotension   . Vertigo   . Osteoporosis   . Diverticulosis   . Syncope 08/12/2011  . CARCINOMA, BASAL CELL 06/11/2010  . Rheumatoid arthritis(714.0) 05/22/2009  . Hypothyroidism 05/20/2009  . ABDOMINAL PAIN 05/17/2009    Orientation RESPIRATION BLADDER Height & Weight     Self, Time, Situation, Place  Normal Continent Weight: 115 lb 1.3  oz (52.2 kg) Height:  5\' 6"  (167.6 cm)  BEHAVIORAL SYMPTOMS/MOOD NEUROLOGICAL BOWEL NUTRITION STATUS        Diet  AMBULATORY STATUS COMMUNICATION OF NEEDS Skin   Limited Assist Verbally Normal                       Personal Care Assistance Level of Assistance  Bathing, Feeding, Dressing Bathing Assistance: Limited assistance Feeding assistance: Independent Dressing Assistance: Limited assistance     Functional Limitations Info  Hearing, Speech Sight Info: Adequate Hearing Info: Adequate Speech Info: Adequate    SPECIAL CARE FACTORS FREQUENCY  PT (By licensed PT)     PT Frequency: 5              Contractures Contractures Info: Not present    Additional Factors Info  Code Status, Allergies Code Status Info: DNR Allergies Info: Lactose Intolerance (Gi)           Current Medications (06/18/2016):  This is the current hospital active medication list Current Facility-Administered Medications  Medication Dose Route Frequency Provider Last Rate Last Dose  . 0.9 %  sodium chloride infusion   Intravenous Continuous Hosie Poisson, MD 100 mL/hr at 06/18/16 0515    . acetaminophen (TYLENOL) tablet 1,000 mg  1,000 mg Oral QPM Phillips Grout, MD   1,000 mg at 06/17/16 1725  . acetaminophen (TYLENOL) tablet 650 mg  650 mg Oral Q4H PRN Phillips Grout, MD   650 mg at 06/17/16 0818  .  aspirin chewable tablet 81 mg  81 mg Oral Daily Phillips Grout, MD   81 mg at 06/17/16 1012  . bisacodyl (DULCOLAX) suppository 10 mg  10 mg Rectal PRN Phillips Grout, MD   10 mg at 06/18/16 0835  . calcium carbonate (TUMS - dosed in mg elemental calcium) chewable tablet 200 mg of elemental calcium  1 tablet Oral Daily Phillips Grout, MD   200 mg of elemental calcium at 06/17/16 1015  . cholecalciferol (VITAMIN D) tablet 2,000 Units  2,000 Units Oral Daily Phillips Grout, MD   2,000 Units at 06/17/16 1010  . diclofenac sodium (VOLTAREN) 1 % transdermal gel 2 g  2 g Topical Q6H PRN Phillips Grout,  MD      . erythromycin ophthalmic ointment 1 application  1 application Left Eye BID Phillips Grout, MD   1 application at [id number]  . folic acid (FOLVITE) tablet 1 mg  1 mg Oral Daily Phillips Grout, MD   1 mg at 06/17/16 1013  . gabapentin (NEURONTIN) capsule 100 mg  100 mg Oral BID Phillips Grout, MD   100 mg at 06/17/16 2123  . hydroxychloroquine (PLAQUENIL) tablet 200 mg  200 mg Oral QHS Phillips Grout, MD   200 mg at 06/17/16 2123  . levothyroxine (SYNTHROID, LEVOTHROID) tablet 50 mcg  50 mcg Oral QAC breakfast Phillips Grout, MD   50 mcg at 06/18/16 0829  . loperamide (IMODIUM) capsule 2 mg  2 mg Oral PRN Phillips Grout, MD      . magic mouthwash  10 mL Oral BID PRN Phillips Grout, MD      . magnesium gluconate (MAGONATE) tablet 500 mg  500 mg Oral BID Phillips Grout, MD   500 mg at 06/17/16 2123  . magnesium hydroxide (MILK OF MAGNESIA) suspension 30 mL  30 mL Oral Daily PRN Phillips Grout, MD      . meclizine (ANTIVERT) tablet 12.5 mg  12.5 mg Oral TID PRN Phillips Grout, MD      . morphine (MS CONTIN) 12 hr tablet 15 mg  15 mg Oral Daily Phillips Grout, MD   15 mg at 06/17/16 1013  . ondansetron (ZOFRAN) tablet 4 mg  4 mg Oral Q6H PRN Phillips Grout, MD       Or  . ondansetron [facility]) injection 4 mg  4 mg Intravenous Q6H PRN Phillips Grout, MD   4 mg at 06/17/16 1340  . oxyCODONE (Oxy IR/ROXICODONE) immediate release tablet 5 mg  5 mg Oral Q4H PRN Phillips Grout, MD   5 mg at 06/17/16 0608  . pantoprazole (PROTONIX) EC tablet 40 mg  40 mg Oral QAC breakfast Phillips Grout, MD   40 mg at 06/18/16 0829  . polyethylene glycol (MIRALAX / GLYCOLAX) packet 17 g  17 g Oral Daily PRN Phillips Grout, MD      . polyvinyl alcohol (LIQUIFILM TEARS) 1.4 % ophthalmic solution 1 drop  1 drop Left Eye Q2H Phillips Grout, MD   1 drop at 06/18/16 0830  . predniSONE (DELTASONE) tablet 5 mg  5 mg Oral Q breakfast Phillips Grout, MD   5 mg at 06/18/16 0829  . promethazine (PHENERGAN) tablet 25 mg  25  mg Oral Q4H PRN Phillips Grout, MD      . senna-docusate (Senokot-S) tablet 2 tablet  2 tablet Oral QHS Phillips Grout, MD  2 tablet at 06/17/16 2123  . traMADol (ULTRAM) tablet 50 mg  50 mg Oral Q6H PRN Phillips Grout, MD      . valACYclovir (VALTREX) tablet 500 mg  500 mg Oral Q8H Phillips Grout, MD   500 mg at 06/18/16 0514  . vitamin B-12 (CYANOCOBALAMIN) tablet 100 mcg  100 mcg Oral Daily Phillips Grout, MD   100 mcg at 06/17/16 1014     Discharge Medications: Please see discharge summary for a list of discharge medications.  Relevant Imaging Results:  Relevant Lab Results:   Additional Information [id number]  Lia Hopping, LCSW

## 2016-06-18 NOTE — Progress Notes (Addendum)
Physical Therapy Treatment Patient Details Name: [name] MRN: [id number] DOB: 25-Feb-1924 Today's Date: 06/18/2016    History of Present Illness 82 yo female admitted with dehydration. Recent diagnosis of shingles. Hx of CHF,CKD, COPD, orthostatic hypotension, gait instability    PT Comments    Assisted OOB to amb artound her twice with one sitting rest break.  Unsteady gait and limited activity tolerance.   Follow Up Recommendations  HH at ALF if they are able to provide appropriate level of care Otherwise SNF     Equipment Recommendations       Recommendations for Other Services       Precautions / Restrictions Precautions Precautions: Fall Precaution Comments: airborne (shingles) Restrictions Weight Bearing Restrictions: No    Mobility  Bed Mobility Overal bed mobility: Needs Assistance Bed Mobility: Supine to Sit;Sit to Supine     Supine to sit: Min assist Sit to supine: Mod assist   General bed mobility comments: more assist needed back to bed  Transfers Overall transfer level: Needs assistance Equipment used: 1 person hand held assist (no walker in room) Transfers: Sit to/from Stand Sit to Stand: Mod assist         General transfer comment: Assist to rise, stabilize, control descent. VCs safety, hand placement  Ambulation/Gait Ambulation/Gait assistance: Min assist;Mod assist Ambulation Distance (Feet): 14 Feet Assistive device: None;1 person hand held assist Gait Pattern/deviations: Step-to pattern;Step-through pattern Gait velocity: decreased   General Gait Details: no walker in room so amb hand held assist around room twice with one sitting rest break.  Very unsteady and mod c/o weakness.     Stairs            Wheelchair Mobility    Modified Rankin (Stroke Patients Only)       Balance                                    Cognition Arousal/Alertness: Awake/alert Behavior During Therapy: WFL for tasks  assessed/performed Overall Cognitive Status: Within Functional Limits for tasks assessed                      Exercises      General Comments        Pertinent Vitals/Pain Pain Assessment: Faces Faces Pain Scale: Hurts a little bit Pain Location: back Pain Descriptors / Indicators: Discomfort Pain Intervention(s): Monitored during session    Home Living                      Prior Function            PT Goals (current goals can now be found in the care plan section) Progress towards PT goals: Progressing toward goals    Frequency    Min 3X/week      PT Plan Current plan remains appropriate    Co-evaluation             End of Session Equipment Utilized During Treatment: Gait belt Activity Tolerance: Patient limited by fatigue Patient left: in bed;with call bell/phone within reach;with bed alarm set     Time: 1355-1416 PT Time Calculation (min) (ACUTE ONLY): 21 min  Charges:  $Gait Training: 8-22 mins                    G Codes:      Rica Koyanagi  PTA [facility]  Acute  Rehab Pager      [phone]

## 2016-06-19 LAB — BASIC METABOLIC PANEL
ANION GAP: 7 (ref 5–15)
BUN: 13 mg/dL (ref 6–20)
CO2: 26 mmol/L (ref 22–32)
Calcium: 8.6 mg/dL — ABNORMAL LOW (ref 8.9–10.3)
Chloride: 105 mmol/L (ref 101–111)
Creatinine, Ser: 0.59 mg/dL (ref 0.44–1.00)
GFR calc Af Amer: >60 mL/min (ref >60)
GLUCOSE: 95 mg/dL (ref 65–99)
POTASSIUM: 3.9 mmol/L (ref 3.5–5.1)
Sodium: 138 mmol/L (ref 135–145)

## 2016-06-19 LAB — CBC
HEMATOCRIT: 31.7 % — AB (ref 36.0–46.0)
HEMOGLOBIN: 10.4 g/dL — AB (ref 12.0–15.0)
MCH: 32.7 pg (ref 26.0–34.0)
MCHC: 32.8 g/dL (ref 30.0–36.0)
MCV: 99.7 fL (ref 78.0–100.0)
PLATELETS: 274 10*3/uL (ref 150–400)
RBC: 3.18 MIL/uL — AB (ref 3.87–5.11)
RDW: 12.7 % (ref 11.5–15.5)
WBC: 8.5 10*3/uL (ref 4.0–10.5)

## 2016-06-19 MED ORDER — TRAMADOL HCL 50 MG PO TABS
100.0000 mg | ORAL_TABLET | Freq: Four times a day (QID) | ORAL | Status: DC | PRN
  Administered 2016-06-19: 100 mg via ORAL
  Filled 2016-06-19: qty 2

## 2016-06-19 NOTE — Progress Notes (Signed)
PROGRESS NOTE    [name]  [id number] DOB: 02/02/1924 DOA: 06/15/2016 PCP: Willodean Rosenthal, MD    Brief Narrative: [name] is a 82 y.o. female with medical history significant of CHF, CKD recent shingles on her face started on valtrex , seen by an opthalmology on 10/9 , presented to ED for unable to walk. She was found to be dehydrated.   Assessment & Plan:   Principal Problem:   Dehydration Active Problems:   Hypothyroidism   COPD (chronic obstructive pulmonary disease) (HCC)   Atrial fibrillation (HCC)   Orthostatic hypotension   Gait instability   Shingles  Dehydration; Improving. On IV fluids. PT eval recommended SNF vs Home health at ALF. Will get SW evaluation.   Unable to ambulate: from generalized weakness and deconditioning. PT eval recommended SNF VS hh at ALF.   Hypertension:  Sub optimal.  Better controlled TODAY. Reports her pain is driving the blood pressure high.   Shingles:  On valtrex and eye drops.  The crusts over the trigeminal area appear slightly worsened, changed to IV acyclovir.  Resume the eye drops.  Will increase the dose of tramadol for pain control.    Hypothyroidism: Resume synthroid.   COPD: No wheezing heard. Stable and compensated.  duonebs as needed.  CXR shows copd without acute abnormality.    Orthostatic Hypotension:  Repeat vital signs negative.    Atrial fibrillation; Rate controlled. On aspirin.   Hyponatremia:  Resolved with hydration.   Rheumatoid arthritis: resume home meds.     DVT prophylaxis: (SCD'S) Code Status: (DNR) Family Communication: none at bedside, discussed with daughter on 10/14 Disposition Plan: SNF vs ALF with home health needs.    Consultants:    none  Procedures: Home   Antimicrobials: valtrex   Subjective: Wants pain medication and wants [place] to order diet .   Objective: Vitals:   06/18/16 0518 06/18/16 1340 06/18/16 2153 06/19/16 0605  BP: (!) 147/62 (!)  142/68 (!) 144/45 (!) 157/33  Pulse: 61 70 64 (!) 58  Resp: 18 18 17 15   Temp: 97.9 F (36.6 C) 98 F (36.7 C) 98.1 F (36.7 C) 98 F (36.7 C)  TempSrc: Oral Oral Oral Oral  SpO2: 95% 96% 97%   Weight:      Height:        Intake/Output Summary (Last 24 hours) at 06/19/16 1605 Last data filed at 06/19/16 1030  Gross per 24 hour  Intake           2015.8 ml  Output             1500 ml  Net            515.8 ml   Filed Weights   06/15/16 2031  Weight: 52.2 kg (115 lb 1.3 oz)    Examination:  General exam: Appears calm and comfortable , crusts over the left side of the forehead.  Respiratory system: Clear to auscultation. Respiratory effort normal. Cardiovascular system: S1 & S2 heard, RRR. No JVD, murmurs, rubs, gallops or clicks. No pedal edema. Gastrointestinal system: Abdomen is nondistended, soft and nontender. No organomegaly or masses felt. Normal bowel sounds heard. Central nervous system: Alert and oriented. No focal neurological deficits. Extremities: Symmetric 5 x 5 power. Skin: No rashes, lesions or ulcers Psychiatry: Judgement and insight appear normal. Mood & affect appropriate.     Data Reviewed: I have personally reviewed following labs and imaging studies  CBC:  Recent Labs Lab 06/15/16 1600 06/19/16  0444  WBC 10.2 8.5  NEUTROABS 7.0  --   HGB 13.3 10.4*  HCT 39.1 31.7*  MCV 96.5 99.7  PLT 326 [id number]   Basic Metabolic Panel:  Recent Labs Lab 06/15/16 1600 06/16/16 0406 06/19/16 0444  NA 133* 133* 138  K 4.3 3.9 3.9  CL 97* 99* 105  CO2 25 23 26   GLUCOSE 90 95 95  BUN 21* 18 13  CREATININE 0.99 0.80 0.59  CALCIUM 9.3 8.9 8.6*   GFR: Estimated Creatinine Clearance: 38.5 mL/min (by C-G formula based on SCr of 0.59 mg/dL). Liver Function Tests:  Recent Labs Lab 06/15/16 1600  AST 30  ALT 20  ALKPHOS 64  BILITOT 0.3  PROT 7.2  ALBUMIN 4.2   No results for input(s): LIPASE, AMYLASE in the last 168 hours. No results for input(s):  AMMONIA in the last 168 hours. Coagulation Profile: No results for input(s): INR, PROTIME in the last 168 hours. Cardiac Enzymes: No results for input(s): CKTOTAL, CKMB, CKMBINDEX, TROPONINI in the last 168 hours. BNP (last 3 results) No results for input(s): PROBNP in the last 8760 hours. HbA1C: No results for input(s): HGBA1C in the last 72 hours. CBG: No results for input(s): GLUCAP in the last 168 hours. Lipid Profile: No results for input(s): CHOL, HDL, LDLCALC, TRIG, CHOLHDL, LDLDIRECT in the last 72 hours. Thyroid Function Tests: No results for input(s): TSH, T4TOTAL, FREET4, T3FREE, THYROIDAB in the last 72 hours. Anemia Panel: No results for input(s): VITAMINB12, FOLATE, FERRITIN, TIBC, IRON, RETICCTPCT in the last 72 hours. Sepsis Labs: No results for input(s): PROCALCITON, LATICACIDVEN in the last 168 hours.  No results found for this or any previous visit (from the past 240 hour(s)).       Radiology Studies: No results found.      Scheduled Meds: . acetaminophen  1,000 mg Oral QPM  . acyclovir  10 mg/kg Intravenous Q12H  . aspirin  81 mg Oral Daily  . calcium carbonate  1 tablet Oral Daily  . cholecalciferol  2,000 Units Oral Daily  . erythromycin  1 application Left Eye BID  . folic acid  1 mg Oral Daily  . gabapentin  100 mg Oral BID  . hydroxychloroquine  200 mg Oral QHS  . levothyroxine  50 mcg Oral QAC breakfast  . magnesium gluconate  500 mg Oral BID  . morphine  15 mg Oral Daily  . pantoprazole  40 mg Oral QAC breakfast  . polyvinyl alcohol  1 drop Left Eye Q2H  . predniSONE  5 mg Oral Q breakfast  . senna-docusate  2 tablet Oral QHS  . cyanocobalamin  100 mcg Oral Daily   Continuous Infusions: . sodium chloride 100 mL/hr at 06/19/16 1047     LOS: 3 days    Time spent: 30 minutes    Derwin Reddy, MD [facility] Pager [phone]  If 7PM-7AM, please contact night-coverage [email or web address] Password [email or web address] 06/19/2016, 4:05 PM

## 2016-06-20 MED ORDER — VALACYCLOVIR HCL 500 MG PO TABS: 500.0000 mg | ORAL_TABLET | Freq: Three times a day (TID) | ORAL | 0 refills | Status: AC

## 2016-06-20 NOTE — Discharge Summary (Signed)
Physician Discharge Summary  [name] [id number] DOB: December 18, 1923 DOA: 06/15/2016  PCP: Willodean Rosenthal, MD  Admit date: 06/15/2016 Discharge date: 06/20/2016  Admitted From: ALF. Disposition: ALF  Recommendations for Outpatient Follow-up:  1. Follow up with PCP in 1-2 weeks 2. Please obtain BMP/CBC in one week 3. Please follow up on the following pending results:  Home Health yes  Discharge Condition:GUARDED.  CODE STATUS:DNR Diet recommendation: REGULAR DIET.   Brief/Interim Summary: [name] a 82 y.o.femalewith medical history significant of CHF, CKD recent shingles on her face started on valtrex , seen by an opthalmology on 10/9 , presented to ED for unable to walk. She was found to be dehydrated.   Discharge Diagnoses:  Principal Problem:   Dehydration Active Problems:   Hypothyroidism   COPD (chronic obstructive pulmonary disease) (HCC)   Atrial fibrillation (HCC)   Orthostatic hypotension   Gait instability   Shingles  Dehydration; improved. PT eval recommended SNF vs Home health at ALF.    Unable to ambulate: from generalized weakness and deconditioning. PT eval recommended SNF VS hh at ALF.   Hypertension:  Sub optimal.  Repeat bp ordered.   Shingles:  On valtrex and eye drops.  CONTINUE the valtrex to complete the course.     Hypothyroidism: Resume synthroid.   COPD: No wheezing heard. Stable and compensated.  duonebs as needed.  CXR shows copd without acute abnormality.    Orthostatic Hypotension:  Repeat vital signs negative.    Atrial fibrillation; Rate controlled. On aspirin.   Hyponatremia:  Resolved with hydration.   Rheumatoid arthritis: resume home meds.    Discharge Instructions  Discharge Instructions    Diet general    Complete by:  As directed    Discharge instructions    Complete by:  As directed    Please follow up with PCP in one week.  Please follow up with your ophthalmologist  as recommended.       Medication List    TAKE these medications   acetaminophen 325 MG tablet Commonly known as:  TYLENOL Take 650 mg by mouth every 4 (four) hours as needed for mild pain, moderate pain, fever or headache. What changed:  Another medication with the same name was removed. Continue taking this medication, and follow the directions you see here.   aspirin 81 MG chewable tablet Chew 81 mg by mouth daily.   bisacodyl 10 MG suppository Commonly known as:  DULCOLAX Place 10 mg rectally as needed for moderate constipation.   calcium carbonate 500 MG chewable tablet Commonly known as:  TUMS - dosed in mg elemental calcium Chew 1 tablet by mouth daily.   cholecalciferol 1000 units tablet Commonly known as:  VITAMIN D Take 2,000 Units by mouth daily.   cyanocobalamin 100 MCG tablet Take 100 mcg by mouth daily.   diclofenac sodium 1 % Gel Commonly known as:  VOLTAREN Apply 2 g topically every 6 (six) hours as needed (for pain).   erythromycin ophthalmic ointment Place 1 application into the left eye 2 (two) times daily.   folic acid 1 MG tablet Commonly known as:  FOLVITE Take 1 mg by mouth daily.   gabapentin 100 MG capsule Commonly known as:  NEURONTIN Take 100 mg by mouth 2 (two) times daily.   hydroxychloroquine 200 MG tablet Commonly known as:  PLAQUENIL Take 200 mg by mouth at bedtime.   hydroxypropyl methylcellulose / hypromellose 2.5 % ophthalmic solution Commonly known as:  ISOPTO TEARS / [place]  1 drop into the left eye every 2 (two) hours.   levothyroxine 50 MCG tablet Commonly known as:  SYNTHROID, LEVOTHROID Take 50 mcg by mouth daily before breakfast.   loperamide 2 MG capsule Commonly known as:  IMODIUM Take 2 mg by mouth as needed for diarrhea or loose stools.   magic mouthwash Soln Take 10 mLs by mouth 2 (two) times daily as needed for mouth pain. Pt is to swish and spit.   magnesium gluconate 500 MG tablet Commonly known  as:  MAGONATE Take 500 mg by mouth 2 (two) times daily.   magnesium hydroxide 400 MG/5ML suspension Commonly known as:  MILK OF MAGNESIA Take 30 mLs by mouth daily as needed for mild constipation or moderate constipation.   meclizine 12.5 MG tablet Commonly known as:  ANTIVERT Take 12.5 mg by mouth 3 (three) times daily as needed for dizziness.   morphine 15 MG 12 hr tablet Commonly known as:  MS CONTIN Take 15 mg by mouth daily.   oxyCODONE 5 MG immediate release tablet Commonly known as:  Oxy IR/ROXICODONE Take 5 mg by mouth every 4 (four) hours as needed for severe pain.   pantoprazole 40 MG tablet Commonly known as:  PROTONIX Take 40 mg by mouth daily before breakfast.   polyethylene glycol packet Commonly known as:  MIRALAX / GLYCOLAX Take 17 g by mouth daily as needed for mild constipation or moderate constipation.   predniSONE 5 MG tablet Commonly known as:  DELTASONE Take 5 mg by mouth daily with breakfast.   promethazine 25 MG tablet Commonly known as:  PHENERGAN Take 25 mg by mouth every 4 (four) hours as needed for nausea or vomiting.   senna-docusate 8.6-50 MG tablet Commonly known as:  Senokot-S Take 2 tablets by mouth at bedtime. Pt is able to take an additional tablet during the day if needed.   traMADol 50 MG tablet Commonly known as:  ULTRAM Take 50 mg by mouth every 6 (six) hours as needed for moderate pain.   valACYclovir 500 MG tablet Commonly known as:  VALTREX Take 1 tablet (500 mg total) by mouth every 8 (eight) hours.      Follow-up Information    POWELL, JERRY, MD. Schedule an appointment as soon as possible for a visit in 1 week(s).   Specialty:  Internal Medicine Contact information: [place] [phone]          Allergies  Allergen Reactions  . Lactose Intolerance (Gi) Other (See Comments)    Reaction:  GI upset     Consultations:  none   Procedures/Studies: Dg Chest 2 View  Result  Date: 06/15/2016 CLINICAL DATA:  Recent diagnosis of shingles with weakness and pain EXAM: CHEST  2 VIEW COMPARISON:  10/13/2012 FINDINGS: Cardiac shadow is at the upper limits of normal in size. The lungs are hyper aerated bilaterally. No focal infiltrate or sizable effusion is seen. Changes of prior vertebral augmentation are seen. Chronic sclerotic focus in the proximal left humerus is noted. IMPRESSION: COPD without acute abnormality. Electronically Signed   By: Inez Catalina M.D.   On: 06/15/2016 15:34       Subjective: No complaints.   Discharge Exam: Vitals:   06/19/16 2234 06/20/16 0559  BP: (!) 162/73 (!) 170/94  Pulse: (!) 51 78  Resp: 15 17  Temp: 97.7 F (36.5 C) 98.4 F (36.9 C)   Vitals:   06/19/16 0605 06/19/16 1436 06/19/16 2234 06/20/16 0559  BP: [name])  157/33 (!) 141/78 (!) 162/73 (!) 170/94  Pulse: (!) 58 64 (!) 51 78  Resp: 15 16 15 17   Temp: 98 F (36.7 C) 98.1 F (36.7 C) 97.7 F (36.5 C) 98.4 F (36.9 C)  TempSrc: Oral Oral Oral Oral  SpO2:  97% 97% 97%  Weight:      Height:        General: Pt is alert, awake, not in acute distress Cardiovascular: RRR, S1/S2 +, no rubs, no gallops Respiratory: CTA bilaterally, no wheezing, no rhonchi Abdominal: Soft, NT, ND, bowel sounds + Extremities: no edema, no cyanosis    The results of significant diagnostics from this hospitalization (including imaging, microbiology, ancillary and laboratory) are listed below for reference.     Microbiology: No results found for this or any previous visit (from the past 240 hour(s)).   Labs: BNP (last 3 results) No results for input(s): BNP in the last 8760 hours. Basic Metabolic Panel:  Recent Labs Lab 06/15/16 1600 06/16/16 0406 06/19/16 0444  NA 133* 133* 138  K 4.3 3.9 3.9  CL 97* 99* 105  CO2 25 23 26   GLUCOSE 90 95 95  BUN 21* 18 13  CREATININE 0.99 0.80 0.59  CALCIUM 9.3 8.9 8.6*   Liver Function Tests:  Recent Labs Lab 06/15/16 1600  AST 30   ALT 20  ALKPHOS 64  BILITOT 0.3  PROT 7.2  ALBUMIN 4.2   No results for input(s): LIPASE, AMYLASE in the last 168 hours. No results for input(s): AMMONIA in the last 168 hours. CBC:  Recent Labs Lab 06/15/16 1600 06/19/16 0444  WBC 10.2 8.5  NEUTROABS 7.0  --   HGB 13.3 10.4*  HCT 39.1 31.7*  MCV 96.5 99.7  PLT 326 274   Cardiac Enzymes: No results for input(s): CKTOTAL, CKMB, CKMBINDEX, TROPONINI in the last 168 hours. BNP: Invalid input(s): POCBNP CBG: No results for input(s): GLUCAP in the last 168 hours. D-Dimer No results for input(s): DDIMER in the last 72 hours. Hgb A1c No results for input(s): HGBA1C in the last 72 hours. Lipid Profile No results for input(s): CHOL, HDL, LDLCALC, TRIG, CHOLHDL, LDLDIRECT in the last 72 hours. Thyroid function studies No results for input(s): TSH, T4TOTAL, T3FREE, THYROIDAB in the last 72 hours.  Invalid input(s): FREET3 Anemia work up No results for input(s): VITAMINB12, FOLATE, FERRITIN, TIBC, IRON, RETICCTPCT in the last 72 hours. Urinalysis    Component Value Date/Time   COLORURINE YELLOW 10/13/2015 1223   APPEARANCEUR CLEAR 10/13/2015 1223   LABSPEC 1.015 10/13/2015 1223   PHURINE 6.5 10/13/2015 1223   GLUCOSEU NEGATIVE 10/13/2015 1223   HGBUR NEGATIVE 10/13/2015 1223   HGBUR trace-intact 05/20/2009 1013   BILIRUBINUR NEGATIVE 10/13/2015 1223   BILIRUBINUR neg 04/08/2011 1455   KETONESUR NEGATIVE 10/13/2015 1223   PROTEINUR NEGATIVE 10/13/2015 1223   UROBILINOGEN 0.2 11/20/2012 1218   NITRITE NEGATIVE 10/13/2015 1223   LEUKOCYTESUR NEGATIVE 10/13/2015 1223   Sepsis Labs Invalid input(s): PROCALCITONIN,  WBC,  LACTICIDVEN Microbiology No results found for this or any previous visit (from the past 240 hour(s)).   Time coordinating discharge: Over 30 minutes  SIGNED:   Hosie Poisson, MD  [facility] 06/20/2016, 12:20 PM Pager   If 7PM-7AM, please contact night-coverage [email or web address] Password  [email or web address]

## 2016-06-20 NOTE — Clinical Social Work Note (Signed)
Pt is ready for discharge today and will return to [facility]. Pt's daughter is aware and agreeable to discharge plan. Facility is able to accept pt as they have received discharge information. [facility]AR provide transportation to facility. CSW is singing off as no further needs identified.   Darden Dates, MSW, LCSW Clinical Social Worker  [phone]

## 2016-06-20 NOTE — NC FL2 (Signed)
[place] LEVEL OF CARE SCREENING TOOL     IDENTIFICATION  Patient Name: [name] Birthdate: Jan 16, 1924 Sex: female Admission Date (Current Location): 06/15/2016  [facility] and [place] Number:  Herbalist and Address:  [facility], [place] [address] [place], [place] [address]      Provider Number: [id number]  Attending Physician Name and Address:  Hosie Poisson, MD  Relative Name and Phone Number:  [name] - daughter. Phone number [phone].    Current Level of Care: Hospital Recommended Level of Care: [place] Prior Approval Number:    Date Approved/Denied:   PASRR Number:    Discharge Plan: Domiciliary (Rest home)    Current Diagnoses: Patient Active Problem List   Diagnosis Date Noted  . Shingles 06/15/2016  . Aspiration into airway   . Pneumonia 10/13/2015  . Aspiration pneumonia ([place]) 10/13/2015  . Sepsis ([place])   . Encephalopathy   . Thyroid activity decreased   . Long term current use of systemic steroids   . Chronic obstructive pulmonary disease ([place])   . Syncope and collapse 11/20/2012  . Hypotension 11/20/2012  . Nausea and vomiting 11/20/2012  . Urinary retention 11/20/2012  . Dysuria R/O UTI 11/20/2012  . Anemia 11/20/2012  . GERD (gastroesophageal reflux disease) 11/20/2012  . Dehydration 01/06/2012  . Gastroenteritis 01/06/2012  . Gait instability 01/06/2012  . Arthritis   . Mitral valve prolapse   . Hiatal hernia   . Diverticulitis   . COPD (chronic obstructive pulmonary disease) ([place])   . Thyroid disease   . Atrial fibrillation ([place])   . Orthostatic hypotension   . Vertigo   . Osteoporosis   . Diverticulosis   . Syncope 08/12/2011  . CARCINOMA, BASAL CELL 06/11/2010  . Rheumatoid arthritis(714.0) 05/22/2009  . Hypothyroidism 05/20/2009  . ABDOMINAL PAIN 05/17/2009    Orientation RESPIRATION BLADDER Height & Weight     Self, Time, Situation, Place  Normal  Continent Weight: 115 lb 1.3 oz (52.2 kg) Height:  5\' 6"  (167.6 cm)  BEHAVIORAL SYMPTOMS/MOOD NEUROLOGICAL BOWEL NUTRITION STATUS      Continent Diet (Heart Healthy, Thin Liquids)  AMBULATORY STATUS COMMUNICATION OF NEEDS Skin   Limited Assist Verbally Normal                       Personal Care Assistance Level of Assistance  Bathing, Feeding, Dressing Bathing Assistance: Limited assistance Feeding assistance: Independent Dressing Assistance: Limited assistance     Functional Limitations Info  Sight, Hearing, Speech Sight Info: Adequate Hearing Info: Adequate Speech Info: Adequate    SPECIAL CARE FACTORS FREQUENCY  PT (By licensed PT), OT (By licensed OT)     PT Frequency: 5              Contractures Contractures Info: Not present    Additional Factors Info  Code Status, Allergies, Isolation Precautions Code Status Info: DNR Allergies Info: Lactose Intolerance (GI)     Isolation Precautions Info: Air/Con Pre     Discharge Medications: Please see discharge summary for a list of discharge medications. acetaminophen 325 MG tablet Commonly known as:  TYLENOL Take 650 mg by mouth every 4 (four) hours as needed for mild pain, moderate pain, fever or headache. What changed:  Another medication with the same name was removed. Continue taking this medication, and follow the directions you see here.  aspirin 81 MG chewable tablet Chew 81 mg by mouth daily.  bisacodyl 10  MG suppository Commonly known as:  DULCOLAX Place 10 mg rectally as needed for moderate constipation.  calcium carbonate 500 MG chewable tablet Commonly known as:  TUMS - dosed in mg elemental calcium Chew 1 tablet by mouth daily.  cholecalciferol 1000 units tablet Commonly known as:  VITAMIN D Take 2,000 Units by mouth daily.  cyanocobalamin 100 MCG tablet Take 100 mcg by mouth daily.  diclofenac sodium 1 % Gel Commonly known as:  VOLTAREN Apply 2 g topically every 6 (six) hours as needed  (for pain).  erythromycin ophthalmic ointment Place 1 application into the left eye 2 (two) times daily.  folic acid 1 MG tablet Commonly known as:  FOLVITE Take 1 mg by mouth daily.  gabapentin 100 MG capsule Commonly known as:  NEURONTIN Take 100 mg by mouth 2 (two) times daily.  hydroxychloroquine 200 MG tablet Commonly known as:  PLAQUENIL Take 200 mg by mouth at bedtime.  hydroxypropyl methylcellulose / hypromellose 2.5 % ophthalmic solution Commonly known as:  ISOPTO TEARS / GONIOVISC Place 1 drop into the left eye every 2 (two) hours.  levothyroxine 50 MCG tablet Commonly known as:  SYNTHROID, LEVOTHROID Take 50 mcg by mouth daily before breakfast.  loperamide 2 MG capsule Commonly known as:  IMODIUM Take 2 mg by mouth as needed for diarrhea or loose stools.  magic mouthwash Soln Take 10 mLs by mouth 2 (two) times daily as needed for mouth pain. Pt is to swish and spit.  magnesium gluconate 500 MG tablet Commonly known as:  MAGONATE Take 500 mg by mouth 2 (two) times daily.  magnesium hydroxide 400 MG/5ML suspension Commonly known as:  MILK OF MAGNESIA Take 30 mLs by mouth daily as needed for mild constipation or moderate constipation.  meclizine 12.5 MG tablet Commonly known as:  ANTIVERT Take 12.5 mg by mouth 3 (three) times daily as needed for dizziness.  morphine 15 MG 12 hr tablet Commonly known as:  MS CONTIN Take 15 mg by mouth daily.  oxyCODONE 5 MG immediate release tablet Commonly known as:  Oxy IR/ROXICODONE Take 5 mg by mouth every 4 (four) hours as needed for severe pain.  pantoprazole 40 MG tablet Commonly known as:  PROTONIX Take 40 mg by mouth daily before breakfast.  polyethylene glycol packet Commonly known as:  MIRALAX / GLYCOLAX Take 17 g by mouth daily as needed for mild constipation or moderate constipation.  predniSONE 5 MG tablet Commonly known as:  DELTASONE Take 5 mg by mouth daily with breakfast.  promethazine 25 MG tablet Commonly  known as:  PHENERGAN Take 25 mg by mouth every 4 (four) hours as needed for nausea or vomiting.  senna-docusate 8.6-50 MG tablet Commonly known as:  Senokot-S Take 2 tablets by mouth at bedtime. Pt is able to take an additional tablet during the day if needed.  traMADol 50 MG tablet Commonly known as:  ULTRAM Take 50 mg by mouth every 6 (six) hours as needed for moderate pain.  valACYclovir 500 MG tablet Commonly known as:  VALTREX Take 1 tablet (500 mg total) by mouth every 8 (eight) hours.   Relevant Imaging Results:  Relevant Lab Results:   Additional Information SSN:  [id number]   Pt will need HHPT.   Darden Dates, LCSW

## 2016-06-20 NOTE — Care Management Important Message (Signed)
Important Message  Patient Details  Name: [name] MRN: [id number] Date of Birth: July 31, 1924   Medicare Important Message Given:  Yes    Erenest Rasher, RN 06/20/2016, 5:25 PM

## 2016-07-03 ENCOUNTER — Emergency Department (HOSPITAL_BASED_OUTPATIENT_CLINIC_OR_DEPARTMENT_OTHER)
Admission: EM | Admit: 2016-07-03 | Discharge: 2016-07-03 | Disposition: A | Discharge status: Home / Self Care | Payer: Medicare Other | Attending: Emergency Medicine | Admitting: Emergency Medicine

## 2016-07-03 ENCOUNTER — Encounter (HOSPITAL_BASED_OUTPATIENT_CLINIC_OR_DEPARTMENT_OTHER): Payer: Self-pay | Admitting: Emergency Medicine

## 2016-07-03 DIAGNOSIS — B0229 Other postherpetic nervous system involvement: Secondary | ICD-10-CM | POA: Insufficient documentation

## 2016-07-03 DIAGNOSIS — J449 Chronic obstructive pulmonary disease, unspecified: Secondary | ICD-10-CM | POA: Insufficient documentation

## 2016-07-03 DIAGNOSIS — E039 Hypothyroidism, unspecified: Secondary | ICD-10-CM | POA: Insufficient documentation

## 2016-07-03 DIAGNOSIS — Z87891 Personal history of nicotine dependence: Secondary | ICD-10-CM | POA: Insufficient documentation

## 2016-07-03 DIAGNOSIS — Z79899 Other long term (current) drug therapy: Secondary | ICD-10-CM | POA: Insufficient documentation

## 2016-07-03 DIAGNOSIS — Z7982 Long term (current) use of aspirin: Secondary | ICD-10-CM | POA: Diagnosis not present

## 2016-07-03 DIAGNOSIS — H5712 Ocular pain, left eye: Secondary | ICD-10-CM | POA: Diagnosis present

## 2016-07-03 LAB — BASIC METABOLIC PANEL
ANION GAP: 10 (ref 5–15)
BUN: 16 mg/dL (ref 6–20)
CHLORIDE: 97 mmol/L — AB (ref 101–111)
CO2: 28 mmol/L (ref 22–32)
Calcium: 9.6 mg/dL (ref 8.9–10.3)
Creatinine, Ser: 0.94 mg/dL (ref 0.44–1.00)
GFR calc non Af Amer: 52 mL/min — ABNORMAL LOW (ref >60)
GLUCOSE: 139 mg/dL — AB (ref 65–99)
POTASSIUM: 4.5 mmol/L (ref 3.5–5.1)
Sodium: 135 mmol/L (ref 135–145)

## 2016-07-03 LAB — CBC WITH DIFFERENTIAL/PLATELET
BASOS ABS: 0 10*3/uL (ref 0.0–0.1)
Basophils Relative: 0 %
Eosinophils Absolute: 0.1 10*3/uL (ref 0.0–0.7)
Eosinophils Relative: 1 %
HEMATOCRIT: 42.6 % (ref 36.0–46.0)
HEMOGLOBIN: 13.9 g/dL (ref 12.0–15.0)
LYMPHS PCT: 18 %
Lymphs Abs: 2.4 10*3/uL (ref 0.7–4.0)
MCH: 33.3 pg (ref 26.0–34.0)
MCHC: 32.6 g/dL (ref 30.0–36.0)
MCV: 101.9 fL — AB (ref 78.0–100.0)
MONO ABS: 0.9 10*3/uL (ref 0.1–1.0)
Monocytes Relative: 7 %
NEUTROS ABS: 10.1 10*3/uL — AB (ref 1.7–7.7)
NEUTROS PCT: 74 %
Platelets: 383 10*3/uL (ref 150–400)
RBC: 4.18 MIL/uL (ref 3.87–5.11)
RDW: 13.1 % (ref 11.5–15.5)
WBC: 13.5 10*3/uL — ABNORMAL HIGH (ref 4.0–10.5)

## 2016-07-03 MED ORDER — FLUORESCEIN SODIUM 1 MG OP STRP
1.0000 | ORAL_STRIP | Freq: Once | OPHTHALMIC | Status: AC
  Administered 2016-07-03: 1 via OPHTHALMIC
  Filled 2016-07-03: qty 1

## 2016-07-03 MED ORDER — CYCLOPENTOLATE HCL 1 % OP SOLN
2.0000 [drp] | Freq: Once | OPHTHALMIC | Status: AC
  Administered 2016-07-03: 2 [drp] via OPHTHALMIC

## 2016-07-03 MED ORDER — HOMATROPINE HBR 2 % OP SOLN
2.0000 [drp] | Freq: Once | OPHTHALMIC | Status: DC
  Filled 2016-07-03: qty 5

## 2016-07-03 MED ORDER — HYPROMELLOSE (GONIOSCOPIC) 2.5 % OP SOLN: 1.0000 [drp] | Freq: Four times a day (QID) | OPHTHALMIC | 12 refills | Status: AC

## 2016-07-03 MED ORDER — CYCLOPENTOLATE HCL 1 % OP SOLN: 2.0000 [drp] | Freq: Once | OPHTHALMIC | Status: DC

## 2016-07-03 MED ORDER — GABAPENTIN 100 MG PO CAPS
100.0000 mg | ORAL_CAPSULE | Freq: Once | ORAL | Status: AC
  Administered 2016-07-03: 100 mg via ORAL
  Filled 2016-07-03: qty 1

## 2016-07-03 MED ORDER — TETRACAINE HCL 0.5 % OP SOLN
2.0000 [drp] | Freq: Once | OPHTHALMIC | Status: AC
  Administered 2016-07-03: 2 [drp] via OPHTHALMIC
  Filled 2016-07-03: qty 4

## 2016-07-03 MED ORDER — ERYTHROMYCIN 5 MG/GM OP OINT: 1.0000 "application " | TOPICAL_OINTMENT | Freq: Two times a day (BID) | OPHTHALMIC | 0 refills | Status: AC

## 2016-07-03 MED ORDER — GABAPENTIN 100 MG PO CAPS: 100.0000 mg | ORAL_CAPSULE | Freq: Three times a day (TID) | ORAL | 0 refills | Status: AC

## 2016-07-03 MED ORDER — ATROPINE SULFATE 1 % OP SOLN
2.0000 [drp] | Freq: Once | OPHTHALMIC | Status: DC
  Filled 2016-07-03: qty 2

## 2016-07-03 MED ORDER — VALACYCLOVIR HCL 1 G PO TABS: 1000.0000 mg | ORAL_TABLET | Freq: Three times a day (TID) | ORAL | 0 refills | Status: AC

## 2016-07-03 MED ORDER — CYCLOPENTOLATE HCL 1 % OP SOLN
OPHTHALMIC | Status: AC
  Filled 2016-07-03: qty 2

## 2016-07-03 MED ORDER — OXYCODONE HCL 5 MG PO TABS
5.0000 mg | ORAL_TABLET | Freq: Once | ORAL | Status: AC
  Administered 2016-07-03: 5 mg via ORAL
  Filled 2016-07-03: qty 1

## 2016-07-03 NOTE — ED Provider Notes (Signed)
[facility] DEPT [facility] Provider Note   CSN: [id number] Arrival date & time: 07/03/16  1435  By signing my name below, I, [name], attest that this documentation has been prepared under the direction and in the presence of Duffy Bruce, MD. Electronically Signed: Gwenlyn Neal, ED Scribe. 07/03/16. 3:14 PM.  History   Chief Complaint Chief Complaint  Patient presents with  . Eye Pain   The history is provided by the patient. No language interpreter was used.   HPI Comments: [name] is a 82 y.o. female with PMHx of shingles who presents to the Emergency Department complaining of sudden onset, 10/10 stabbing left eye pain onset PTA. Pt had the sensation of someone sticking a knife into her left eye which only lasted a few seconds, but she states the pain has lingered. She states her left eye feels blurry and like it had water in it. Pt sates she has hx of left eye problems. She reports difficulty seeing out of left eye before shingles infection 2 years ago. Pt was recently treated for a shingles infection in her left eye and has finished her course of antibiotics. Denies headache, difficulty speaking, difficulty swallowing, weakness, fever.   Past Medical History:  Diagnosis Date  . Atrial fibrillation ([place])   . Carcinoma ([place])    "top of left leg, had it taken off; right cheek, there now" (11/20/2012)  . Chronic lower back pain    "from a ruptured lower disc" (11/20/2012)  . COPD (chronic obstructive pulmonary disease) ([place])    "not bothered w/it anymore" (11/20/2012)  . Diverticulitis   . Diverticulosis   . GERD (gastroesophageal reflux disease)   . Hiatal hernia   . Hypothyroidism   . Mitral valve prolapse   . Orthostatic hypotension   . Osteoarthritis   . Osteoporosis   . Rheumatoid arthritis(714.0)   . Shingles   . Vertigo     Patient Active Problem List   Diagnosis Date Noted  . Shingles 06/15/2016  . Aspiration into airway   . Pneumonia 10/13/2015  .  Aspiration pneumonia ([place]) 10/13/2015  . Sepsis ([place])   . Encephalopathy   . Thyroid activity decreased   . Long term current use of systemic steroids   . Chronic obstructive pulmonary disease ([place])   . Syncope and collapse 11/20/2012  . Hypotension 11/20/2012  . Nausea and vomiting 11/20/2012  . Urinary retention 11/20/2012  . Dysuria R/O UTI 11/20/2012  . Anemia 11/20/2012  . GERD (gastroesophageal reflux disease) 11/20/2012  . Dehydration 01/06/2012  . Gastroenteritis 01/06/2012  . Gait instability 01/06/2012  . Arthritis   . Mitral valve prolapse   . Hiatal hernia   . Diverticulitis   . COPD (chronic obstructive pulmonary disease) ([place])   . Thyroid disease   . Atrial fibrillation ([place])   . Orthostatic hypotension   . Vertigo   . Osteoporosis   . Diverticulosis   . Syncope 08/12/2011  . CARCINOMA, BASAL CELL 06/11/2010  . Rheumatoid arthritis(714.0) 05/22/2009  . Hypothyroidism 05/20/2009  . ABDOMINAL PAIN 05/17/2009    Past Surgical History:  Procedure Laterality Date  . BACK SURGERY    . FIXATION KYPHOPLASTY THORACIC SPINE  ~ 2011   "after falling and breaking 2 vertebra" (11/20/2012)  . SKIN CANCER EXCISION Left ~ 2007   "top of my lower leg" (11/20/2012)  . TENDON REPAIR Right ~ 2009   wrist (11/20/2012)  . TONSILLECTOMY  ~ 1933    OB History    No data  available       Home Medications    Prior to Admission medications   Medication Sig Start Date End Date Taking? Authorizing Provider  acetaminophen (TYLENOL) 325 MG tablet Take 650 mg by mouth every 4 (four) hours as needed for mild pain, moderate pain, fever or headache.    Yes Historical Provider, MD  aspirin 81 MG chewable tablet Chew 81 mg by mouth daily.   Yes Historical Provider, MD  bisacodyl (DULCOLAX) 10 MG suppository Place 10 mg rectally as needed for moderate constipation.   Yes Historical Provider, MD  calcium carbonate (TUMS - DOSED IN MG ELEMENTAL CALCIUM) 500 MG chewable tablet Chew 1  tablet by mouth daily.   Yes Historical Provider, MD  cholecalciferol (VITAMIN D) 1000 units tablet Take 2,000 Units by mouth daily.   Yes Historical Provider, MD  cyanocobalamin 100 MCG tablet Take 100 mcg by mouth daily.   Yes Historical Provider, MD  diclofenac sodium (VOLTAREN) 1 % GEL Apply 2 g topically every 6 (six) hours as needed (for pain).    Yes Historical Provider, MD  folic acid (FOLVITE) 1 MG tablet Take 1 mg by mouth daily.   Yes Historical Provider, MD  hydroxychloroquine (PLAQUENIL) 200 MG tablet Take 200 mg by mouth at bedtime.    Yes Historical Provider, MD  hydroxypropyl methylcellulose (ISOPTO TEARS) 2.5 % ophthalmic solution Place 1 drop into the left eye every 2 (two) hours.    Yes Historical Provider, MD  levothyroxine (SYNTHROID, LEVOTHROID) 50 MCG tablet Take 50 mcg by mouth daily before breakfast.    Yes Historical Provider, MD  loperamide (IMODIUM) 2 MG capsule Take 2 mg by mouth as needed for diarrhea or loose stools.   Yes Historical Provider, MD  LORazepam (ATIVAN) 0.5 MG tablet Take 0.5 mg by mouth every 8 (eight) hours.   Yes Historical Provider, MD  magic mouthwash SOLN Take 10 mLs by mouth 2 (two) times daily as needed for mouth pain. Pt is to swish and spit.   Yes Historical Provider, MD  magnesium gluconate (MAGONATE) 500 MG tablet Take 500 mg by mouth 2 (two) times daily.   Yes Historical Provider, MD  magnesium hydroxide (MILK OF MAGNESIA) 400 MG/5ML suspension Take 30 mLs by mouth daily as needed for mild constipation or moderate constipation.    Yes Historical Provider, MD  meclizine (ANTIVERT) 12.5 MG tablet Take 12.5 mg by mouth 3 (three) times daily as needed for dizziness.    Yes Historical Provider, MD  morphine (MS CONTIN) 15 MG 12 hr tablet Take 15 mg by mouth daily.   Yes Historical Provider, MD  oxyCODONE (OXY IR/ROXICODONE) 5 MG immediate release tablet Take 5 mg by mouth every 4 (four) hours as needed for severe pain.   Yes Historical Provider, MD    pantoprazole (PROTONIX) 40 MG tablet Take 40 mg by mouth daily before breakfast.    Yes Historical Provider, MD  polyethylene glycol (MIRALAX / GLYCOLAX) packet Take 17 g by mouth daily as needed for mild constipation or moderate constipation.   Yes Historical Provider, MD  predniSONE (DELTASONE) 5 MG tablet Take 5 mg by mouth daily with breakfast.   Yes Historical Provider, MD  promethazine (PHENERGAN) 25 MG tablet Take 25 mg by mouth every 4 (four) hours as needed for nausea or vomiting.   Yes Historical Provider, MD  senna (SENOKOT) 8.6 MG tablet Take 1 tablet by mouth daily.   Yes Historical Provider, MD  senna-docusate (SENOKOT-S) 8.6-50 MG per tablet  Take 2 tablets by mouth at bedtime. Pt is able to take an additional tablet during the day if needed.   Yes Historical Provider, MD  traMADol (ULTRAM) 50 MG tablet Take 50 mg by mouth every 6 (six) hours as needed for moderate pain.   Yes Historical Provider, MD  UNABLE TO FIND DNR   Yes Historical Provider, MD  erythromycin ophthalmic ointment Place 1 application into the left eye 2 (two) times daily. 07/03/16 07/10/16  Duffy Bruce, MD  gabapentin (NEURONTIN) 100 MG capsule Take 1 capsule (100 mg total) by mouth 3 (three) times daily. 07/03/16 07/13/16  Duffy Bruce, MD  hydroxypropyl methylcellulose / hypromellose (ISOPTO TEARS / GONIOVISC) 2.5 % ophthalmic solution Place 1 drop into the left eye 4 (four) times daily. 07/03/16   Duffy Bruce, MD  valACYclovir (VALTREX) 1000 MG tablet Take 1 tablet (1,000 mg total) by mouth 3 (three) times daily. 07/03/16 07/17/16  Duffy Bruce, MD   Family History Family History  Problem Relation Age of Onset  . Cancer Mother     bladder  . Hyperlipidemia Sister   . Stroke Maternal Aunt     Social History Social History  Substance Use Topics  . Smoking status: Former Smoker    Packs/day: 0.12    Years: 40.00    Types: Cigarettes    Quit date: 09/06/1985  . Smokeless tobacco: Never Used      Comment: 11/20/2012 "stopped smoking 27 yr ago"  . Alcohol use No    Allergies   Lactose intolerance (gi)   Review of Systems Review of Systems  Constitutional: Positive for fatigue. Negative for chills and fever.  HENT: Negative for congestion, rhinorrhea and sore throat.   Eyes: Positive for pain and visual disturbance.  Respiratory: Negative for cough, shortness of breath and wheezing.   Cardiovascular: Negative for chest pain and leg swelling.  Gastrointestinal: Negative for abdominal pain, diarrhea, nausea and vomiting.  Genitourinary: Negative for dysuria, flank pain, vaginal bleeding and vaginal discharge.  Musculoskeletal: Negative for neck pain.  Skin: Positive for rash.  Allergic/Immunologic: Negative for immunocompromised state.  Neurological: Negative for syncope, speech difficulty, weakness and headaches.  Hematological: Does not bruise/bleed easily.  All other systems reviewed and are negative.  Physical Exam Updated Vital Signs BP 136/84   Pulse 68   Temp 97.1 F (36.2 C) (Oral)   Resp 18   Ht 5\' 5"  (1.651 m)   Wt 115 lb (52.2 kg)   SpO2 95%   BMI 19.14 kg/m   Physical Exam  Constitutional: She appears well-developed and well-nourished. No distress.  HENT:  Head: Normocephalic.  Left tympanic membrane normal and non-erythematous. External auditory canal without vesicles or lesions. Patient has a healing, crusted, excoriated rash along the upper left aspect of her face.  Eyes: Pupils are equal, round, and reactive to light.  Mild left-sided conjunctival injection. On fluorescein staining under slit lamp, there does appear to be a superficial abrasion versus tiny ulceration of the temporal inferior cornea overlying the iris. No pooling of secretions or Seidel sign. On dilated pupillary exam, there is normal, well delineated optic disc. Retinal vessels appear normal. No hemorrhages. No pallor.  Neck: Normal range of motion. Neck supple.  Cardiovascular: Normal  rate, regular rhythm and normal heart sounds.  Exam reveals no friction rub.   No murmur heard. Pulmonary/Chest: Effort normal and breath sounds normal. No respiratory distress. She has no wheezes. She has no rales.  Abdominal: Soft. Bowel sounds are normal. She exhibits  no distension. There is no tenderness.  Musculoskeletal: She exhibits no edema.  Neurological: She has normal strength. She is not disoriented. No cranial nerve deficit or sensory deficit. Gait normal. GCS eye subscore is 4. GCS verbal subscore is 5. GCS motor subscore is 6.  Skin: Skin is warm. Capillary refill takes less than 2 seconds. No rash noted.  Nursing note and vitals reviewed.  ED Treatments / Results  DIAGNOSTIC STUDIES: Oxygen Saturation is 96% on RA, adequate by my interpretation.    COORDINATION OF CARE: 3:02 PM Discussed treatment plan with pt at bedside which includes slit lamp exam and pt agreed to plan.  Labs (all labs ordered are listed, but only abnormal results are displayed) Labs Reviewed  CBC WITH DIFFERENTIAL/PLATELET - Abnormal; Notable for the following:       Result Value   WBC 13.5 (*)    MCV 101.9 (*)    Neutro Abs 10.1 (*)    All other components within normal limits  BASIC METABOLIC PANEL - Abnormal; Notable for the following:    Chloride 97 (*)    Glucose, Bld 139 (*)    GFR calc non Af Amer 52 (*)    All other components within normal limits    EKG  EKG Interpretation None      Radiology No results found.  Procedures Procedures (including critical care time)  Medications Ordered in ED Medications  fluorescein ophthalmic strip 1 strip (1 strip Left Eye Given 07/03/16 1500)  tetracaine (PONTOCAINE) 0.5 % ophthalmic solution 2 drop (2 drops Left Eye Given 07/03/16 1500)  cyclopentolate (CYCLODRYL,CYCLOGYL) 1 % ophthalmic solution 2 drop (2 drops Left Eye Given by Other 07/03/16 1615)  gabapentin (NEURONTIN) capsule 100 mg (100 mg Oral Given 07/03/16 1701)  oxyCODONE  (Oxy IR/ROXICODONE) immediate release tablet 5 mg (5 mg Oral Given 07/03/16 1701)     Initial Impression / Assessment and Plan / ED Course  I have reviewed the triage vital signs and the nursing notes.  Pertinent labs & imaging results that were available during my care of the patient were reviewed by me and considered in my medical decision making (see chart for details).  Clinical Course   I personally performed the services described in this documentation, which was scribed in my presence. The recorded information has been reviewed and is accurate.  82 yo F with PMHx of recent zoster opthalmicus with facial shingles here with transient, <1 minute, sharp, burning, left facial and eye pain. Pt also has h/o chronic pain. On arrival, VSS and WNL. Neuro exam is non-focal. Eye exam is as above, remarkable for possible persistent versus healing, non-dendritic corneal abrasion versus superficial ulceration but no evidence of large dendrites. Vision impaired but this is baseline per review of records. No new visual field deficits, other neuro sx, and do not suspect TIA or CVA. Retinal exam shows no evidence of CRAO, CRVO, or retinal detachment. Facial rash is healing with no signs of superimposed cellulitis. My primary suspicion is post-herpetic neuralgia, versus persistent zoster, versus trigeminal neuralgia. No evidence of meningitis or encephalitis. Lab work shows mild, likely reactive leukocytosis but is o/w unremarkable. Will discuss with Ophtho on call.  Discussed with on call MD for pt's Ophthalmologist, Dr. Kathrin Penner. Will place pt back on PO valtrex and she will f/u in clinic. No other intervention indicated. Pain improving. Will try to increase pt's gabapentin, advise scheduled analgesics versus PRN, and gave good return precautions.  Final Clinical Impressions(s) / ED Diagnoses  Final diagnoses:  Post herpetic neuralgia    New Prescriptions Discharge Medication List as of 07/03/2016   6:39 PM    START taking these medications   Details  !! hydroxypropyl methylcellulose / hypromellose (ISOPTO TEARS / GONIOVISC) 2.5 % ophthalmic solution Place 1 drop into the left eye 4 (four) times daily., Starting Sat 07/03/2016, Print    valACYclovir (VALTREX) 1000 MG tablet Take 1 tablet (1,000 mg total) by mouth 3 (three) times daily., Starting Sat 07/03/2016, Until Sat 07/17/2016, Print     !! - Potential duplicate medications found. Please discuss with provider.       Duffy Bruce, MD 07/04/16 [id number]

## 2016-07-03 NOTE — ED Triage Notes (Signed)
Per EMS, per staff at assisted living , screaming that her left eye was hurting, no obvious injury, has shingles to left eye x 2 weeks and has finished med for same. Upon arrival of EMS was pain free and watching Tv. BP 138/70, HR 62, Sat 99% Ra, R 16

## 2016-07-03 NOTE — ED Notes (Signed)
Patient suddenly developed burning pain in the left eye.  Dr. Ellender Hose in to examine.  Pain lasted for several minutes and subsided.  Ice pack applied to left face.

## 2016-07-03 NOTE — ED Notes (Signed)
Per staff was assisted living has been c/o of left eye pain today. Recently dx with shingles to face and left eye area, currently on erythromycin ointment bid to OS.. Denies pain at present.

## 2016-07-03 NOTE — ED Triage Notes (Signed)
Currently using erthromycin ointment bid to OS per MAR.

## 2016-07-03 NOTE — ED Notes (Signed)
Patient denies pain and is resting comfortably.  

## 2016-07-03 NOTE — Discharge Instructions (Signed)
Increase your gabapentin from 100 twice daily to 100 three times a day  Take your ultram regularly for the next 2-3 days then as needed  Take your oxycodone for severe pain

## 2016-08-30 ENCOUNTER — Observation Stay (HOSPITAL_COMMUNITY)
Admission: EM | Admit: 2016-08-30 | Discharge: 2016-09-02 | Disposition: A | Discharge status: Skilled Nursing Facility | Payer: Medicare Other | Attending: Internal Medicine | Admitting: Internal Medicine

## 2016-08-30 ENCOUNTER — Emergency Department (HOSPITAL_COMMUNITY): Payer: Medicare Other

## 2016-08-30 ENCOUNTER — Encounter (HOSPITAL_COMMUNITY): Payer: Self-pay | Admitting: Emergency Medicine

## 2016-08-30 DIAGNOSIS — Z85828 Personal history of other malignant neoplasm of skin: Secondary | ICD-10-CM | POA: Insufficient documentation

## 2016-08-30 DIAGNOSIS — M810 Age-related osteoporosis without current pathological fracture: Secondary | ICD-10-CM | POA: Diagnosis not present

## 2016-08-30 DIAGNOSIS — Z66 Do not resuscitate: Secondary | ICD-10-CM | POA: Insufficient documentation

## 2016-08-30 DIAGNOSIS — Z79899 Other long term (current) drug therapy: Secondary | ICD-10-CM | POA: Insufficient documentation

## 2016-08-30 DIAGNOSIS — Z7982 Long term (current) use of aspirin: Secondary | ICD-10-CM | POA: Diagnosis not present

## 2016-08-30 DIAGNOSIS — J449 Chronic obstructive pulmonary disease, unspecified: Secondary | ICD-10-CM | POA: Diagnosis not present

## 2016-08-30 DIAGNOSIS — R52 Pain, unspecified: Secondary | ICD-10-CM

## 2016-08-30 DIAGNOSIS — Z87891 Personal history of nicotine dependence: Secondary | ICD-10-CM | POA: Diagnosis not present

## 2016-08-30 DIAGNOSIS — K219 Gastro-esophageal reflux disease without esophagitis: Secondary | ICD-10-CM | POA: Insufficient documentation

## 2016-08-30 DIAGNOSIS — G8929 Other chronic pain: Secondary | ICD-10-CM | POA: Insufficient documentation

## 2016-08-30 DIAGNOSIS — R262 Difficulty in walking, not elsewhere classified: Secondary | ICD-10-CM

## 2016-08-30 DIAGNOSIS — W19XXXA Unspecified fall, initial encounter: Secondary | ICD-10-CM

## 2016-08-30 DIAGNOSIS — R030 Elevated blood-pressure reading, without diagnosis of hypertension: Secondary | ICD-10-CM | POA: Diagnosis not present

## 2016-08-30 DIAGNOSIS — S22069A Unspecified fracture of T7-T8 vertebra, initial encounter for closed fracture: Secondary | ICD-10-CM | POA: Diagnosis present

## 2016-08-30 DIAGNOSIS — I6782 Cerebral ischemia: Secondary | ICD-10-CM | POA: Diagnosis not present

## 2016-08-30 DIAGNOSIS — Y9301 Activity, walking, marching and hiking: Secondary | ICD-10-CM | POA: Diagnosis not present

## 2016-08-30 DIAGNOSIS — R2689 Other abnormalities of gait and mobility: Secondary | ICD-10-CM

## 2016-08-30 DIAGNOSIS — M545 Low back pain: Secondary | ICD-10-CM | POA: Diagnosis not present

## 2016-08-30 DIAGNOSIS — Z7952 Long term (current) use of systemic steroids: Secondary | ICD-10-CM | POA: Diagnosis not present

## 2016-08-30 DIAGNOSIS — E876 Hypokalemia: Secondary | ICD-10-CM | POA: Insufficient documentation

## 2016-08-30 DIAGNOSIS — Z79891 Long term (current) use of opiate analgesic: Secondary | ICD-10-CM | POA: Insufficient documentation

## 2016-08-30 DIAGNOSIS — D72829 Elevated white blood cell count, unspecified: Secondary | ICD-10-CM | POA: Insufficient documentation

## 2016-08-30 DIAGNOSIS — E039 Hypothyroidism, unspecified: Secondary | ICD-10-CM | POA: Insufficient documentation

## 2016-08-30 DIAGNOSIS — M069 Rheumatoid arthritis, unspecified: Secondary | ICD-10-CM | POA: Insufficient documentation

## 2016-08-30 DIAGNOSIS — M546 Pain in thoracic spine: Secondary | ICD-10-CM

## 2016-08-30 DIAGNOSIS — S22060A Wedge compression fracture of T7-T8 vertebra, initial encounter for closed fracture: Secondary | ICD-10-CM | POA: Diagnosis present

## 2016-08-30 NOTE — ED Provider Notes (Signed)
[facility] DEPT Provider Note   CSN: [id number] Arrival date & time: 08/30/16  2159  By signing my name below, I, Higinio Plan, attest that this documentation has been prepared under the direction and in the presence of Merryl Hacker, MD . Electronically Signed: Higinio Plan, Scribe. 08/30/2016. 11:33 PM.  History   Chief Complaint Chief Complaint  Patient presents with  . Fall   The history is provided by the patient. No language interpreter was used.   HPI Comments: [name] is a 82 y.o. female with PMHx of A-Fib, GERD and diverticulitis, brought in by EMS to the Emergency Department from [facility] complaining of gradually worsening, abdominal pain s/p a witnessed fall that occurred at ~6:00 PM this evening. Pt reports her pain was present before her fall but worsened shortly afterwards. She states she was walking when she suddenly "tripped" and fell onto the ground. She notes she required assistance to get up after her fall and has been unable to ambulate independently. She denies any head injury or loss of consciousness. Pt reports associated wounds to her bilateral lower legs that appeared after her fall; she states the cause of her wounds is unknown.   Past Medical History:  Diagnosis Date  . Atrial fibrillation ([place])   . Carcinoma ([place])    "top of left leg, had it taken off; right cheek, there now" (11/20/2012)  . Chronic lower back pain    "from a ruptured lower disc" (11/20/2012)  . COPD (chronic obstructive pulmonary disease) ([place])    "not bothered w/it anymore" (11/20/2012)  . Diverticulitis   . Diverticulosis   . GERD (gastroesophageal reflux disease)   . Hiatal hernia   . Hypothyroidism   . Mitral valve prolapse   . Orthostatic hypotension   . Osteoarthritis   . Osteoporosis   . Rheumatoid arthritis(714.0)   . Shingles   . Vertigo     Patient Active Problem List   Diagnosis Date Noted  . Shingles 06/15/2016  . Aspiration into airway   .  Pneumonia 10/13/2015  . Aspiration pneumonia ([place]) 10/13/2015  . Sepsis ([place])   . Encephalopathy   . Thyroid activity decreased   . Long term current use of systemic steroids   . Chronic obstructive pulmonary disease ([place])   . Syncope and collapse 11/20/2012  . Hypotension 11/20/2012  . Nausea and vomiting 11/20/2012  . Urinary retention 11/20/2012  . Dysuria R/O UTI 11/20/2012  . Anemia 11/20/2012  . GERD (gastroesophageal reflux disease) 11/20/2012  . Dehydration 01/06/2012  . Gastroenteritis 01/06/2012  . Gait instability 01/06/2012  . Arthritis   . Mitral valve prolapse   . Hiatal hernia   . Diverticulitis   . COPD (chronic obstructive pulmonary disease) ([place])   . Thyroid disease   . Atrial fibrillation ([place])   . Orthostatic hypotension   . Vertigo   . Osteoporosis   . Diverticulosis   . Syncope 08/12/2011  . CARCINOMA, BASAL CELL 06/11/2010  . Rheumatoid arthritis(714.0) 05/22/2009  . Hypothyroidism 05/20/2009  . ABDOMINAL PAIN 05/17/2009    Past Surgical History:  Procedure Laterality Date  . BACK SURGERY    . FIXATION KYPHOPLASTY THORACIC SPINE  ~ 2011   "after falling and breaking 2 vertebra" (11/20/2012)  . SKIN CANCER EXCISION Left ~ 2007   "top of my lower leg" (11/20/2012)  . TENDON REPAIR Right ~ 2009   wrist (11/20/2012)  . TONSILLECTOMY  ~ 1933    OB History    No  data available       Home Medications    Prior to Admission medications   Medication Sig Start Date End Date Taking? Authorizing Provider  acetaminophen (TYLENOL) 325 MG tablet Take 650 mg by mouth every 4 (four) hours as needed for mild pain, moderate pain, fever or headache.    Yes Historical Provider, MD  aspirin 81 MG chewable tablet Chew 81 mg by mouth daily.   Yes Historical Provider, MD  bisacodyl (DULCOLAX) 10 MG suppository Place 10 mg rectally as needed for moderate constipation.   Yes Historical Provider, MD  calcium carbonate (TUMS - DOSED IN MG ELEMENTAL CALCIUM) 500 MG  chewable tablet Chew 1 tablet by mouth daily.   Yes Historical Provider, MD  cholecalciferol (VITAMIN D) 1000 units tablet Take 2,000 Units by mouth daily.   Yes Historical Provider, MD  cyanocobalamin 100 MCG tablet Take 100 mcg by mouth daily.   Yes Historical Provider, MD  diclofenac sodium (VOLTAREN) 1 % GEL Apply 2 g topically every 6 (six) hours as needed (for pain).    Yes Historical Provider, MD  folic acid (FOLVITE) 1 MG tablet Take 1 mg by mouth daily.   Yes Historical Provider, MD  gabapentin (NEURONTIN) 100 MG capsule Take 1 capsule (100 mg total) by mouth 3 (three) times daily. Patient taking differently: Take 100 mg by mouth 2 (two) times daily.  07/03/16 08/31/16 Yes Duffy Bruce, MD  hydroxychloroquine (PLAQUENIL) 200 MG tablet Take 200 mg by mouth at bedtime.    Yes Historical Provider, MD  hydroxypropyl methylcellulose / hypromellose (ISOPTO TEARS / GONIOVISC) 2.5 % ophthalmic solution Place 1 drop into the left eye 4 (four) times daily. 07/03/16  Yes Duffy Bruce, MD  levothyroxine (SYNTHROID, LEVOTHROID) 50 MCG tablet Take 50 mcg by mouth daily before breakfast.    Yes Historical Provider, MD  loperamide (IMODIUM) 2 MG capsule Take 2 mg by mouth as needed for diarrhea or loose stools.   Yes Historical Provider, MD  magnesium gluconate (MAGONATE) 500 MG tablet Take 500 mg by mouth 2 (two) times daily.   Yes Historical Provider, MD  magnesium hydroxide (MILK OF MAGNESIA) 400 MG/5ML suspension Take 30 mLs by mouth daily as needed for mild constipation or moderate constipation.    Yes Historical Provider, MD  meclizine (ANTIVERT) 12.5 MG tablet Take 12.5 mg by mouth 3 (three) times daily as needed for dizziness.    Yes Historical Provider, MD  morphine (MS CONTIN) 15 MG 12 hr tablet Take 15 mg by mouth daily.   Yes Historical Provider, MD  oxyCODONE (OXY IR/ROXICODONE) 5 MG immediate release tablet Take 5 mg by mouth every 4 (four) hours as needed for severe pain.   Yes Historical  Provider, MD  pantoprazole (PROTONIX) 40 MG tablet Take 40 mg by mouth daily before breakfast.    Yes Historical Provider, MD  polyethylene glycol (MIRALAX / GLYCOLAX) packet Take 17 g by mouth daily as needed for mild constipation or moderate constipation.   Yes Historical Provider, MD  predniSONE (DELTASONE) 5 MG tablet Take 5 mg by mouth daily with breakfast.   Yes Historical Provider, MD  promethazine (PHENERGAN) 25 MG tablet Take 25 mg by mouth every 4 (four) hours as needed for nausea or vomiting.   Yes Historical Provider, MD  senna-docusate (SENOKOT-S) 8.6-50 MG per tablet Take 2 tablets by mouth at bedtime. Pt is able to take an additional tablet during the day if needed.   Yes Historical Provider, MD  sertraline (ZOLOFT)  50 MG tablet Take 75 mg by mouth daily.   Yes Historical Provider, MD  traMADol (ULTRAM) 50 MG tablet Take 50 mg by mouth every 6 (six) hours as needed for moderate pain.   Yes Historical Provider, MD    Family History Family History  Problem Relation Age of Onset  . Cancer Mother     bladder  . Hyperlipidemia Sister   . Stroke Maternal Aunt     Social History Social History  Substance Use Topics  . Smoking status: Former Smoker    Packs/day: 0.12    Years: 40.00    Types: Cigarettes    Quit date: 09/06/1985  . Smokeless tobacco: Never Used     Comment: 11/20/2012 "stopped smoking 27 yr ago"  . Alcohol use No     Allergies   Lactose intolerance (gi)  Review of Systems Review of Systems  Respiratory: Negative for shortness of breath.   Cardiovascular: Negative for chest pain.  Gastrointestinal: Positive for abdominal pain. Negative for diarrhea, nausea and vomiting.  Musculoskeletal: Negative for back pain and gait problem.  Skin: Positive for wound.  Neurological: Negative for syncope and weakness.  All other systems reviewed and are negative.  Physical Exam Updated Vital Signs BP 153/80   Pulse 80   Temp 97.3 F (36.3 C) (Oral)   Resp 20    SpO2 96%   Physical Exam  Constitutional: She is oriented to person, place, and time.  Elderly, no acute distress  HENT:  Head: Normocephalic and atraumatic.  Eyes: Pupils are equal, round, and reactive to light.  Neck: Neck supple.  No midline C-spine tenderness, normal range of motion  Cardiovascular: Normal rate, regular rhythm and normal heart sounds.   No murmur heard. Pulmonary/Chest: Effort normal and breath sounds normal. No respiratory distress. She has no wheezes.  Abdominal: Soft. Bowel sounds are normal. There is no tenderness. There is no guarding.  Musculoskeletal: Normal range of motion. She exhibits no deformity.  Normal range of motion of bilateral hips and knees, no obvious deformities, tenderness palpation mid thoracic spine without step off or deformity  Neurological: She is alert and oriented to person, place, and time.  Normal reflexes bilateral lower extremities, 5 out of 5 strength bilateral lower extremities  Skin: Skin is warm and dry.  Skin tear right posterior calf and left posterior knee  Psychiatric: She has a normal mood and affect.  Nursing note and vitals reviewed.    ED Treatments / Results  Labs (all labs ordered are listed, but only abnormal results are displayed) Labs Reviewed  CBC WITH DIFFERENTIAL/PLATELET - Abnormal; Notable for the following:       Result Value   WBC 17.2 (*)    RBC 3.74 (*)    MCV 101.6 (*)    Neutro Abs 13.1 (*)    Monocytes Absolute 1.2 (*)    All other components within normal limits  COMPREHENSIVE METABOLIC PANEL - Abnormal; Notable for the following:    Sodium 132 (*)    Potassium 3.4 (*)    Chloride 99 (*)    Glucose, Bld 105 (*)    Total Protein 6.0 (*)    Albumin 3.4 (*)    All other components within normal limits  URINALYSIS, ROUTINE W REFLEX MICROSCOPIC    EKG  EKG Interpretation  Date/Time:  Tuesday August 31 2016 00:05:05 EST Ventricular Rate:  79 PR Interval:    QRS Duration: 122 QT  Interval:  426 QTC Calculation: 489 R Axis:   -  60 Text Interpretation:  Sinus rhythm Atrial premature complexes IVCD, consider atypical RBBB Left ventricular hypertrophy Interpretation limited secondary to artifact No significant change since last tracing Confirmed by Orlo Brickle  MD, [place] ([id number]) on 08/31/2016 12:40:48 AM       Radiology Dg Chest 2 View  Result Date: 08/30/2016 CLINICAL DATA:  Witnessed fall.  Chronic left-sided rib pain. EXAM: CHEST  2 VIEW COMPARISON:  CXR dating back through 04/08/2011 FINDINGS: Emphysematous hyperinflation of the lungs. The patient's left hand obscures the left lung base. No pneumothorax or pulmonary consolidation. Heart is top-normal in size. There is aortic atherosclerosis. No CHF. Sclerotic foci of the left humeral head and neck appear chronic dating back to 2012 and are likely benign findings. Vertebral body augmentation at T12. IMPRESSION: Chronic stable emphysematous hyperinflation of the lungs. No acute osseous abnormality. Electronically Signed   By: Ashley Royalty M.D.   On: 08/30/2016 22:57   Dg Thoracic Spine 2 View  Result Date: 08/31/2016 CLINICAL DATA:  Back pain after fall. EXAM: THORACIC SPINE 2 VIEWS COMPARISON:  Chest radiograph 06/15/2016, 10/14/2015 FINDINGS: Mild T8 compression fracture with approximately 50% loss of height anteriorly, new from October 2017. T12 compression fracture with vertebral augmentation, chronic. No additional fracture. No paravertebral soft tissue abnormality. IMPRESSION: Mild T8 compression fracture, new from October 2017. Electronically Signed   By: Jeb Levering M.D.   On: 08/31/2016 03:40   Dg Lumbar Spine Complete  Result Date: 08/31/2016 CLINICAL DATA:  Acute on chronic lumbosacral back pain after fall. EXAM: LUMBAR SPINE - COMPLETE 4+ VIEW COMPARISON:  CT abdomen/ pelvis reformats 04/05/2014 FINDINGS: Moderate dextroscoliotic curvature of the lumbar spine. Associated multilevel degenerative change.  Kyphoplasty within T12 compression fracture, remaining lumbar vertebral body heights are preserved. There is anterior wedging of T8 vertebral body in the thoracic spine. Diffuse disc space narrowing and endplate spurring. IMPRESSION: Scoliosis and multilevel degenerative changes the lumbar spine. No acute abnormality. T12 compression fracture with augmentation. T8 compression fracture, assessed on thoracic spine radiographs. Electronically Signed   By: Jeb Levering M.D.   On: 08/31/2016 03:37   Ct Head Wo Contrast  Result Date: 08/31/2016 CLINICAL DATA:  Trip and fall.  No loss of consciousness. EXAM: CT HEAD WITHOUT CONTRAST TECHNIQUE: Contiguous axial images were obtained from the base of the skull through the vertex without intravenous contrast. COMPARISON:  Head CT 11/20/2012 FINDINGS: Brain: Generalized atrophy, stable from prior exam. Stable chronic small vessel ischemia. No intracranial hemorrhage, mass effect, or midline shift. No hydrocephalus. The basilar cisterns are patent. No evidence of territorial infarct. No intracranial fluid collection. Vascular: Atherosclerosis of skullbase vasculature without hyperdense vessel or abnormal calcification. Skull: Normal. Negative for fracture or focal lesion. Sinuses/Orbits: No acute finding. Other: None. IMPRESSION: No acute intracranial abnormality. Stable atrophy and chronic small vessel ischemia. Electronically Signed   By: Jeb Levering M.D.   On: 08/31/2016 04:38    Procedures Procedures (including critical care time)  Medications Ordered in ED Medications  HYDROcodone-acetaminophen (NORCO/VICODIN) 5-325 MG per tablet 1 tablet (not administered)  acetaminophen (TYLENOL) tablet 650 mg (650 mg Oral Given 08/31/16 0317)    DIAGNOSTIC STUDIES:  Oxygen Saturation is 96% on RA, normal by my interpretation.    COORDINATION OF CARE:  11:32 PM Discussed treatment plan with pt at bedside and pt agreed to plan.  Initial Impression /  Assessment and Plan / ED Course  I have reviewed the triage vital signs and the nursing notes.  Pertinent labs & imaging results that  were available during my care of the patient were reviewed by me and considered in my medical decision making (see chart for details).  Clinical Course as of Aug 31 442  Tue Aug 31, 2016  [id number] Patient with T8 compression fracture. Discussed with Dr. Sherwood Gambler. Request TLSO. Bed rest 24 hours. He will see patient in a physical consultation. TLSO ordered.  [CH]    Clinical Course User Index [CH] Merryl Hacker, MD    Patient presents following a fall. She is awake, alert, and oriented 3. She describes a mechanical fall. She reports left-sided abdominal pain. Reported chronic left-sided rib pain. Initially imaging and lab workup is reassuring. Patient however, reports persistent pain. Now complaining of back pain. No step off or deformity noted. Neurologically intact. X-rays obtained. X-ray showed T8 compression fracture. This is likely the culprit of the patient's pain. She is able to ambulate but requires 2 person assist and is very unsteady. Discussions with neurosurgery above. Recommending 24-hour bed rest and TLSO brace. He will see in formal consultation. No other acute injuries noted. Plan to admit to the hospitalist service for pain management and physical therapy.  I personally performed the services described in this documentation, which was scribed in my presence. The recorded information has been reviewed and is accurate.   Final Clinical Impressions(s) / ED Diagnoses   Final diagnoses:  Fall, initial encounter  Closed wedge compression fracture of eighth thoracic vertebra, initial encounter [facility])    New Prescriptions New Prescriptions   No medications on file     Merryl Hacker, MD 08/31/16 [phone]

## 2016-08-30 NOTE — ED Triage Notes (Signed)
Per EMS, pt from [facility] with c/o witnessed fall. Pt has chronic left sided rib pain, states the pain feels "worse" after falling. BP-167/89, HR-78, SpO2-95% room air.

## 2016-08-31 ENCOUNTER — Emergency Department (HOSPITAL_COMMUNITY): Payer: Medicare Other

## 2016-08-31 ENCOUNTER — Observation Stay (HOSPITAL_COMMUNITY): Payer: Medicare Other

## 2016-08-31 ENCOUNTER — Encounter (HOSPITAL_COMMUNITY): Payer: Self-pay | Admitting: Internal Medicine

## 2016-08-31 DIAGNOSIS — S22060A Wedge compression fracture of T7-T8 vertebra, initial encounter for closed fracture: Secondary | ICD-10-CM | POA: Diagnosis present

## 2016-08-31 DIAGNOSIS — W19XXXA Unspecified fall, initial encounter: Secondary | ICD-10-CM

## 2016-08-31 DIAGNOSIS — E039 Hypothyroidism, unspecified: Secondary | ICD-10-CM

## 2016-08-31 DIAGNOSIS — S22069A Unspecified fracture of T7-T8 vertebra, initial encounter for closed fracture: Secondary | ICD-10-CM | POA: Diagnosis present

## 2016-08-31 LAB — URINALYSIS, ROUTINE W REFLEX MICROSCOPIC
Bilirubin Urine: NEGATIVE
Glucose, UA: NEGATIVE mg/dL
HGB URINE DIPSTICK: NEGATIVE
Ketones, ur: NEGATIVE mg/dL
LEUKOCYTES UA: NEGATIVE
Nitrite: NEGATIVE
PROTEIN: NEGATIVE mg/dL
Specific Gravity, Urine: 1.01 (ref 1.005–1.030)
pH: 7 (ref 5.0–8.0)

## 2016-08-31 LAB — CBC WITH DIFFERENTIAL/PLATELET
BASOS ABS: 0 10*3/uL (ref 0.0–0.1)
Basophils Relative: 0 %
Eosinophils Absolute: 0.3 10*3/uL (ref 0.0–0.7)
Eosinophils Relative: 2 %
HEMATOCRIT: 38 % (ref 36.0–46.0)
Hemoglobin: 12.5 g/dL (ref 12.0–15.0)
LYMPHS ABS: 2.6 10*3/uL (ref 0.7–4.0)
Lymphocytes Relative: 15 %
MCH: 33.4 pg (ref 26.0–34.0)
MCHC: 32.9 g/dL (ref 30.0–36.0)
MCV: 101.6 fL — AB (ref 78.0–100.0)
MONOS PCT: 7 %
Monocytes Absolute: 1.2 10*3/uL — ABNORMAL HIGH (ref 0.1–1.0)
NEUTROS ABS: 13.1 10*3/uL — AB (ref 1.7–7.7)
Neutrophils Relative %: 76 %
Platelets: 308 10*3/uL (ref 150–400)
RBC: 3.74 MIL/uL — ABNORMAL LOW (ref 3.87–5.11)
RDW: 14.7 % (ref 11.5–15.5)
WBC: 17.2 10*3/uL — ABNORMAL HIGH (ref 4.0–10.5)

## 2016-08-31 LAB — COMPREHENSIVE METABOLIC PANEL
ALT: 28 U/L (ref 14–54)
ANION GAP: 11 (ref 5–15)
AST: 34 U/L (ref 15–41)
Albumin: 3.4 g/dL — ABNORMAL LOW (ref 3.5–5.0)
Alkaline Phosphatase: 62 U/L (ref 38–126)
BILIRUBIN TOTAL: 0.5 mg/dL (ref 0.3–1.2)
BUN: 15 mg/dL (ref 6–20)
CO2: 22 mmol/L (ref 22–32)
Calcium: 8.9 mg/dL (ref 8.9–10.3)
Chloride: 99 mmol/L — ABNORMAL LOW (ref 101–111)
Creatinine, Ser: 0.79 mg/dL (ref 0.44–1.00)
Glucose, Bld: 105 mg/dL — ABNORMAL HIGH (ref 65–99)
POTASSIUM: 3.4 mmol/L — AB (ref 3.5–5.1)
Sodium: 132 mmol/L — ABNORMAL LOW (ref 135–145)
TOTAL PROTEIN: 6 g/dL — AB (ref 6.5–8.1)

## 2016-08-31 MED ORDER — ONDANSETRON HCL 4 MG/2ML IJ SOLN: 4.0000 mg | Freq: Four times a day (QID) | INTRAMUSCULAR | Status: DC | PRN

## 2016-08-31 MED ORDER — VITAMIN B-12 100 MCG PO TABS
100.0000 ug | ORAL_TABLET | Freq: Every day | ORAL | Status: DC
  Administered 2016-08-31 – 2016-09-02 (×3): 100 ug via ORAL
  Filled 2016-08-31 (×3): qty 1

## 2016-08-31 MED ORDER — SERTRALINE HCL 50 MG PO TABS
75.0000 mg | ORAL_TABLET | Freq: Every day | ORAL | Status: DC
  Administered 2016-08-31 – 2016-09-02 (×3): 75 mg via ORAL
  Filled 2016-08-31 (×3): qty 2

## 2016-08-31 MED ORDER — ONDANSETRON HCL 4 MG PO TABS: 4.0000 mg | ORAL_TABLET | Freq: Four times a day (QID) | ORAL | Status: DC | PRN

## 2016-08-31 MED ORDER — MECLIZINE HCL 12.5 MG PO TABS: 12.5000 mg | ORAL_TABLET | Freq: Three times a day (TID) | ORAL | Status: DC | PRN

## 2016-08-31 MED ORDER — POTASSIUM CHLORIDE CRYS ER 20 MEQ PO TBCR
40.0000 meq | EXTENDED_RELEASE_TABLET | Freq: Once | ORAL | Status: AC
  Administered 2016-08-31: 40 meq via ORAL
  Filled 2016-08-31: qty 2

## 2016-08-31 MED ORDER — OXYCODONE HCL 5 MG PO TABS
5.0000 mg | ORAL_TABLET | ORAL | Status: DC | PRN
  Administered 2016-08-31 – 2016-09-02 (×4): 5 mg via ORAL
  Filled 2016-08-31 (×4): qty 1

## 2016-08-31 MED ORDER — ACETAMINOPHEN 325 MG PO TABS
650.0000 mg | ORAL_TABLET | Freq: Once | ORAL | Status: AC
Start: 2016-08-31 — End: 2016-08-31
  Administered 2016-08-31: 650 mg via ORAL
  Filled 2016-08-31: qty 2

## 2016-08-31 MED ORDER — PANTOPRAZOLE SODIUM 40 MG PO TBEC
40.0000 mg | DELAYED_RELEASE_TABLET | Freq: Every day | ORAL | Status: DC
  Administered 2016-08-31 – 2016-09-02 (×3): 40 mg via ORAL
  Filled 2016-08-31 (×3): qty 1

## 2016-08-31 MED ORDER — ACETAMINOPHEN 650 MG RE SUPP: 650.0000 mg | Freq: Four times a day (QID) | RECTAL | Status: DC | PRN

## 2016-08-31 MED ORDER — HYDRALAZINE HCL 20 MG/ML IJ SOLN: 10.0000 mg | INTRAMUSCULAR | Status: DC | PRN

## 2016-08-31 MED ORDER — GABAPENTIN 100 MG PO CAPS
100.0000 mg | ORAL_CAPSULE | Freq: Two times a day (BID) | ORAL | Status: DC
Start: 2016-08-31 — End: 2016-09-02
  Administered 2016-08-31 – 2016-09-02 (×5): 100 mg via ORAL
  Filled 2016-08-31 (×5): qty 1

## 2016-08-31 MED ORDER — PREDNISONE 5 MG PO TABS
5.0000 mg | ORAL_TABLET | Freq: Every day | ORAL | Status: DC
  Administered 2016-08-31 – 2016-09-02 (×3): 5 mg via ORAL
  Filled 2016-08-31 (×3): qty 1

## 2016-08-31 MED ORDER — HYDROXYCHLOROQUINE SULFATE 200 MG PO TABS
200.0000 mg | ORAL_TABLET | Freq: Every day | ORAL | Status: DC
  Administered 2016-08-31 – 2016-09-01 (×2): 200 mg via ORAL
  Filled 2016-08-31 (×2): qty 1

## 2016-08-31 MED ORDER — DICLOFENAC SODIUM 1 % TD GEL
2.0000 g | Freq: Four times a day (QID) | TRANSDERMAL | Status: DC | PRN
  Filled 2016-08-31: qty 100

## 2016-08-31 MED ORDER — MORPHINE SULFATE ER 15 MG PO TBCR
15.0000 mg | EXTENDED_RELEASE_TABLET | Freq: Every day | ORAL | Status: DC
  Administered 2016-08-31 – 2016-09-02 (×3): 15 mg via ORAL
  Filled 2016-08-31 (×3): qty 1

## 2016-08-31 MED ORDER — POLYETHYLENE GLYCOL 3350 17 G PO PACK: 17.0000 g | PACK | Freq: Every day | ORAL | Status: DC | PRN

## 2016-08-31 MED ORDER — BISACODYL 10 MG RE SUPP: 10.0000 mg | RECTAL | Status: DC | PRN

## 2016-08-31 MED ORDER — HYDROCODONE-ACETAMINOPHEN 5-325 MG PO TABS: 1.0000 | ORAL_TABLET | Freq: Once | ORAL | Status: DC

## 2016-08-31 MED ORDER — MAGNESIUM GLUCONATE 500 MG PO TABS
500.0000 mg | ORAL_TABLET | Freq: Two times a day (BID) | ORAL | Status: DC
  Administered 2016-08-31 – 2016-09-02 (×5): 500 mg via ORAL
  Filled 2016-08-31 (×5): qty 1

## 2016-08-31 MED ORDER — LEVOTHYROXINE SODIUM 50 MCG PO TABS
50.0000 ug | ORAL_TABLET | Freq: Every day | ORAL | Status: DC
  Administered 2016-08-31 – 2016-09-02 (×3): 50 ug via ORAL
  Filled 2016-08-31 (×3): qty 1

## 2016-08-31 MED ORDER — SENNOSIDES-DOCUSATE SODIUM 8.6-50 MG PO TABS
2.0000 | ORAL_TABLET | Freq: Every day | ORAL | Status: DC
  Administered 2016-08-31 – 2016-09-01 (×2): 2 via ORAL
  Filled 2016-08-31 (×2): qty 2

## 2016-08-31 MED ORDER — PROMETHAZINE HCL 25 MG PO TABS: 25.0000 mg | ORAL_TABLET | ORAL | Status: DC | PRN

## 2016-08-31 MED ORDER — FOLIC ACID 1 MG PO TABS
1.0000 mg | ORAL_TABLET | Freq: Every day | ORAL | Status: DC
  Administered 2016-08-31 – 2016-09-02 (×3): 1 mg via ORAL
  Filled 2016-08-31 (×3): qty 1

## 2016-08-31 MED ORDER — CALCIUM CARBONATE ANTACID 500 MG PO CHEW
1.0000 | CHEWABLE_TABLET | Freq: Every day | ORAL | Status: DC
  Administered 2016-08-31 – 2016-09-02 (×3): 200 mg via ORAL
  Filled 2016-08-31 (×3): qty 1

## 2016-08-31 MED ORDER — TRAMADOL HCL 50 MG PO TABS
50.0000 mg | ORAL_TABLET | Freq: Four times a day (QID) | ORAL | Status: DC | PRN
  Administered 2016-08-31 – 2016-09-01 (×3): 50 mg via ORAL
  Filled 2016-08-31 (×3): qty 1

## 2016-08-31 MED ORDER — ACETAMINOPHEN 325 MG PO TABS
650.0000 mg | ORAL_TABLET | Freq: Four times a day (QID) | ORAL | Status: DC | PRN
  Filled 2016-08-31: qty 2

## 2016-08-31 MED ORDER — ASPIRIN 81 MG PO CHEW
81.0000 mg | CHEWABLE_TABLET | Freq: Every day | ORAL | Status: DC
  Administered 2016-08-31 – 2016-09-02 (×3): 81 mg via ORAL
  Filled 2016-08-31 (×3): qty 1

## 2016-08-31 MED ORDER — HYPROMELLOSE (GONIOSCOPIC) 2.5 % OP SOLN
1.0000 [drp] | Freq: Four times a day (QID) | OPHTHALMIC | Status: DC
  Administered 2016-08-31 – 2016-09-02 (×9): 1 [drp] via OPHTHALMIC
  Filled 2016-08-31: qty 15

## 2016-08-31 MED ORDER — VITAMIN D 1000 UNITS PO TABS
2000.0000 [IU] | ORAL_TABLET | Freq: Every day | ORAL | Status: DC
  Administered 2016-08-31 – 2016-09-02 (×3): 2000 [IU] via ORAL
  Filled 2016-08-31 (×3): qty 2

## 2016-08-31 NOTE — Consult Note (Signed)
Reason for Consult:  T8 compression fracture Referring Physician:  Dr. Ross Marcus  [name] is an 82 y.o. female.  HPI:  Patient who is a resident of the [facility] skilled nursing facility at [facility] fell and subsequently developed back pain that radiated around her abdomen. Patient was brought by EMS to the [facility] emergency room and evaluated by Dr. Wilkie Aye (EDP).  X-rays revealed a new moderate T8 compression fracture (patient has evidence of old T12 compression fracture, status post kyphoplasty). Arrangements were made for admission to the [facility], neurosurgical consultation was requested.  A VertAlign TLSO has been requested by Dr. Wilkie Aye. PT and OT have been consulted.  Currently the patient is having discomfort in the back that does radiate around her torso. She denies weakness or numbness in the lower extremities.  Her history is notable for long-standing rheumatoid arthritis on chronic prednisone, atrial fibrillation, chronic low back pain.  Past Medical History:  Past Medical History:  Diagnosis Date  . Atrial fibrillation (HCC)   . Carcinoma (HCC)    "top of left leg, had it taken off; right cheek, there now" (11/20/2012)  . Chronic lower back pain    "from a ruptured lower disc" (11/20/2012)  . COPD (chronic obstructive pulmonary disease) (HCC)    "not bothered w/it anymore" (11/20/2012)  . Diverticulitis   . Diverticulosis   . GERD (gastroesophageal reflux disease)   . Hiatal hernia   . Hypothyroidism   . Mitral valve prolapse   . Orthostatic hypotension   . Osteoarthritis   . Osteoporosis   . Rheumatoid arthritis(714.0)   . Shingles   . Vertigo     Past Surgical History:  Past Surgical History:  Procedure Laterality Date  . BACK SURGERY    . FIXATION KYPHOPLASTY THORACIC SPINE  ~ 2011   "after falling and breaking 2 vertebra" (11/20/2012)  . SKIN CANCER EXCISION Left ~ 2007   "top of my lower leg" (11/20/2012)   . TENDON REPAIR Right ~ 2009   wrist (11/20/2012)  . TONSILLECTOMY  ~ 86    Family History:  Family History  Problem Relation Age of Onset  . Cancer Mother     bladder  . Hyperlipidemia Sister   . Stroke Maternal Aunt     Social History:  reports that she quit smoking about 31 years ago. Her smoking use included Cigarettes. She has a 4.80 pack-year smoking history. She has never used smokeless tobacco. She reports that she does not drink alcohol or use drugs.  Allergies:  Allergies  Allergen Reactions  . Lactose Intolerance (Gi) Other (See Comments)    Reaction:  GI upset     Medications: I have reviewed the patient's current medications.  ROS:  Notable for those described her history of present illness and past medical history, but is otherwise unremarkable.  Physical Examination: Elderly white female resting comfortably in bed, in no acute distress. Blood pressure (!) 142/58, pulse 74, temperature 97.9 F (36.6 C), temperature source Oral, resp. rate 16, SpO2 95 %. Lungs:  Clear to auscultation, symmetrical respiratory excursion. Heart:  Irregular irregular rhythm, no murmur. Abdomen:  Soft, nondistended, bowel sounds present. Extremity:  Ecchymosis in distal lower extremities, no edema.  Neurological Examination: Mental Status Examination:  Awake and alert, oriented to name, [facility], and December 2017. Following commands. Speech fluent. Cranial Nerve Examination:  Pupils equal, round, reactive to light. EOMI. Facial sensation intact. Facial movements symmetrical. Hearing diminished (chronic). Out  movements symmetrical shoulder shrug symmetrical tongue midline. Motor Examination:  5/5 strength in the upper and lower extremities. No drift of the upper extremities. Sensory Examination:  Intact in the upper and lower extremities. Reflex Examination:   Diminished, but symmetrical. Gait and Stance Examination:  Not tested due to the nature the patient's  condition, the TLSO has not yet arrived.   Results for orders placed or performed during the hospital encounter of 08/30/16 (from the past 48 hour(s))  CBC with Differential     Status: Abnormal   Collection Time: 08/30/16 11:35 PM  Result Value Ref Range   WBC 17.2 (H) 4.0 - 10.5 K/uL   RBC 3.74 (L) 3.87 - 5.11 MIL/uL   Hemoglobin 12.5 12.0 - 15.0 g/dL   HCT 38.0 36.0 - 46.0 %   MCV 101.6 (H) 78.0 - 100.0 fL   MCH 33.4 26.0 - 34.0 pg   MCHC 32.9 30.0 - 36.0 g/dL   RDW 14.7 11.5 - 15.5 %   Platelets 308 150 - 400 K/uL   Neutrophils Relative % 76 %   Lymphocytes Relative 15 %   Monocytes Relative 7 %   Eosinophils Relative 2 %   Basophils Relative 0 %   Neutro Abs 13.1 (H) 1.7 - 7.7 K/uL   Lymphs Abs 2.6 0.7 - 4.0 K/uL   Monocytes Absolute 1.2 (H) 0.1 - 1.0 K/uL   Eosinophils Absolute 0.3 0.0 - 0.7 K/uL   Basophils Absolute 0.0 0.0 - 0.1 K/uL   WBC Morphology MILD LEFT SHIFT (1-5% METAS, OCC MYELO, OCC BANDS)     Comment: ATYPICAL LYMPHOCYTES TOXIC GRANULATION   Comprehensive metabolic panel     Status: Abnormal   Collection Time: 08/30/16 11:35 PM  Result Value Ref Range   Sodium 132 (L) 135 - 145 mmol/L   Potassium 3.4 (L) 3.5 - 5.1 mmol/L   Chloride 99 (L) 101 - 111 mmol/L   CO2 22 22 - 32 mmol/L   Glucose, Bld 105 (H) 65 - 99 mg/dL   BUN 15 6 - 20 mg/dL   Creatinine, Ser 0.79 0.44 - 1.00 mg/dL   Calcium 8.9 8.9 - 10.3 mg/dL   Total Protein 6.0 (L) 6.5 - 8.1 g/dL   Albumin 3.4 (L) 3.5 - 5.0 g/dL   AST 34 15 - 41 U/L   ALT 28 14 - 54 U/L   Alkaline Phosphatase 62 38 - 126 U/L   Total Bilirubin 0.5 0.3 - 1.2 mg/dL   GFR calc non Af Amer >60 >60 mL/min   GFR calc Af Amer >60 >60 mL/min    Comment: (NOTE) The eGFR has been calculated using the CKD EPI equation. This calculation has not been validated in all clinical situations. eGFR's persistently <60 mL/min signify possible Chronic Kidney Disease.    Anion gap 11 5 - 15  Urinalysis, Routine w reflex microscopic      Status: None   Collection Time: 08/31/16  1:24 AM  Result Value Ref Range   Color, Urine YELLOW YELLOW   APPearance CLEAR CLEAR   Specific Gravity, Urine 1.010 1.005 - 1.030   pH 7.0 5.0 - 8.0   Glucose, UA NEGATIVE NEGATIVE mg/dL   Hgb urine dipstick NEGATIVE NEGATIVE   Bilirubin Urine NEGATIVE NEGATIVE   Ketones, ur NEGATIVE NEGATIVE mg/dL   Protein, ur NEGATIVE NEGATIVE mg/dL   Nitrite NEGATIVE NEGATIVE   Leukocytes, UA NEGATIVE NEGATIVE    Dg Chest 2 View  Result Date: 08/30/2016 CLINICAL DATA:  Witnessed fall.  Chronic left-sided rib pain. EXAM: CHEST  2 VIEW COMPARISON:  CXR dating back through 04/08/2011 FINDINGS: Emphysematous hyperinflation of the lungs. The patient's left hand obscures the left lung base. No pneumothorax or pulmonary consolidation. Heart is top-normal in size. There is aortic atherosclerosis. No CHF. Sclerotic foci of the left humeral head and neck appear chronic dating back to 2012 and are likely benign findings. Vertebral body augmentation at T12. IMPRESSION: Chronic stable emphysematous hyperinflation of the lungs. No acute osseous abnormality. Electronically Signed   By: Ashley Royalty M.D.   On: 08/30/2016 22:57   Dg Thoracic Spine 2 View  Result Date: 08/31/2016 CLINICAL DATA:  Back pain after fall. EXAM: THORACIC SPINE 2 VIEWS COMPARISON:  Chest radiograph 06/15/2016, 10/14/2015 FINDINGS: Mild T8 compression fracture with approximately 50% loss of height anteriorly, new from October 2017. T12 compression fracture with vertebral augmentation, chronic. No additional fracture. No paravertebral soft tissue abnormality. IMPRESSION: Mild T8 compression fracture, new from October 2017. Electronically Signed   By: Jeb Levering M.D.   On: 08/31/2016 03:40   Dg Lumbar Spine Complete  Result Date: 08/31/2016 CLINICAL DATA:  Acute on chronic lumbosacral back pain after fall. EXAM: LUMBAR SPINE - COMPLETE 4+ VIEW COMPARISON:  CT abdomen/ pelvis reformats  04/05/2014 FINDINGS: Moderate dextroscoliotic curvature of the lumbar spine. Associated multilevel degenerative change. Kyphoplasty within T12 compression fracture, remaining lumbar vertebral body heights are preserved. There is anterior wedging of T8 vertebral body in the thoracic spine. Diffuse disc space narrowing and endplate spurring. IMPRESSION: Scoliosis and multilevel degenerative changes the lumbar spine. No acute abnormality. T12 compression fracture with augmentation. T8 compression fracture, assessed on thoracic spine radiographs. Electronically Signed   By: Jeb Levering M.D.   On: 08/31/2016 03:37   Ct Head Wo Contrast  Result Date: 08/31/2016 CLINICAL DATA:  Trip and fall.  No loss of consciousness. EXAM: CT HEAD WITHOUT CONTRAST TECHNIQUE: Contiguous axial images were obtained from the base of the skull through the vertex without intravenous contrast. COMPARISON:  Head CT 11/20/2012 FINDINGS: Brain: Generalized atrophy, stable from prior exam. Stable chronic small vessel ischemia. No intracranial hemorrhage, mass effect, or midline shift. No hydrocephalus. The basilar cisterns are patent. No evidence of territorial infarct. No intracranial fluid collection. Vascular: Atherosclerosis of skullbase vasculature without hyperdense vessel or abnormal calcification. Skull: Normal. Negative for fracture or focal lesion. Sinuses/Orbits: No acute finding. Other: None. IMPRESSION: No acute intracranial abnormality. Stable atrophy and chronic small vessel ischemia. Electronically Signed   By: Jeb Levering M.D.   On: 08/31/2016 04:38     Assessment/Plan: Elderly female on chronic prednisone for rheumatoid arthritis who fell and has been found to have a new T8 compression fracture (she has an old T12 compression fracture, status post kyphoplasty years ago). She describes discomfort in the mid back that radiates around the torso. She is currently on bedrest, awaiting a VertAlign TLSO. She will be  able to be mobilized once that arrives with PT and OT. Upright AP and lateral thoracic spine x-rays will be needed once she is up and about to assess the fracture. We will also need to continue to monitor her pain control. If there is progressive loss of vertebral body height or if pain related to her fracture is not adequately controlled, kyphoplasty could be considered. We would need an assessment from her hospitalist of her suitability to undergo general anesthesia. For now I agree with continue her on SCDs and holding Lovenox or other anticoagulants pending a decision if surgery  is a consideration or needed.  No family was present at the bedside, but I did speak with the patient regarding my assessment and recommendations. Her questions were answered for her.  Ultimately she will most likely need to transfer back to the rehabilitation unit at [facility], to eventually transfer back to the skilled nursing unit at [facility].  Hosie Spangle, MD 08/31/2016, 8:26 AM

## 2016-08-31 NOTE — H&P (Signed)
History and Physical    KEYSHIA KEARN [id number] DOB: 07/26/1924 DOA: 08/30/2016  PCP: Willodean Rosenthal, MD  Patient coming from: Skilled nursing facility.  Chief Complaint: Fall.  HPI: SRAVANI DREY is a 82 y.o. female with rheumatoid arthritis, hypothyroidism presents to the ER after patient had a fall. Patient states she was walking when her legs gave away. Patient states she did hit her head but did not lose consciousness. Denies any shortness of breath palpitations or chest pain. Has pain in the low back and right rib after the fall. X-rays revealed T8 compression fracture. On call neurosurgeon Dr. Sherwood Gambler was consulted and patient is being admitted for further observation for her.   ED Course: CT head was unremarkable. X-ray of the spine shows T8 compression fracture.  Review of Systems: As per HPI, rest all negative.   Past Medical History:  Diagnosis Date  . Atrial fibrillation ([place])   . Carcinoma ([place])    "top of left leg, had it taken off; right cheek, there now" (11/20/2012)  . Chronic lower back pain    "from a ruptured lower disc" (11/20/2012)  . COPD (chronic obstructive pulmonary disease) ([place])    "not bothered w/it anymore" (11/20/2012)  . Diverticulitis   . Diverticulosis   . GERD (gastroesophageal reflux disease)   . Hiatal hernia   . Hypothyroidism   . Mitral valve prolapse   . Orthostatic hypotension   . Osteoarthritis   . Osteoporosis   . Rheumatoid arthritis(714.0)   . Shingles   . Vertigo     Past Surgical History:  Procedure Laterality Date  . BACK SURGERY    . FIXATION KYPHOPLASTY THORACIC SPINE  ~ 2011   "after falling and breaking 2 vertebra" (11/20/2012)  . SKIN CANCER EXCISION Left ~ 2007   "top of my lower leg" (11/20/2012)  . TENDON REPAIR Right ~ 2009   wrist (11/20/2012)  . TONSILLECTOMY  ~ 1933     reports that she quit smoking about 31 years ago. Her smoking use included Cigarettes. She has a 4.80 pack-year smoking history. She has  never used smokeless tobacco. She reports that she does not drink alcohol or use drugs.  Allergies  Allergen Reactions  . Lactose Intolerance (Gi) Other (See Comments)    Reaction:  GI upset     Family History  Problem Relation Age of Onset  . Cancer Mother     bladder  . Hyperlipidemia Sister   . Stroke Maternal Aunt     Prior to Admission medications   Medication Sig Start Date End Date Taking? Authorizing Provider  acetaminophen (TYLENOL) 325 MG tablet Take 650 mg by mouth every 4 (four) hours as needed for mild pain, moderate pain, fever or headache.    Yes Historical Provider, MD  aspirin 81 MG chewable tablet Chew 81 mg by mouth daily.   Yes Historical Provider, MD  bisacodyl (DULCOLAX) 10 MG suppository Place 10 mg rectally as needed for moderate constipation.   Yes Historical Provider, MD  calcium carbonate (TUMS - DOSED IN MG ELEMENTAL CALCIUM) 500 MG chewable tablet Chew 1 tablet by mouth daily.   Yes Historical Provider, MD  cholecalciferol (VITAMIN D) 1000 units tablet Take 2,000 Units by mouth daily.   Yes Historical Provider, MD  cyanocobalamin 100 MCG tablet Take 100 mcg by mouth daily.   Yes Historical Provider, MD  diclofenac sodium (VOLTAREN) 1 % GEL Apply 2 g topically every 6 (six) hours as needed (for pain).  Yes Historical Provider, MD  folic acid (FOLVITE) 1 MG tablet Take 1 mg by mouth daily.   Yes Historical Provider, MD  gabapentin (NEURONTIN) 100 MG capsule Take 1 capsule (100 mg total) by mouth 3 (three) times daily. Patient taking differently: Take 100 mg by mouth 2 (two) times daily.  07/03/16 08/31/16 Yes Duffy Bruce, MD  hydroxychloroquine (PLAQUENIL) 200 MG tablet Take 200 mg by mouth at bedtime.    Yes Historical Provider, MD  hydroxypropyl methylcellulose / hypromellose (ISOPTO TEARS / GONIOVISC) 2.5 % ophthalmic solution Place 1 drop into the left eye 4 (four) times daily. 07/03/16  Yes Duffy Bruce, MD  levothyroxine (SYNTHROID, LEVOTHROID) 50  MCG tablet Take 50 mcg by mouth daily before breakfast.    Yes Historical Provider, MD  loperamide (IMODIUM) 2 MG capsule Take 2 mg by mouth as needed for diarrhea or loose stools.   Yes Historical Provider, MD  magnesium gluconate (MAGONATE) 500 MG tablet Take 500 mg by mouth 2 (two) times daily.   Yes Historical Provider, MD  magnesium hydroxide (MILK OF MAGNESIA) 400 MG/5ML suspension Take 30 mLs by mouth daily as needed for mild constipation or moderate constipation.    Yes Historical Provider, MD  meclizine (ANTIVERT) 12.5 MG tablet Take 12.5 mg by mouth 3 (three) times daily as needed for dizziness.    Yes Historical Provider, MD  morphine (MS CONTIN) 15 MG 12 hr tablet Take 15 mg by mouth daily.   Yes Historical Provider, MD  oxyCODONE (OXY IR/ROXICODONE) 5 MG immediate release tablet Take 5 mg by mouth every 4 (four) hours as needed for severe pain.   Yes Historical Provider, MD  pantoprazole (PROTONIX) 40 MG tablet Take 40 mg by mouth daily before breakfast.    Yes Historical Provider, MD  polyethylene glycol (MIRALAX / GLYCOLAX) packet Take 17 g by mouth daily as needed for mild constipation or moderate constipation.   Yes Historical Provider, MD  predniSONE (DELTASONE) 5 MG tablet Take 5 mg by mouth daily with breakfast.   Yes Historical Provider, MD  promethazine (PHENERGAN) 25 MG tablet Take 25 mg by mouth every 4 (four) hours as needed for nausea or vomiting.   Yes Historical Provider, MD  senna-docusate (SENOKOT-S) 8.6-50 MG per tablet Take 2 tablets by mouth at bedtime. Pt is able to take an additional tablet during the day if needed.   Yes Historical Provider, MD  sertraline (ZOLOFT) 50 MG tablet Take 75 mg by mouth daily.   Yes Historical Provider, MD  traMADol (ULTRAM) 50 MG tablet Take 50 mg by mouth every 6 (six) hours as needed for moderate pain.   Yes Historical Provider, MD    Physical Exam: Vitals:   08/31/16 0145 08/31/16 0200 08/31/16 0400 08/31/16 0636  BP: 161/93  156/71 145/69 (!) 142/58  Pulse: 75 74 76 74  Resp: 21 19 17 16   Temp:    97.9 F (36.6 C)  TempSrc:    Oral  SpO2: 95% 94% 93% 95%      Constitutional: Moderately built and nourished. Vitals:   08/31/16 0145 08/31/16 0200 08/31/16 0400 08/31/16 0636  BP: 161/93 156/71 145/69 (!) 142/58  Pulse: 75 74 76 74  Resp: 21 19 17 16   Temp:    97.9 F (36.6 C)  TempSrc:    Oral  SpO2: 95% 94% 93% 95%   Eyes: Anicteric no pallor. ENMT: No discharge from the ears eyes nose or mouth. Neck: No mass felt. No neck rigidity. Respiratory: No  rhonchi or crepitations. Cardiovascular: S1-S2 heard. No murmurs appreciated. Abdomen: Soft nontender bowel sounds present. Musculoskeletal: Pain on the right rib. Skin: Chronic skin changes of the lower extremity. Neurologic: Alert awake oriented to time place and person. Moves all extremities. Psychiatric: Appears normal. Normal left.   Labs on Admission: I have personally reviewed following labs and imaging studies  CBC:  Recent Labs Lab 08/30/16 2335  WBC 17.2*  NEUTROABS 13.1*  HGB 12.5  HCT 38.0  MCV 101.6*  PLT [id number]   Basic Metabolic Panel:  Recent Labs Lab 08/30/16 2335  NA 132*  K 3.4*  CL 99*  CO2 22  GLUCOSE 105*  BUN 15  CREATININE 0.79  CALCIUM 8.9   GFR: CrCl cannot be calculated (Unknown ideal weight.). Liver Function Tests:  Recent Labs Lab 08/30/16 2335  AST 34  ALT 28  ALKPHOS 62  BILITOT 0.5  PROT 6.0*  ALBUMIN 3.4*   No results for input(s): LIPASE, AMYLASE in the last 168 hours. No results for input(s): AMMONIA in the last 168 hours. Coagulation Profile: No results for input(s): INR, PROTIME in the last 168 hours. Cardiac Enzymes: No results for input(s): CKTOTAL, CKMB, CKMBINDEX, TROPONINI in the last 168 hours. BNP (last 3 results) No results for input(s): PROBNP in the last 8760 hours. HbA1C: No results for input(s): HGBA1C in the last 72 hours. CBG: No results for input(s): GLUCAP in  the last 168 hours. Lipid Profile: No results for input(s): CHOL, HDL, LDLCALC, TRIG, CHOLHDL, LDLDIRECT in the last 72 hours. Thyroid Function Tests: No results for input(s): TSH, T4TOTAL, FREET4, T3FREE, THYROIDAB in the last 72 hours. Anemia Panel: No results for input(s): VITAMINB12, FOLATE, FERRITIN, TIBC, IRON, RETICCTPCT in the last 72 hours. Urine analysis:    Component Value Date/Time   COLORURINE YELLOW 08/31/2016 0124   APPEARANCEUR CLEAR 08/31/2016 0124   LABSPEC 1.010 08/31/2016 0124   PHURINE 7.0 08/31/2016 0124   GLUCOSEU NEGATIVE 08/31/2016 0124   HGBUR NEGATIVE 08/31/2016 0124   HGBUR trace-intact 05/20/2009 1013   BILIRUBINUR NEGATIVE 08/31/2016 0124   BILIRUBINUR neg 04/08/2011 1455   KETONESUR NEGATIVE 08/31/2016 0124   PROTEINUR NEGATIVE 08/31/2016 0124   UROBILINOGEN 0.2 11/20/2012 1218   NITRITE NEGATIVE 08/31/2016 0124   LEUKOCYTESUR NEGATIVE 08/31/2016 0124   Sepsis Labs: @LABRCNTIP (procalcitonin:4,lacticidven:4) )No results found for this or any previous visit (from the past 240 hour(s)).   Radiological Exams on Admission: Dg Chest 2 View  Result Date: 08/30/2016 CLINICAL DATA:  Witnessed fall.  Chronic left-sided rib pain. EXAM: CHEST  2 VIEW COMPARISON:  CXR dating back through 04/08/2011 FINDINGS: Emphysematous hyperinflation of the lungs. The patient's left hand obscures the left lung base. No pneumothorax or pulmonary consolidation. Heart is top-normal in size. There is aortic atherosclerosis. No CHF. Sclerotic foci of the left humeral head and neck appear chronic dating back to 2012 and are likely benign findings. Vertebral body augmentation at T12. IMPRESSION: Chronic stable emphysematous hyperinflation of the lungs. No acute osseous abnormality. Electronically Signed   By: Ashley Royalty M.D.   On: 08/30/2016 22:57   Dg Thoracic Spine 2 View  Result Date: 08/31/2016 CLINICAL DATA:  Back pain after fall. EXAM: THORACIC SPINE 2 VIEWS COMPARISON:   Chest radiograph 06/15/2016, 10/14/2015 FINDINGS: Mild T8 compression fracture with approximately 50% loss of height anteriorly, new from October 2017. T12 compression fracture with vertebral augmentation, chronic. No additional fracture. No paravertebral soft tissue abnormality. IMPRESSION: Mild T8 compression fracture, new from October 2017. Electronically Signed  By: Jeb Levering M.D.   On: 08/31/2016 03:40   Dg Lumbar Spine Complete  Result Date: 08/31/2016 CLINICAL DATA:  Acute on chronic lumbosacral back pain after fall. EXAM: LUMBAR SPINE - COMPLETE 4+ VIEW COMPARISON:  CT abdomen/ pelvis reformats 04/05/2014 FINDINGS: Moderate dextroscoliotic curvature of the lumbar spine. Associated multilevel degenerative change. Kyphoplasty within T12 compression fracture, remaining lumbar vertebral body heights are preserved. There is anterior wedging of T8 vertebral body in the thoracic spine. Diffuse disc space narrowing and endplate spurring. IMPRESSION: Scoliosis and multilevel degenerative changes the lumbar spine. No acute abnormality. T12 compression fracture with augmentation. T8 compression fracture, assessed on thoracic spine radiographs. Electronically Signed   By: Jeb Levering M.D.   On: 08/31/2016 03:37   Ct Head Wo Contrast  Result Date: 08/31/2016 CLINICAL DATA:  Trip and fall.  No loss of consciousness. EXAM: CT HEAD WITHOUT CONTRAST TECHNIQUE: Contiguous axial images were obtained from the base of the skull through the vertex without intravenous contrast. COMPARISON:  Head CT 11/20/2012 FINDINGS: Brain: Generalized atrophy, stable from prior exam. Stable chronic small vessel ischemia. No intracranial hemorrhage, mass effect, or midline shift. No hydrocephalus. The basilar cisterns are patent. No evidence of territorial infarct. No intracranial fluid collection. Vascular: Atherosclerosis of skullbase vasculature without hyperdense vessel or abnormal calcification. Skull: Normal.  Negative for fracture or focal lesion. Sinuses/Orbits: No acute finding. Other: None. IMPRESSION: No acute intracranial abnormality. Stable atrophy and chronic small vessel ischemia. Electronically Signed   By: Jeb Levering M.D.   On: 08/31/2016 04:38    EKG: Independently reviewed. Normal sinus rhythm. IVCD. Atypical RBB.  Assessment/Plan Principal Problem:   Closed wedge compression fracture of T8 vertebra (HCC) Active Problems:   Hypothyroidism   Rheumatoid arthritis ([place])   T8 vertebral fracture ([place])   Fall    1. T8 compression fracture status post fall - on-call neurosurgeon Dr. Sherwood Gambler will be seeing patient in consult. Neurosurgery has requested TSLO brace. Consult physical therapy continue pain medications. Since patient also has a right rib pain and have ordered x-ray of the right rib series. 2. Rheumatoid arthritis on steroids plaque and no should be continued. 3. Hypothyroidism on Synthroid. 4. Elevated blood pressure - follow blood pressure trends. I have placed patient on when necessary IV hydralazine.  Records mention a history of A. fib but as per cardiologist's notes there was no documented A. fib.   DVT prophylaxis: SCDs. Change to Lovenox if no procedure anticipated. Code Status: DO NOT RESUSCITATE.  Family Communication: Discussed with patient.  Disposition Plan: To be determined.  Consults called: Neurosurgery and physical therapy.  Admission status: Observation.    Rise Patience MD [facility] Pager [phone].  If 7PM-7AM, please contact night-coverage [email or web address] Password [email or web address]  08/31/2016, 7:09 AM

## 2016-08-31 NOTE — Care Management Obs Status (Signed)
[place] NOTIFICATION   Patient Details  Name: [name] MRN: [id number] Date of Birth: 1924-08-03   Medicare Observation Status Notification Given:  Yes    Pollie Friar, RN 08/31/2016, 4:03 PM

## 2016-08-31 NOTE — ED Notes (Signed)
Ortho tech aware of need for TLSO.  Stated someone will come around 9am tomorrow to place the TLSO.

## 2016-08-31 NOTE — Evaluation (Signed)
Physical Therapy Evaluation Patient Details Name: [name] MRN: [id number] DOB: 23-Jun-1924 Today's Date: 08/31/2016   History of Present Illness  82 y.o. female admitted to [facility] on 08/30/16 s/p fall with T8 wedge compression fx and bil LE skin tears.  Pt with significant PMHx of vertigo, RA, orthostatic hypotension, Mitral valve prolapse, COPD, chronic low back pain, A-fib, and thoracic spine kyphoplasty.  Clinical Impression  Pt is limited most by fear of falling, pain is moderate while mobilizing in brace.  Pt is two person assist for safety and due to her fear she tends to freeze and get very anxious, so chair to follow during gait would be helpful.  Pt has had 3 recent falls and ALF is no longer safe for her.  She will need a higher level of care and if she doesn't qualify for a higher level of care, she will need increased care at her ALF as well as f/u PT at discharge.   PT to follow acutely for deficits listed below.       Follow Up Recommendations SNF    Equipment Recommendations  None recommended by PT    Recommendations for Other Services   NA     Precautions / Restrictions Precautions Precautions: Fall Precaution Comments: per RN daughter stated that pt had 3 recent falls Required Braces or Orthoses: Spinal Brace Spinal Brace: Thoracolumbosacral orthotic (no specific orders for where/when to donn, so donned in bed)      Mobility  Bed Mobility Overal bed mobility: Needs Assistance Bed Mobility: Rolling;Sidelying to Sit Rolling: Mod assist Sidelying to sit: Mod assist       General bed mobility comments: Mod assist to support trunk during transitions, rolled several times to get TLSO on in bed and to change adult diaper.  Verbal cues for log roll and pt helping with bil upper extremities on bed rail.   Transfers Overall transfer level: Needs assistance Equipment used: Rolling walker (2 wheeled) Transfers: Sit to/from Stand Sit to Stand: +2 physical  assistance;Mod assist         General transfer comment: Two person mod assist to stand EOB, pt with posterior lean throughout and very fearful of falling, once forward momentum established she did better.   Ambulation/Gait Ambulation/Gait assistance: +2 safety/equipment;Mod assist Ambulation Distance (Feet): 5 Feet Assistive device: Rolling walker (2 wheeled) Gait Pattern/deviations: Step-to pattern;Shuffle;Leaning posteriorly     General Gait Details: Pt very fearful throughout gait with RW. Second person for safety, but not physically assisting.  Backing up to chair particularly hard as it exaggerated her posterior lean.  She stood for ~3 mins while RN changed her LE dressings.          Balance Overall balance assessment: Needs assistance Sitting-balance support: Feet supported;Bilateral upper extremity supported Sitting balance-Leahy Scale: Poor   Postural control: Posterior lean Standing balance support: Bilateral upper extremity supported Standing balance-Leahy Scale: Poor Standing balance comment: posterior lean                             Pertinent Vitals/Pain Pain Assessment: Faces Faces Pain Scale: Hurts even more Pain Location: back and bil LEs where skin tears are  Pain Descriptors / Indicators: Grimacing;Guarding Pain Intervention(s): Limited activity within patient's tolerance;Monitored during session;Repositioned    Home Living Family/patient expects to be discharged to:: Assisted living               Home Equipment: Walker - 4 wheels;Grab bars -  toilet Additional Comments: per pt report she fell while staff were assiting her back to bed. She reports her legs just gave away.     Prior Function Level of Independence: Needs assistance   Gait / Transfers Assistance Needed: assist with rollator  ADL's / Homemaking Assistance Needed: ALF provides all meals (dining hall), administers medication, does all cleaning and laundry        Hand  Dominance   Dominant Hand: Right    Extremity/Trunk Assessment   Upper Extremity Assessment Upper Extremity Assessment: Generalized weakness    Lower Extremity Assessment Lower Extremity Assessment: Generalized weakness    Cervical / Trunk Assessment Cervical / Trunk Assessment: Kyphotic  Communication   Communication: HOH  Cognition Arousal/Alertness: Awake/alert Behavior During Therapy: WFL for tasks assessed/performed Overall Cognitive Status: Within Functional Limits for tasks assessed (pt knew where she was and why, could generally report hx)                             Assessment/Plan    PT Assessment Patient needs continued PT services  PT Problem List Decreased strength;Decreased activity tolerance;Decreased balance;Decreased mobility;Decreased knowledge of precautions;Decreased knowledge of use of DME;Pain          PT Treatment Interventions DME instruction;Gait training;Functional mobility training;Therapeutic activities;Therapeutic exercise;Balance training;Patient/family education    PT Goals (Current goals can be found in the Care Plan section)  Acute Rehab PT Goals Patient Stated Goal: to get stronger and not be afraid of falling PT Goal Formulation: With patient Time For Goal Achievement: 09/07/16 Potential to Achieve Goals: Good    Frequency Min 5X/week (until d/c plan firmed up)           End of Session Equipment Utilized During Treatment: Back brace Activity Tolerance: Patient limited by pain;Other (comment) (limited by fear) Patient left: in chair;with call bell/phone within reach;with chair alarm set Nurse Communication: Mobility status    Functional Assessment Tool Used: assist level Functional Limitation: Mobility: Walking and moving around Mobility: Walking and Moving Around Current Status [id number]): At least 40 percent but less than 60 percent impaired, limited or restricted Mobility: Walking and Moving Around Goal Status  [phone]): At least 1 percent but less than 20 percent impaired, limited or restricted    Time: [id number] PT Time Calculation (min) (ACUTE ONLY): 25 min   Charges:   PT Evaluation $PT Eval Moderate Complexity: 1 Procedure PT Treatments $Therapeutic Activity: 8-22 mins   PT G Codes:   PT G-Codes **NOT FOR INPATIENT CLASS** Functional Assessment Tool Used: assist level Functional Limitation: Mobility: Walking and moving around Mobility: Walking and Moving Around Current Status [id number]): At least 40 percent but less than 60 percent impaired, limited or restricted Mobility: Walking and Moving Around Goal Status [id number]): At least 1 percent but less than 20 percent impaired, limited or restricted    Kess Mcilwain B. Chimene Salo, [place], DPT [phone]   08/31/2016, 5:29 PM

## 2016-08-31 NOTE — Progress Notes (Signed)
Patient tolerated transfer from bed to chair okay.TSLO brace was applied;  Patient ambulated 10 feet. Major complain patient had was fearing she was going to fall.

## 2016-08-31 NOTE — Progress Notes (Signed)
Orthopedic Tech Progress Note Patient Details:  [name] 1924-04-21 [id number]  Patient ID: [name], female   DOB: 03/09/1924, 82 y.o.   MRN: [id number]   [name] 08/31/2016, 10:00 AMCalled [employer] for TLSO Brace.

## 2016-08-31 NOTE — Progress Notes (Signed)
[facility]  Patient examined and H and P reviewed. EMIRA WIGLEY is a 82 y.o. female with rheumatoid arthritis, hypothyroidism presents to the ER from SNF after a fall and is found to have a T 8 compression fx.   Subjective: pain across upper abdomen (per RN, daughter states this is chronic).  Principal Problem:   Closed wedge compression fracture of T8 vertebra  - TSLO brace- kyphoplasty if pain not controlled- NS following- I have spoken with IR- if kyphoplasty needed, IR states they can do it without anaesthesia  - ambulate and follow for pain- cont home dose of MS contin and PRN Oxycodone  Active Problems: Hypokalemia - replace  Leukocytosis - no cough, dysura, diarrhea- may be due to steroids and stress de margination- follow     Hypothyroidism - synthroid    Rheumatoid arthritis - cont home medications  Debbe Odea, MD

## 2016-09-01 ENCOUNTER — Observation Stay (HOSPITAL_COMMUNITY): Payer: Medicare Other

## 2016-09-01 DIAGNOSIS — S22060A Wedge compression fracture of T7-T8 vertebra, initial encounter for closed fracture: Secondary | ICD-10-CM | POA: Diagnosis not present

## 2016-09-01 LAB — BASIC METABOLIC PANEL
ANION GAP: 7 (ref 5–15)
BUN: 11 mg/dL (ref 6–20)
CALCIUM: 9.2 mg/dL (ref 8.9–10.3)
CO2: 27 mmol/L (ref 22–32)
Chloride: 99 mmol/L — ABNORMAL LOW (ref 101–111)
Creatinine, Ser: 0.78 mg/dL (ref 0.44–1.00)
GFR calc Af Amer: >60 mL/min (ref >60)
Glucose, Bld: 100 mg/dL — ABNORMAL HIGH (ref 65–99)
POTASSIUM: 4.6 mmol/L (ref 3.5–5.1)
SODIUM: 133 mmol/L — AB (ref 135–145)

## 2016-09-01 LAB — CBC
HCT: 37.8 % (ref 36.0–46.0)
HEMOGLOBIN: 12.6 g/dL (ref 12.0–15.0)
MCH: 33.5 pg (ref 26.0–34.0)
MCHC: 33.3 g/dL (ref 30.0–36.0)
MCV: 100.5 fL — ABNORMAL HIGH (ref 78.0–100.0)
Platelets: 309 10*3/uL (ref 150–400)
RBC: 3.76 MIL/uL — ABNORMAL LOW (ref 3.87–5.11)
RDW: 14.7 % (ref 11.5–15.5)
WBC: 11.1 10*3/uL — AB (ref 4.0–10.5)

## 2016-09-01 MED ORDER — OXYCODONE HCL 5 MG PO TABS: 5.0000 mg | ORAL_TABLET | ORAL | 0 refills | Status: DC | PRN

## 2016-09-01 MED ORDER — TRAMADOL HCL 50 MG PO TABS: 50.0000 mg | ORAL_TABLET | Freq: Four times a day (QID) | ORAL | 0 refills | Status: DC | PRN

## 2016-09-01 MED ORDER — MORPHINE SULFATE ER 15 MG PO TBCR: 15.0000 mg | EXTENDED_RELEASE_TABLET | Freq: Every day | ORAL | 0 refills | Status: DC

## 2016-09-01 NOTE — Clinical Social Work Note (Signed)
Clinical Social Work Assessment  Patient Details  Name: [name] MRN: [id number] Date of Birth: 1924/02/11  Date of referral:  09/01/16               Reason for consult:  Facility Placement, Discharge Planning                Permission sought to share information with:  Family Supports Permission granted to share information::  Yes, Verbal Permission Granted  Name::     Gita Kudo  Relationship::  daughter  Contact Information:  [phone], [phone]  Housing/Transportation Living arrangements for the past 2 months:  [place] Information:  Adult Children Patient Interpreter Needed:  None Criminal Activity/Legal Involvement Pertinent to Current Situation/Hospitalization:  No - Comment as needed Significant Relationships:  Adult Children Lives with:   Facility Resident Do you feel safe going back to the place where you live?  Yes Need for family participation in patient care:  Yes (Comment)  Care giving concerns:  No care giving concerns identified.   Social Worker assessment / plan:  CSW spoke to pt's daughter to address consult. Pt was sleeping soundly after a test. CSW introduced herself and explained role of social work. Pt's daughter shared that pt is a LTC pt at [facility] Pt is under Medicare OBS, however this will not impede pt's returning. CSW spoke to [facility], and pt is able to return, which may be today. CSW completed FL2. CSW will continue to follow.   Employment status:  Retired Forensic scientist:  Medicare PT Recommendations:  [place] / Referral to community resources:  [place]  Patient/Family's Response to care:  Pt's daughter was Patent attorney of CSW support.   Patient/Family's Understanding of and Emotional Response to Diagnosis, Current Treatment, and Prognosis:  Pt's daughter understands that pt will be returning.   Emotional Assessment Appearance:  Other (Comment  Required Attitude/Demeanor/Rapport:  Other Affect (typically observed):  Other Orientation:  Oriented to Place, Oriented to Self, Oriented to Situation Alcohol / Substance use:  Not Applicable Psych involvement (Current and /or in the community):  No (Comment)  Discharge Needs  Concerns to be addressed:  Discharge Planning Concerns Readmission within the last 30 days:  No Current discharge risk:  Chronically ill Barriers to Discharge:  Continued Medical Work up   [employer], LCSW 09/01/2016, 4:05 PM

## 2016-09-01 NOTE — NC FL2 (Signed)
[place] LEVEL OF CARE SCREENING TOOL     IDENTIFICATION  Patient Name: [name] Birthdate: 1924/04/12 Sex: female Admission Date (Current Location): 08/30/2016  [facility] and [place] Number:  Herbalist and Address:  [facility], [place] [address], [place], [place] [address]      Provider Number: [id number]  Attending Physician Name and Address:  Caren Griffins, MD  Relative Name and Phone Number:       Current Level of Care: Hospital Recommended Level of Care: [place] Prior Approval Number:    Date Approved/Denied:   PASRR Number: [id number]  Discharge Plan: SNF    Current Diagnoses: Patient Active Problem List   Diagnosis Date Noted  . Closed wedge compression fracture of T8 vertebra ([place]) 08/31/2016  . T8 vertebral fracture ([place]) 08/31/2016  . Fall   . Shingles 06/15/2016  . Aspiration into airway   . Pneumonia 10/13/2015  . Aspiration pneumonia ([place]) 10/13/2015  . Sepsis ([place])   . Encephalopathy   . Thyroid activity decreased   . Long term current use of systemic steroids   . Chronic obstructive pulmonary disease ([place])   . Syncope and collapse 11/20/2012  . Hypotension 11/20/2012  . Nausea and vomiting 11/20/2012  . Urinary retention 11/20/2012  . Dysuria R/O UTI 11/20/2012  . Anemia 11/20/2012  . GERD (gastroesophageal reflux disease) 11/20/2012  . Dehydration 01/06/2012  . Gastroenteritis 01/06/2012  . Gait instability 01/06/2012  . Arthritis   . Mitral valve prolapse   . Hiatal hernia   . Diverticulitis   . COPD (chronic obstructive pulmonary disease) ([place])   . Thyroid disease   . Atrial fibrillation ([place])   . Orthostatic hypotension   . Vertigo   . Osteoporosis   . Diverticulosis   . Syncope 08/12/2011  . CARCINOMA, BASAL CELL 06/11/2010  . Rheumatoid arthritis ([place]) 05/22/2009  . Hypothyroidism 05/20/2009  . ABDOMINAL PAIN 05/17/2009    Orientation RESPIRATION  BLADDER Height & Weight     Self, Situation, Place  Normal Incontinent Weight:   Height:     BEHAVIORAL SYMPTOMS/MOOD NEUROLOGICAL BOWEL NUTRITION STATUS      Incontinent Diet (Heart Healthy, Thin Liquids)  AMBULATORY STATUS COMMUNICATION OF NEEDS Skin   Extensive Assist Verbally Normal                       Personal Care Assistance Level of Assistance  Bathing, Feeding, Dressing Bathing Assistance: Limited assistance Feeding assistance: Independent Dressing Assistance: Limited assistance     Functional Limitations Info  Sight, Hearing, Speech Sight Info: Adequate Hearing Info: Adequate Speech Info: Adequate    SPECIAL CARE FACTORS FREQUENCY  PT (By licensed PT)     PT Frequency: 5              Contractures Contractures Info: Not present    Additional Factors Info  Code Status, Allergies, Psychotropic Code Status Info: DNR Allergies Info: Lactose Intolerance (GI) Psychotropic Info: Zoloft         Current Medications (09/01/2016):  This is the current hospital active medication list Current Facility-Administered Medications  Medication Dose Route Frequency Provider Last Rate Last Dose  . acetaminophen (TYLENOL) tablet 650 mg  650 mg Oral Q6H PRN Rise Patience, MD       Or  . acetaminophen (TYLENOL) suppository 650 mg  650 mg Rectal Q6H PRN Rise Patience, MD      . aspirin chewable tablet  81 mg  81 mg Oral Daily Rise Patience, MD   81 mg at 09/01/16 0917  . bisacodyl (DULCOLAX) suppository 10 mg  10 mg Rectal PRN Rise Patience, MD      . calcium carbonate (TUMS - dosed in mg elemental calcium) chewable tablet 200 mg of elemental calcium  1 tablet Oral Daily Rise Patience, MD   200 mg of elemental calcium at 09/01/16 0916  . cholecalciferol (VITAMIN D) tablet 2,000 Units  2,000 Units Oral Daily Rise Patience, MD   2,000 Units at 09/01/16 [phone]  . diclofenac sodium (VOLTAREN) 1 % transdermal gel 2 g  2 g Topical Q6H PRN  Rise Patience, MD      . folic acid (FOLVITE) tablet 1 mg  1 mg Oral Daily Rise Patience, MD   1 mg at 09/01/16 0916  . gabapentin (NEURONTIN) capsule 100 mg  100 mg Oral BID Rise Patience, MD   100 mg at 09/01/16 [id number]  . hydrALAZINE (APRESOLINE) injection 10 mg  10 mg Intravenous Q4H PRN Rise Patience, MD      . hydroxychloroquine (PLAQUENIL) tablet 200 mg  200 mg Oral QHS Rise Patience, MD   200 mg at 08/31/16 2121  . hydroxypropyl methylcellulose / hypromellose (ISOPTO TEARS / GONIOVISC) 2.5 % ophthalmic solution 1 drop  1 drop Left Eye QID Rise Patience, MD   1 drop at 09/01/16 [id number]  . levothyroxine (SYNTHROID, LEVOTHROID) tablet 50 mcg  50 mcg Oral QAC breakfast Rise Patience, MD   50 mcg at 09/01/16 [phone]  . magnesium gluconate (MAGONATE) tablet 500 mg  500 mg Oral BID Rise Patience, MD   500 mg at 09/01/16 0926  . meclizine (ANTIVERT) tablet 12.5 mg  12.5 mg Oral TID PRN Rise Patience, MD      . morphine (MS CONTIN) 12 hr tablet 15 mg  15 mg Oral Daily Rise Patience, MD   15 mg at 09/01/16 0917  . ondansetron (ZOFRAN) tablet 4 mg  4 mg Oral Q6H PRN Rise Patience, MD       Or  . ondansetron [facility]) injection 4 mg  4 mg Intravenous Q6H PRN Rise Patience, MD      . oxyCODONE (Oxy IR/ROXICODONE) immediate release tablet 5 mg  5 mg Oral Q4H PRN Rise Patience, MD   5 mg at 08/31/16 1854  . pantoprazole (PROTONIX) EC tablet 40 mg  40 mg Oral Daily Rise Patience, MD   40 mg at 09/01/16 0917  . polyethylene glycol (MIRALAX / GLYCOLAX) packet 17 g  17 g Oral Daily PRN Rise Patience, MD      . predniSONE (DELTASONE) tablet 5 mg  5 mg Oral Q breakfast Rise Patience, MD   5 mg at 09/01/16 0917  . promethazine (PHENERGAN) tablet 25 mg  25 mg Oral Q4H PRN Rise Patience, MD      . senna-docusate (Senokot-S) tablet 2 tablet  2 tablet Oral QHS Rise Patience, MD   2 tablet at 08/31/16 2121  . sertraline  (ZOLOFT) tablet 75 mg  75 mg Oral Daily Rise Patience, MD   75 mg at 09/01/16 0916  . traMADol (ULTRAM) tablet 50 mg  50 mg Oral Q6H PRN Rise Patience, MD   50 mg at 09/01/16 0917  . vitamin B-12 (CYANOCOBALAMIN) tablet 100 mcg  100 mcg Oral Daily Rise Patience, MD  100 mcg at 09/01/16 [id number]     Discharge Medications: Please see discharge summary for a list of discharge medications.  Relevant Imaging Results:  Relevant Lab Results:   Additional Information SSN:  [id number]  Darden Dates, LCSW

## 2016-09-01 NOTE — Progress Notes (Addendum)
PROGRESS NOTE  [name] [id number] DOB: 08/13/1924 DOA: 08/30/2016 PCP: Willodean Rosenthal, MD   LOS: 0 days   Brief Narrative: 82 y.o. female with rheumatoid arthritis, hypothyroidism presents to the ER after patient had a fall. Patient states she was walking when her legs gave away. Patient states she did hit her head but did not lose consciousness. Denies any shortness of breath palpitations or chest pain. Has pain in the low back and right rib after the fall. X-rays revealed T8 compression fracture. On call neurosurgeon Dr. Sherwood Gambler was consulted and patient is being admitted for further observation  Assessment & Plan: Principal Problem:   Closed wedge compression fracture of T8 vertebra (HCC) Active Problems:   Hypothyroidism   Rheumatoid arthritis ([place])   T8 vertebral fracture ([place])   Fall   Closed wedge compression fracture of T8 vertebra  - DLCO brace fitted yesterday, physical therapy evaluated patient today and recommending SNF level of care. Repeat T-spine x-ray today while standing with brace in place showed "newly seen compression fracture at T8 with loss of height of 50%. Similar to yesterday's exam".  - kyphoplasty if pain not controlled- NS following- Dr. Wynelle Cleveland spoke with IR- if kyphoplasty needed, IR states they can do it without anaesthesia  - discussed with Dr. Sherwood Gambler, appreciate input, plan for DC in am after repeat session with physical therapy. I agree.  Hypokalemia - replaced, normal today   Leukocytosis - no cough, dysura, diarrhea- may be due to steroids and stress de margination - resolved today   Hypothyroidism - synthroid  Rheumatoid arthritis - cont home medications   DVT prophylaxis: SCDs Code Status: DNR Family Communication: no family bedside Disposition Plan: SNF once cleared by neurosurgery   Consultants:   Neurosurgery   Procedures:   None   Antimicrobials:  None    Subjective: - no chest pain, shortness of  breath, no abdominal pain, nausea or vomiting. Complains of back pain  Objective: Vitals:   09/01/16 0129 09/01/16 0514 09/01/16 0930 09/01/16 1340  BP: 138/80 (!) 166/59 (!) 156/62 (!) 157/58  Pulse: 80 72 78 67  Resp: 18 18 20 18   Temp: 97.7 F (36.5 C) 97.7 F (36.5 C) 98 F (36.7 C) 98.1 F (36.7 C)  TempSrc: Oral Oral Oral Oral  SpO2: 92% 95% 97% 95%   No intake or output data in the 24 hours ending 09/01/16 1449 There were no vitals filed for this visit.  Examination: Constitutional: NAD Vitals:   09/01/16 0129 09/01/16 0514 09/01/16 0930 09/01/16 1340  BP: 138/80 (!) 166/59 (!) 156/62 (!) 157/58  Pulse: 80 72 78 67  Resp: 18 18 20 18   Temp: 97.7 F (36.5 C) 97.7 F (36.5 C) 98 F (36.7 C) 98.1 F (36.7 C)  TempSrc: Oral Oral Oral Oral  SpO2: 92% 95% 97% 95%   Respiratory: clear to auscultation bilaterally, no wheezing, no crackles. Cardiovascular: Regular rate and rhythm, no murmurs / rubs / gallops.  Abdomen: no tenderness. Bowel sounds positive.  Musculoskeletal: no clubbing / cyanosis.  Neurologic: non focal    Data Reviewed: I have personally reviewed following labs and imaging studies  CBC:  Recent Labs Lab 08/30/16 2335 09/01/16 0308  WBC 17.2* 11.1*  NEUTROABS 13.1*  --   HGB 12.5 12.6  HCT 38.0 37.8  MCV 101.6* 100.5*  PLT 308 [id number]   Basic Metabolic Panel:  Recent Labs Lab 08/30/16 2335 09/01/16 0308  NA 132* 133*  K 3.4* 4.6  CL 99* 99*  CO2 22 27  GLUCOSE 105* 100*  BUN 15 11  CREATININE 0.79 0.78  CALCIUM 8.9 9.2   GFR: CrCl cannot be calculated (Unknown ideal weight.). Liver Function Tests:  Recent Labs Lab 08/30/16 2335  AST 34  ALT 28  ALKPHOS 62  BILITOT 0.5  PROT 6.0*  ALBUMIN 3.4*   No results for input(s): LIPASE, AMYLASE in the last 168 hours. No results for input(s): AMMONIA in the last 168 hours. Coagulation Profile: No results for input(s): INR, PROTIME in the last 168 hours. Cardiac Enzymes: No  results for input(s): CKTOTAL, CKMB, CKMBINDEX, TROPONINI in the last 168 hours. BNP (last 3 results) No results for input(s): PROBNP in the last 8760 hours. HbA1C: No results for input(s): HGBA1C in the last 72 hours. CBG: No results for input(s): GLUCAP in the last 168 hours. Lipid Profile: No results for input(s): CHOL, HDL, LDLCALC, TRIG, CHOLHDL, LDLDIRECT in the last 72 hours. Thyroid Function Tests: No results for input(s): TSH, T4TOTAL, FREET4, T3FREE, THYROIDAB in the last 72 hours. Anemia Panel: No results for input(s): VITAMINB12, FOLATE, FERRITIN, TIBC, IRON, RETICCTPCT in the last 72 hours. Urine analysis:    Component Value Date/Time   COLORURINE YELLOW 08/31/2016 0124   APPEARANCEUR CLEAR 08/31/2016 0124   LABSPEC 1.010 08/31/2016 0124   PHURINE 7.0 08/31/2016 0124   GLUCOSEU NEGATIVE 08/31/2016 0124   HGBUR NEGATIVE 08/31/2016 0124   HGBUR trace-intact 05/20/2009 1013   BILIRUBINUR NEGATIVE 08/31/2016 0124   BILIRUBINUR neg 04/08/2011 1455   KETONESUR NEGATIVE 08/31/2016 0124   PROTEINUR NEGATIVE 08/31/2016 0124   UROBILINOGEN 0.2 11/20/2012 1218   NITRITE NEGATIVE 08/31/2016 0124   LEUKOCYTESUR NEGATIVE 08/31/2016 0124   Sepsis Labs: Invalid input(s): PROCALCITONIN, LACTICIDVEN  No results found for this or any previous visit (from the past 240 hour(s)).    Radiology Studies: Dg Chest 2 View  Result Date: 08/30/2016 CLINICAL DATA:  Witnessed fall.  Chronic left-sided rib pain. EXAM: CHEST  2 VIEW COMPARISON:  CXR dating back through 04/08/2011 FINDINGS: Emphysematous hyperinflation of the lungs. The patient's left hand obscures the left lung base. No pneumothorax or pulmonary consolidation. Heart is top-normal in size. There is aortic atherosclerosis. No CHF. Sclerotic foci of the left humeral head and neck appear chronic dating back to 2012 and are likely benign findings. Vertebral body augmentation at T12. IMPRESSION: Chronic stable emphysematous  hyperinflation of the lungs. No acute osseous abnormality. Electronically Signed   By: Ashley Royalty M.D.   On: 08/30/2016 22:57   Dg Ribs Unilateral Right  Result Date: 08/31/2016 CLINICAL DATA:  Recent fall, posterolateral right chest and rib pain EXAM: RIGHT RIBS - 2 VIEW COMPARISON:  08/31/2016 FINDINGS: Bones are osteopenic. Right ribs appear intact. No displaced fracture or focal rib abnormality. No chest wall soft tissue swelling or subcutaneous emphysema. Right lung remains clear. No effusion or pneumothorax. Chronic apical scarring noted. Previous vertebral kyphoplasty at T12. Degenerative changes noted of the spine, most pronounced at L2-3. IMPRESSION: No acute rib abnormality. Electronically Signed   By: Jerilynn Mages.  Shick M.D.   On: 08/31/2016 08:51   Dg Thoracic Spine 2 View  Result Date: 09/01/2016 CLINICAL DATA:  Weakness. Unable to stand. History of previous thoracic fractures. EXAM: THORACIC SPINE 2 VIEWS COMPARISON:  08/31/2016.  06/15/2016. FINDINGS: Previous vertebral augmentation at T12. Newly seen compression fracture at T8 with loss of height of 50%. Similar to yesterday's exam. No visible retropulsion. No other thoracic region fracture. IMPRESSION: Newly seen compression fracture at T8 with loss  of height of 50%. Similar to yesterday's exam. Previous vertebral augmentation T12. Electronically Signed   By: Nelson Chimes M.D.   On: 09/01/2016 11:07   Dg Thoracic Spine 2 View  Result Date: 08/31/2016 CLINICAL DATA:  Back pain after fall. EXAM: THORACIC SPINE 2 VIEWS COMPARISON:  Chest radiograph 06/15/2016, 10/14/2015 FINDINGS: Mild T8 compression fracture with approximately 50% loss of height anteriorly, new from October 2017. T12 compression fracture with vertebral augmentation, chronic. No additional fracture. No paravertebral soft tissue abnormality. IMPRESSION: Mild T8 compression fracture, new from October 2017. Electronically Signed   By: Jeb Levering M.D.   On: 08/31/2016 03:40    Dg Lumbar Spine Complete  Result Date: 08/31/2016 CLINICAL DATA:  Acute on chronic lumbosacral back pain after fall. EXAM: LUMBAR SPINE - COMPLETE 4+ VIEW COMPARISON:  CT abdomen/ pelvis reformats 04/05/2014 FINDINGS: Moderate dextroscoliotic curvature of the lumbar spine. Associated multilevel degenerative change. Kyphoplasty within T12 compression fracture, remaining lumbar vertebral body heights are preserved. There is anterior wedging of T8 vertebral body in the thoracic spine. Diffuse disc space narrowing and endplate spurring. IMPRESSION: Scoliosis and multilevel degenerative changes the lumbar spine. No acute abnormality. T12 compression fracture with augmentation. T8 compression fracture, assessed on thoracic spine radiographs. Electronically Signed   By: Jeb Levering M.D.   On: 08/31/2016 03:37   Ct Head Wo Contrast  Result Date: 08/31/2016 CLINICAL DATA:  Trip and fall.  No loss of consciousness. EXAM: CT HEAD WITHOUT CONTRAST TECHNIQUE: Contiguous axial images were obtained from the base of the skull through the vertex without intravenous contrast. COMPARISON:  Head CT 11/20/2012 FINDINGS: Brain: Generalized atrophy, stable from prior exam. Stable chronic small vessel ischemia. No intracranial hemorrhage, mass effect, or midline shift. No hydrocephalus. The basilar cisterns are patent. No evidence of territorial infarct. No intracranial fluid collection. Vascular: Atherosclerosis of skullbase vasculature without hyperdense vessel or abnormal calcification. Skull: Normal. Negative for fracture or focal lesion. Sinuses/Orbits: No acute finding. Other: None. IMPRESSION: No acute intracranial abnormality. Stable atrophy and chronic small vessel ischemia. Electronically Signed   By: Jeb Levering M.D.   On: 08/31/2016 04:38     Scheduled Meds: . aspirin  81 mg Oral Daily  . calcium carbonate  1 tablet Oral Daily  . cholecalciferol  2,000 Units Oral Daily  . folic acid  1 mg Oral  Daily  . gabapentin  100 mg Oral BID  . hydroxychloroquine  200 mg Oral QHS  . hydroxypropyl methylcellulose / hypromellose  1 drop Left Eye QID  . levothyroxine  50 mcg Oral QAC breakfast  . magnesium gluconate  500 mg Oral BID  . morphine  15 mg Oral Daily  . pantoprazole  40 mg Oral Daily  . predniSONE  5 mg Oral Q breakfast  . senna-docusate  2 tablet Oral QHS  . sertraline  75 mg Oral Daily  . cyanocobalamin  100 mcg Oral Daily   Continuous Infusions:  Marzetta Board, MD, PhD [facility] Pager [phone]  If 7PM-7AM, please contact night-coverage [email or web address] Password [facility] 09/01/2016, 2:49 PM

## 2016-09-01 NOTE — Progress Notes (Signed)
Physical Therapy Treatment Patient Details Name: [name] MRN: [id number] DOB: 1924-02-15 Today's Date: 09/01/2016    History of Present Illness 82 y.o. female admitted to [facility] on 08/30/16 s/p fall with T8 wedge compression fx and bil LE skin tears.  Pt with significant PMHx of vertigo, RA, orthostatic hypotension, Mitral valve prolapse, COPD, chronic low back pain, A-fib, and thoracic spine kyphoplasty.    PT Comments    Patient initially reluctant to participate with PT due to fear of pain and fear of falling. Ultimately was grateful to have been up and moving. Continues to require 2 person assist for donning brace in supine, to come to sit at EOB, to transfer to standing, and to ambulate with RW. Patient has h/o knees buckling and 3 recent falls. Continue to feel she will need higher level of care (was in Assisted Living) as she currently requires 2 person assist for mobility.   Follow Up Recommendations  SNF     Equipment Recommendations  None recommended by PT    Recommendations for Other Services OT consult     Precautions / Restrictions Precautions Precautions: Fall Precaution Comments: per RN daughter stated that pt had 3 recent falls Required Braces or Orthoses: Spinal Brace Spinal Brace: Thoracolumbosacral orthotic (no specific orders for where/when to donn, so donned in bed)    Mobility  Bed Mobility Overal bed mobility: Needs Assistance Bed Mobility: Rolling;Sidelying to Sit Rolling: Mod assist Sidelying to sit: Mod assist;+2 for physical assistance;+2 for safety/equipment       General bed mobility comments: rolled x 3 for changing linens (incontinent of urine prior to arrival) and donning brace; initially required assist to bend legs up to facilitate rolling; pt using UEs on rails to assist  Transfers Overall transfer level: Needs assistance Equipment used: Rolling walker (2 wheeled) Transfers: Sit to/from Stand Sit to Stand: +2 physical assistance;Mod  assist         General transfer comment: Two person mod assist to stand EOB, pt with posterior lean throughout and very fearful of falling, once forward momentum established she did better.   Ambulation/Gait Ambulation/Gait assistance: +2 safety/equipment;Mod assist Ambulation Distance (Feet): 10 Feet Assistive device: Rolling walker (2 wheeled) Gait Pattern/deviations: Step-to pattern;Leaning posteriorly;Decreased stride length;Wide base of support Gait velocity: slow   General Gait Details: Initial significant posterior lean with 2 person assist; as progressively shifted anterior over her BOS, she progressed to one person assist (for balance and maneuvering RW) with +2 for safety (h/o knees buckling)   Stairs            Wheelchair Mobility    Modified Rankin (Stroke Patients Only)       Balance Overall balance assessment: Needs assistance Sitting-balance support: Feet supported;Bilateral upper extremity supported Sitting balance-Leahy Scale: Zero Sitting balance - Comments: posterior lean Postural control: Posterior lean Standing balance support: Bilateral upper extremity supported Standing balance-Leahy Scale: Zero Standing balance comment: posterior lean                    Cognition Arousal/Alertness: Awake/alert Behavior During Therapy: Anxious Overall Cognitive Status: Within Functional Limits for tasks assessed (pt knew where she was and why, could generally report hx)                 General Comments: reported she was up in chair until 9:30 pm, however NT reports back to bed prior to 7pm    Exercises      General Comments General comments (skin integrity, edema,  etc.): Patient initially refusing adamantly despite explanation for rationale. Stated she wanted to wait and work with PT at [facility]. She asked PT to call her daughter [name]) and daughter able to convince her to participate.      Pertinent Vitals/Pain Pain Assessment:  Faces Faces Pain Scale: Hurts even more Pain Location: back and around sides Pain Descriptors / Indicators: Grimacing;Guarding;Radiating Pain Intervention(s): Limited activity within patient's tolerance;Monitored during session;Repositioned;Patient requesting pain meds-RN notified    Home Living                      Prior Function            PT Goals (current goals can now be found in the care plan section) Acute Rehab PT Goals Patient Stated Goal: to get stronger and not be afraid of falling Time For Goal Achievement: 09/07/16 Progress towards PT goals: Progressing toward goals    Frequency    Min 5X/week (until d/c plan firmed up)      PT Plan Current plan remains appropriate    Co-evaluation             End of Session Equipment Utilized During Treatment: Back brace;Gait belt Activity Tolerance: Patient limited by pain;Other (comment) (limited by fear) Patient left: with call bell/phone within reach;in bed;with bed alarm set;with nursing/sitter in room     Time: 0835-0919 PT Time Calculation (min) (ACUTE ONLY): 44 min  Charges:  $Gait Training: 8-22 mins $Therapeutic Activity: 8-22 mins $Self Care/Home Management: 8-22                    G Codes:      [name] [name] 09/18/16, 9:35 AM Pager [phone]

## 2016-09-01 NOTE — Progress Notes (Signed)
Subjective: Patient resting in bed, comfortable. Has been working with PT and OT. TLSO at bedside. Upright x-rays today showed no further loss of T8 vertebral body height.  Objective: Vital signs in last 24 hours: Vitals:   09/01/16 0129 09/01/16 0514 09/01/16 0930 09/01/16 1340  BP: 138/80 (!) 166/59 (!) 156/62 (!) 157/58  Pulse: 80 72 78 67  Resp: 18 18 20 18   Temp: 97.7 F (36.5 C) 97.7 F (36.5 C) 98 F (36.7 C) 98.1 F (36.7 C)  TempSrc: Oral Oral Oral Oral  SpO2: 92% 95% 97% 95%    Intake/Output from previous day: 12/26 0701 - 12/27 0700 In: 60 [P.O.:60] Out: -  Intake/Output this shift: No intake/output data recorded.  Physical Exam:  Awake, following commands. Moving all 4 extremities well.  CBC  Recent Labs  08/30/16 2335 09/01/16 0308  WBC 17.2* 11.1*  HGB 12.5 12.6  HCT 38.0 37.8  PLT 308 309   BMET  Recent Labs  08/30/16 2335 09/01/16 0308  NA 132* 133*  K 3.4* 4.6  CL 99* 99*  CO2 22 27  GLUCOSE 105* 100*  BUN 15 11  CREATININE 0.79 0.78  CALCIUM 8.9 9.2    Studies/Results: Dg Chest 2 View  Result Date: 08/30/2016 CLINICAL DATA:  Witnessed fall.  Chronic left-sided rib pain. EXAM: CHEST  2 VIEW COMPARISON:  CXR dating back through 04/08/2011 FINDINGS: Emphysematous hyperinflation of the lungs. The patient's left hand obscures the left lung base. No pneumothorax or pulmonary consolidation. Heart is top-normal in size. There is aortic atherosclerosis. No CHF. Sclerotic foci of the left humeral head and neck appear chronic dating back to 2012 and are likely benign findings. Vertebral body augmentation at T12. IMPRESSION: Chronic stable emphysematous hyperinflation of the lungs. No acute osseous abnormality. Electronically Signed   By: Ashley Royalty M.D.   On: 08/30/2016 22:57   Dg Ribs Unilateral Right  Result Date: 08/31/2016 CLINICAL DATA:  Recent fall, posterolateral right chest and rib pain EXAM: RIGHT RIBS - 2 VIEW COMPARISON:  08/31/2016  FINDINGS: Bones are osteopenic. Right ribs appear intact. No displaced fracture or focal rib abnormality. No chest wall soft tissue swelling or subcutaneous emphysema. Right lung remains clear. No effusion or pneumothorax. Chronic apical scarring noted. Previous vertebral kyphoplasty at T12. Degenerative changes noted of the spine, most pronounced at L2-3. IMPRESSION: No acute rib abnormality. Electronically Signed   By: Jerilynn Mages.  Shick M.D.   On: 08/31/2016 08:51   Dg Thoracic Spine 2 View  Result Date: 09/01/2016 CLINICAL DATA:  Weakness. Unable to stand. History of previous thoracic fractures. EXAM: THORACIC SPINE 2 VIEWS COMPARISON:  08/31/2016.  06/15/2016. FINDINGS: Previous vertebral augmentation at T12. Newly seen compression fracture at T8 with loss of height of 50%. Similar to yesterday's exam. No visible retropulsion. No other thoracic region fracture. IMPRESSION: Newly seen compression fracture at T8 with loss of height of 50%. Similar to yesterday's exam. Previous vertebral augmentation T12. Electronically Signed   By: Nelson Chimes M.D.   On: 09/01/2016 11:07   Dg Thoracic Spine 2 View  Result Date: 08/31/2016 CLINICAL DATA:  Back pain after fall. EXAM: THORACIC SPINE 2 VIEWS COMPARISON:  Chest radiograph 06/15/2016, 10/14/2015 FINDINGS: Mild T8 compression fracture with approximately 50% loss of height anteriorly, new from October 2017. T12 compression fracture with vertebral augmentation, chronic. No additional fracture. No paravertebral soft tissue abnormality. IMPRESSION: Mild T8 compression fracture, new from October 2017. Electronically Signed   By: Jeb Levering M.D.   On:  08/31/2016 03:40   Dg Lumbar Spine Complete  Result Date: 08/31/2016 CLINICAL DATA:  Acute on chronic lumbosacral back pain after fall. EXAM: LUMBAR SPINE - COMPLETE 4+ VIEW COMPARISON:  CT abdomen/ pelvis reformats 04/05/2014 FINDINGS: Moderate dextroscoliotic curvature of the lumbar spine. Associated multilevel  degenerative change. Kyphoplasty within T12 compression fracture, remaining lumbar vertebral body heights are preserved. There is anterior wedging of T8 vertebral body in the thoracic spine. Diffuse disc space narrowing and endplate spurring. IMPRESSION: Scoliosis and multilevel degenerative changes the lumbar spine. No acute abnormality. T12 compression fracture with augmentation. T8 compression fracture, assessed on thoracic spine radiographs. Electronically Signed   By: Jeb Levering M.D.   On: 08/31/2016 03:37   Ct Head Wo Contrast  Result Date: 08/31/2016 CLINICAL DATA:  Trip and fall.  No loss of consciousness. EXAM: CT HEAD WITHOUT CONTRAST TECHNIQUE: Contiguous axial images were obtained from the base of the skull through the vertex without intravenous contrast. COMPARISON:  Head CT 11/20/2012 FINDINGS: Brain: Generalized atrophy, stable from prior exam. Stable chronic small vessel ischemia. No intracranial hemorrhage, mass effect, or midline shift. No hydrocephalus. The basilar cisterns are patent. No evidence of territorial infarct. No intracranial fluid collection. Vascular: Atherosclerosis of skullbase vasculature without hyperdense vessel or abnormal calcification. Skull: Normal. Negative for fracture or focal lesion. Sinuses/Orbits: No acute finding. Other: None. IMPRESSION: No acute intracranial abnormality. Stable atrophy and chronic small vessel ischemia. Electronically Signed   By: Jeb Levering M.D.   On: 08/31/2016 04:38    Assessment/Plan: Patient stable, continuing to work with PT and OT. Good pain control at this time. I do not feel that she needs a kyphoplasty at this time.  Favor further work with PT and OT tomorrow AM, prior to returning to [employer] at [facility] (her SNF).  She will need to continue with PT and OT at [facility].  A question is whether she should be going initially to the rehabilitation unit, rather than the SNF unit at [facility].    She will  need, with assistance, to donn and doff her TLSO in bed, and mobilize, transfer, and ambulate with the TLSO on. She needs to follow-up with me in the office [phone]) in about 1 month, with AP and lateral thoracic spine x-rays, to be done in my office.   Hosie Spangle, MD 09/01/2016, 4:37 PM

## 2016-09-02 DIAGNOSIS — S22060D Wedge compression fracture of T7-T8 vertebra, subsequent encounter for fracture with routine healing: Secondary | ICD-10-CM | POA: Diagnosis not present

## 2016-09-02 DIAGNOSIS — S22060A Wedge compression fracture of T7-T8 vertebra, initial encounter for closed fracture: Secondary | ICD-10-CM | POA: Diagnosis not present

## 2016-09-02 LAB — MRSA PCR SCREENING: MRSA BY PCR: NEGATIVE

## 2016-09-02 NOTE — Clinical Social Work Note (Signed)
Patient medically stable for discharge back to [facility] skilled facility, where she is a LTC resident. Discharge clinicals transmitted to facility. Admissions director, Arna Medici contacted and advised of discharge. Daughter [name] contacted [phone]) and informed of discharge and ambulance transport.  Abbigael Detlefsen Givens, MSW, LCSW Licensed Clinical Social Worker [place] [phone]

## 2016-09-02 NOTE — Care Management Note (Signed)
Case Management Note  Patient Details  Name: KEELIE LINDSETH MRN: [id number] Date of Birth: 1923/11/20  Subjective/Objective:                    Action/Plan: Pt discharging back to [facility] today. No further needs per CM.   Expected Discharge Date:  09/04/16               Expected Discharge Plan:  [place]  In-House Referral:     Discharge planning Services     Post Acute Care Choice:    Choice offered to:     DME Arranged:    DME Agency:     HH Arranged:    Rew Agency:     Status of Service:  Completed, signed off  If discussed at [employer] [facility] [employer], dates discussed:    Additional Comments:  Pollie Friar, RN 09/02/2016, 10:30 AM

## 2016-09-02 NOTE — Discharge Summary (Signed)
Physician Discharge Summary  [name] [id number] DOB: [birth year] DOA: 08/30/2016  PCP: Lauren Rosenthal, MD  Admit date: 08/30/2016 Discharge date: 09/02/2016  Admitted From: SNF Disposition:  SNF  Recommendations for Outpatient Follow-up:  1. Follow up with PCP in 1-2 weeks  Discharge Condition: stable CODE STATUS: DNR Diet recommendation: regular  HPI: Per Dr. Glyn Neal, [name] is a 82 y.o. female with rheumatoid arthritis, hypothyroidism presents to the ER after patient had a fall. Patient states she was walking when her legs gave away. Patient states she did hit her head but did not lose consciousness. Denies any shortness of breath palpitations or chest pain. Has pain in the low back and right rib after the fall. X-rays revealed T8 compression fracture. On call neurosurgeon Dr. Sherwood Neal was consulted and patient is being admitted for further observation for her. ED Course: CT head was unremarkable. X-ray of the spine shows T8 compression fracture.  Hospital Course: Discharge Diagnoses:  Principal Problem:   Closed wedge compression fracture of T8 vertebra (HCC) Active Problems:   Hypothyroidism   Rheumatoid arthritis ([place])   T8 vertebral fracture (HCC)   Fall  Closed wedge compression fracture of T8 vertebra - neurosurgery consulted and have followed patient while hospitalized, discussed with Dr. Sherwood Neal. She had DLCO brace fitted, physical therapy evaluated patient and recommended SNF level of care. Patient underwent a repeat T-spine x-ray while standing with brace in place showed "newly seen compression fracture at T8 with loss of height of 50%. Similar to yesterday's exam". Neurosurgery did not recommend kyphoplasty at this time and will need a 1 month follow up in office.  Hypokalemia - replaced, normal now Leukocytosis - no cough, dysura, diarrhea- may be due to steroids and stress de margination,  resolved Hypothyroidism - synthroid Rheumatoid arthritis  - cont home medications   Discharge Instructions   Allergies as of 09/02/2016      Reactions   Lactose Intolerance (gi) Other (See Comments)   Reaction:  GI upset       Medication List    TAKE these medications   acetaminophen 325 MG tablet Commonly known as:  TYLENOL Take 650 mg by mouth every 4 (four) hours as needed for mild pain, moderate pain, fever or headache.   aspirin 81 MG chewable tablet Chew 81 mg by mouth daily.   bisacodyl 10 MG suppository Commonly known as:  DULCOLAX Place 10 mg rectally as needed for moderate constipation.   calcium carbonate 500 MG chewable tablet Commonly known as:  TUMS - dosed in mg elemental calcium Chew 1 tablet by mouth daily.   cholecalciferol 1000 units tablet Commonly known as:  VITAMIN D Take 2,000 Units by mouth daily.   cyanocobalamin 100 MCG tablet Take 100 mcg by mouth daily.   diclofenac sodium 1 % Gel Commonly known as:  VOLTAREN Apply 2 g topically every 6 (six) hours as needed (for pain).   folic acid 1 MG tablet Commonly known as:  FOLVITE Take 1 mg by mouth daily.   gabapentin 100 MG capsule Commonly known as:  NEURONTIN Take 1 capsule (100 mg total) by mouth 3 (three) times daily. What changed:  when to take this   hydroxychloroquine 200 MG tablet Commonly known as:  PLAQUENIL Take 200 mg by mouth at bedtime.   hydroxypropyl methylcellulose / hypromellose 2.5 % ophthalmic solution Commonly known as:  ISOPTO TEARS / GONIOVISC Place 1 drop into the left eye 4 (four) times daily.   levothyroxine 50 MCG  tablet Commonly known as:  SYNTHROID, LEVOTHROID Take 50 mcg by mouth daily before breakfast.   loperamide 2 MG capsule Commonly known as:  IMODIUM Take 2 mg by mouth as needed for diarrhea or loose stools.   magnesium gluconate 500 MG tablet Commonly known as:  MAGONATE Take 500 mg by mouth 2 (two) times daily.   magnesium hydroxide 400 MG/5ML suspension Commonly known as:  MILK OF  MAGNESIA Take 30 mLs by mouth daily as needed for mild constipation or moderate constipation.   meclizine 12.5 MG tablet Commonly known as:  ANTIVERT Take 12.5 mg by mouth 3 (three) times daily as needed for dizziness.   morphine 15 MG 12 hr tablet Commonly known as:  MS CONTIN Take 1 tablet (15 mg total) by mouth daily.   oxyCODONE 5 MG immediate release tablet Commonly known as:  Oxy IR/ROXICODONE Take 1 tablet (5 mg total) by mouth every 4 (four) hours as needed for severe pain.   pantoprazole 40 MG tablet Commonly known as:  PROTONIX Take 40 mg by mouth daily before breakfast.   polyethylene glycol packet Commonly known as:  MIRALAX / GLYCOLAX Take 17 g by mouth daily as needed for mild constipation or moderate constipation.   predniSONE 5 MG tablet Commonly known as:  DELTASONE Take 5 mg by mouth daily with breakfast.   promethazine 25 MG tablet Commonly known as:  PHENERGAN Take 25 mg by mouth every 4 (four) hours as needed for nausea or vomiting.   senna-docusate 8.6-50 MG tablet Commonly known as:  Senokot-S Take 2 tablets by mouth at bedtime. Pt is able to take an additional tablet during the day if needed.   sertraline 50 MG tablet Commonly known as:  ZOLOFT Take 75 mg by mouth daily.   traMADol 50 MG tablet Commonly known as:  ULTRAM Take 1 tablet (50 mg total) by mouth every 6 (six) hours as needed for moderate pain.       Contact information for follow-up providers    Hosie Spangle, MD. Schedule an appointment as soon as possible for a visit in 2 week(s).   Specialty:  Neurosurgery Contact information: [address] [place]  [address] [phone]            Contact information for after-discharge care    Destination    [facility] Follow up.   Specialties:  [place], [place] Contact information: [place]  [address] [phone]                 Allergies  Allergen Reactions  . Lactose Intolerance (Gi) Other (See Comments)    Reaction:  GI upset     Consultations:  Neurosurgery   Procedures/Studies:  Dg Chest 2 View  Result Date: 08/30/2016 CLINICAL DATA:  Witnessed fall.  Chronic left-sided rib pain. EXAM: CHEST  2 VIEW COMPARISON:  CXR dating back through 04/08/2011 FINDINGS: Emphysematous hyperinflation of the lungs. The patient's left hand obscures the left lung base. No pneumothorax or pulmonary consolidation. Heart is top-normal in size. There is aortic atherosclerosis. No CHF. Sclerotic foci of the left humeral head and neck appear chronic dating back to 2012 and are likely benign findings. Vertebral body augmentation at T12. IMPRESSION: Chronic stable emphysematous hyperinflation of the lungs. No acute osseous abnormality. Electronically Signed   By: Ashley Royalty M.D.   On: 08/30/2016 22:57   Dg Ribs Unilateral Right  Result Date: 08/31/2016 CLINICAL  DATA:  Recent fall, posterolateral right chest and rib pain EXAM: RIGHT RIBS - 2 VIEW COMPARISON:  08/31/2016 FINDINGS: Bones are osteopenic. Right ribs appear intact. No displaced fracture or focal rib abnormality. No chest wall soft tissue swelling or subcutaneous emphysema. Right lung remains clear. No effusion or pneumothorax. Chronic apical scarring noted. Previous vertebral kyphoplasty at T12. Degenerative changes noted of the spine, most pronounced at L2-3. IMPRESSION: No acute rib abnormality. Electronically Signed   By: Jerilynn Mages.  Shick M.D.   On: 08/31/2016 08:51   Dg Thoracic Spine 2 View  Result Date: 09/01/2016 CLINICAL DATA:  Weakness. Unable to stand. History of previous thoracic fractures. EXAM: THORACIC SPINE 2 VIEWS COMPARISON:  08/31/2016.  06/15/2016. FINDINGS: Previous vertebral augmentation at T12. Newly seen compression fracture at T8 with loss of height of 50%. Similar to yesterday's exam. No visible retropulsion. No  other thoracic region fracture. IMPRESSION: Newly seen compression fracture at T8 with loss of height of 50%. Similar to yesterday's exam. Previous vertebral augmentation T12. Electronically Signed   By: Nelson Chimes M.D.   On: 09/01/2016 11:07   Dg Thoracic Spine 2 View  Result Date: 08/31/2016 CLINICAL DATA:  Back pain after fall. EXAM: THORACIC SPINE 2 VIEWS COMPARISON:  Chest radiograph 06/15/2016, 10/14/2015 FINDINGS: Mild T8 compression fracture with approximately 50% loss of height anteriorly, new from October 2017. T12 compression fracture with vertebral augmentation, chronic. No additional fracture. No paravertebral soft tissue abnormality. IMPRESSION: Mild T8 compression fracture, new from October 2017. Electronically Signed   By: Jeb Levering M.D.   On: 08/31/2016 03:40   Dg Lumbar Spine Complete  Result Date: 08/31/2016 CLINICAL DATA:  Acute on chronic lumbosacral back pain after fall. EXAM: LUMBAR SPINE - COMPLETE 4+ VIEW COMPARISON:  CT abdomen/ pelvis reformats 04/05/2014 FINDINGS: Moderate dextroscoliotic curvature of the lumbar spine. Associated multilevel degenerative change. Kyphoplasty within T12 compression fracture, remaining lumbar vertebral body heights are preserved. There is anterior wedging of T8 vertebral body in the thoracic spine. Diffuse disc space narrowing and endplate spurring. IMPRESSION: Scoliosis and multilevel degenerative changes the lumbar spine. No acute abnormality. T12 compression fracture with augmentation. T8 compression fracture, assessed on thoracic spine radiographs. Electronically Signed   By: Jeb Levering M.D.   On: 08/31/2016 03:37   Ct Head Wo Contrast  Result Date: 08/31/2016 CLINICAL DATA:  Trip and fall.  No loss of consciousness. EXAM: CT HEAD WITHOUT CONTRAST TECHNIQUE: Contiguous axial images were obtained from the base of the skull through the vertex without intravenous contrast. COMPARISON:  Head CT 11/20/2012 FINDINGS: Brain:  Generalized atrophy, stable from prior exam. Stable chronic small vessel ischemia. No intracranial hemorrhage, mass effect, or midline shift. No hydrocephalus. The basilar cisterns are patent. No evidence of territorial infarct. No intracranial fluid collection. Vascular: Atherosclerosis of skullbase vasculature without hyperdense vessel or abnormal calcification. Skull: Normal. Negative for fracture or focal lesion. Sinuses/Orbits: No acute finding. Other: None. IMPRESSION: No acute intracranial abnormality. Stable atrophy and chronic small vessel ischemia. Electronically Signed   By: Jeb Levering M.D.   On: 08/31/2016 04:38      Subjective: - no chest pain, shortness of breath, no abdominal pain, nausea or vomiting. Mild back pain  Discharge Exam: Vitals:   09/02/16 0529 09/02/16 0915  BP: (!) 150/51 (!) 103/53  Pulse: 77 82  Resp: 18 18  Temp: 97.8 F (36.6 C) 97.9 F (36.6 C)   Vitals:   09/01/16 2130 09/02/16 0145 09/02/16 0529 09/02/16 0915  BP: 118/68 (!) 138/59 [name])  150/51 (!) 103/53  Pulse: 91 81 77 82  Resp: 16 18 18 18   Temp: 97.9 F (36.6 C) 97.4 F (36.3 C) 97.8 F (36.6 C) 97.9 F (36.6 C)  TempSrc: Oral Oral Oral Oral  SpO2: 94% 92% 95% 94%    General: Pt is alert, awake, not in acute distress Cardiovascular: RRR, S1/S2 +, no rubs, no gallops Respiratory: CTA bilaterally, no wheezing, no rhonchi Abdominal: Soft, NT, ND, bowel sounds +    The results of significant diagnostics from this hospitalization (including imaging, microbiology, ancillary and laboratory) are listed below for reference.     Microbiology: Recent Results (from the past 240 hour(s))  MRSA PCR Screening     Status: None   Collection Time: 09/02/16  4:04 AM  Result Value Ref Range Status   MRSA by PCR NEGATIVE NEGATIVE Final    Comment:        The GeneXpert MRSA Assay (FDA approved for NASAL specimens only), is one component of a comprehensive MRSA colonization surveillance  program. It is not intended to diagnose MRSA infection nor to guide or monitor treatment for MRSA infections.      Labs: BNP (last 3 results) No results for input(s): BNP in the last 8760 hours. Basic Metabolic Panel:  Recent Labs Lab 08/30/16 2335 09/01/16 0308  NA 132* 133*  K 3.4* 4.6  CL 99* 99*  CO2 22 27  GLUCOSE 105* 100*  BUN 15 11  CREATININE 0.79 0.78  CALCIUM 8.9 9.2   Liver Function Tests:  Recent Labs Lab 08/30/16 2335  AST 34  ALT 28  ALKPHOS 62  BILITOT 0.5  PROT 6.0*  ALBUMIN 3.4*   No results for input(s): LIPASE, AMYLASE in the last 168 hours. No results for input(s): AMMONIA in the last 168 hours. CBC:  Recent Labs Lab 08/30/16 2335 09/01/16 0308  WBC 17.2* 11.1*  NEUTROABS 13.1*  --   HGB 12.5 12.6  HCT 38.0 37.8  MCV 101.6* 100.5*  PLT 308 309   Cardiac Enzymes: No results for input(s): CKTOTAL, CKMB, CKMBINDEX, TROPONINI in the last 168 hours. BNP: Invalid input(s): POCBNP CBG: No results for input(s): GLUCAP in the last 168 hours. D-Dimer No results for input(s): DDIMER in the last 72 hours. Hgb A1c No results for input(s): HGBA1C in the last 72 hours. Lipid Profile No results for input(s): CHOL, HDL, LDLCALC, TRIG, CHOLHDL, LDLDIRECT in the last 72 hours. Thyroid function studies No results for input(s): TSH, T4TOTAL, T3FREE, THYROIDAB in the last 72 hours.  Invalid input(s): FREET3 Anemia work up No results for input(s): VITAMINB12, FOLATE, FERRITIN, TIBC, IRON, RETICCTPCT in the last 72 hours. Urinalysis    Component Value Date/Time   COLORURINE YELLOW 08/31/2016 0124   APPEARANCEUR CLEAR 08/31/2016 0124   LABSPEC 1.010 08/31/2016 0124   PHURINE 7.0 08/31/2016 0124   GLUCOSEU NEGATIVE 08/31/2016 0124   HGBUR NEGATIVE 08/31/2016 0124   HGBUR trace-intact 05/20/2009 1013   BILIRUBINUR NEGATIVE 08/31/2016 0124   BILIRUBINUR neg 04/08/2011 1455   KETONESUR NEGATIVE 08/31/2016 0124   PROTEINUR NEGATIVE 08/31/2016  0124   UROBILINOGEN 0.2 11/20/2012 1218   NITRITE NEGATIVE 08/31/2016 0124   LEUKOCYTESUR NEGATIVE 08/31/2016 0124   Sepsis Labs Invalid input(s): PROCALCITONIN,  WBC,  LACTICIDVEN Microbiology Recent Results (from the past 240 hour(s))  MRSA PCR Screening     Status: None   Collection Time: 09/02/16  4:04 AM  Result Value Ref Range Status   MRSA by PCR NEGATIVE NEGATIVE Final  Comment:        The GeneXpert MRSA Assay (FDA approved for NASAL specimens only), is one component of a comprehensive MRSA colonization surveillance program. It is not intended to diagnose MRSA infection nor to guide or monitor treatment for MRSA infections.     Time coordinating discharge: Over 30 minutes  SIGNED:  Marzetta Board, MD  [facility] 09/02/2016, 9:46 AM Pager [phone]  If 7PM-7AM, please contact night-coverage [email or web address] Password [email or web address]

## 2016-09-02 NOTE — Progress Notes (Signed)
Physical Therapy Treatment Patient Details Name: [name] MRN: [id number] DOB: 29-May-1924 Today's Date: 09/02/2016    History of Present Illness 82 y.o. female admitted [facility] on 08/30/16 s/p fall with T8 wedge compression fx and bil LE skin tears.  Pt with significant PMHx of vertigo, RA, orthostatic hypotension, Mitral valve prolapse, COPD, chronic low back pain, A-fib, and thoracic spine kyphoplasty.    PT Comments    Primary focus of session on education regarding use of brace, rationale for brace, risks of inactivity (pt refusing to don brace or attempt OOB), and positioning for comfort. Patient reported, "If I was 82 or even 80 yrs old, I could maybe do this. But I'm 82 years old and I'm not going to." RN made aware.   Follow Up Recommendations  SNF     Equipment Recommendations  None recommended by PT    Recommendations for Other Services       Precautions / Restrictions Precautions Precautions: Fall Precaution Comments: per RN daughter stated that pt had 3 recent falls Required Braces or Orthoses: Spinal Brace Spinal Brace: Thoracolumbosacral orthotic;Applied in supine position (Neurosurgery note 12/28 clarified don/doff in supine) Restrictions Weight Bearing Restrictions: No    Mobility  Bed Mobility Overal bed mobility: Needs Assistance Bed Mobility: Rolling Rolling: Mod assist         General bed mobility comments: pt attempted to roll without assist and unable; assisted with bed pad for comfort  Transfers                    Ambulation/Gait                 Stairs            Wheelchair Mobility    Modified Rankin (Stroke Patients Only)       Balance                                    Cognition Arousal/Alertness: Awake/alert Behavior During Therapy: Anxious Overall Cognitive Status: Within Functional Limits for tasks assessed (pt able to clearly state her wishes and risks of inactivity)                      Exercises      General Comments General comments (skin integrity, edema, etc.): Lengthy discussion re: when to wear brace (pt thought because it was removed yesterday, she no longer needed to wear it). Educated on risks of inactivity (especially pneumonia, blood clots, increasing weakness, loss of independence), and pt adamant re: not donning brace and not mobilizing. Educated on positioning for comfort and patient chose to try sidelying with pillow between her knees.       Pertinent Vitals/Pain Pain Assessment: Faces Faces Pain Scale: Hurts whole lot Pain Location: back and around sides Pain Descriptors / Indicators: Grimacing;Guarding;Radiating Pain Intervention(s): Limited activity within patient's tolerance;Premedicated before session;Monitored during session;Repositioned    Home Living                      Prior Function            PT Goals (current goals can now be found in the care plan section) Acute Rehab PT Goals Patient Stated Goal: to be comfortable Time For Goal Achievement: 09/07/16 Progress towards PT goals: Not progressing toward goals - comment    Frequency    Min 3X/week (plan  now to return to SNF)      PT Plan Current plan remains appropriate (continue therapies if patient consents)    Co-evaluation             End of Session   Activity Tolerance: Patient limited by pain;Other (comment) (limited by fear of pain) Patient left: with call bell/phone within reach;in bed;with bed alarm set     Time: [id number] PT Time Calculation (min) (ACUTE ONLY): 22 min  Charges:  $Therapeutic Activity: 8-22 mins                    G CodesJeanie Cooks Jariana Shumard 10/02/2016, 11:24 AM Pager [phone]

## 2016-09-13 ENCOUNTER — Inpatient Hospital Stay (HOSPITAL_COMMUNITY): Payer: Medicare Other

## 2016-09-13 ENCOUNTER — Encounter (HOSPITAL_COMMUNITY): Payer: Self-pay | Admitting: Emergency Medicine

## 2016-09-13 ENCOUNTER — Emergency Department (HOSPITAL_COMMUNITY): Payer: Medicare Other

## 2016-09-13 ENCOUNTER — Inpatient Hospital Stay (HOSPITAL_COMMUNITY)
Admission: EM | Admit: 2016-09-13 | Discharge: 2016-09-16 | DRG: 871 | Disposition: A | Discharge status: Skilled Nursing Facility | Payer: Medicare Other | Attending: Internal Medicine | Admitting: Internal Medicine

## 2016-09-13 DIAGNOSIS — Z7982 Long term (current) use of aspirin: Secondary | ICD-10-CM

## 2016-09-13 DIAGNOSIS — Y95 Nosocomial condition: Secondary | ICD-10-CM | POA: Diagnosis present

## 2016-09-13 DIAGNOSIS — M4854XD Collapsed vertebra, not elsewhere classified, thoracic region, subsequent encounter for fracture with routine healing: Secondary | ICD-10-CM | POA: Diagnosis present

## 2016-09-13 DIAGNOSIS — Z993 Dependence on wheelchair: Secondary | ICD-10-CM | POA: Diagnosis not present

## 2016-09-13 DIAGNOSIS — J449 Chronic obstructive pulmonary disease, unspecified: Secondary | ICD-10-CM | POA: Diagnosis present

## 2016-09-13 DIAGNOSIS — E039 Hypothyroidism, unspecified: Secondary | ICD-10-CM | POA: Diagnosis present

## 2016-09-13 DIAGNOSIS — R296 Repeated falls: Secondary | ICD-10-CM | POA: Diagnosis present

## 2016-09-13 DIAGNOSIS — J189 Pneumonia, unspecified organism: Secondary | ICD-10-CM | POA: Diagnosis not present

## 2016-09-13 DIAGNOSIS — K449 Diaphragmatic hernia without obstruction or gangrene: Secondary | ICD-10-CM | POA: Diagnosis present

## 2016-09-13 DIAGNOSIS — Z7189 Other specified counseling: Secondary | ICD-10-CM

## 2016-09-13 DIAGNOSIS — Z91011 Allergy to milk products: Secondary | ICD-10-CM

## 2016-09-13 DIAGNOSIS — E876 Hypokalemia: Secondary | ICD-10-CM | POA: Diagnosis present

## 2016-09-13 DIAGNOSIS — K5790 Diverticulosis of intestine, part unspecified, without perforation or abscess without bleeding: Secondary | ICD-10-CM | POA: Diagnosis present

## 2016-09-13 DIAGNOSIS — R739 Hyperglycemia, unspecified: Secondary | ICD-10-CM | POA: Diagnosis present

## 2016-09-13 DIAGNOSIS — K219 Gastro-esophageal reflux disease without esophagitis: Secondary | ICD-10-CM | POA: Diagnosis present

## 2016-09-13 DIAGNOSIS — J690 Pneumonitis due to inhalation of food and vomit: Secondary | ICD-10-CM | POA: Diagnosis present

## 2016-09-13 DIAGNOSIS — I4510 Unspecified right bundle-branch block: Secondary | ICD-10-CM | POA: Diagnosis present

## 2016-09-13 DIAGNOSIS — I341 Nonrheumatic mitral (valve) prolapse: Secondary | ICD-10-CM | POA: Diagnosis present

## 2016-09-13 DIAGNOSIS — R1310 Dysphagia, unspecified: Secondary | ICD-10-CM | POA: Diagnosis present

## 2016-09-13 DIAGNOSIS — Z79899 Other long term (current) drug therapy: Secondary | ICD-10-CM

## 2016-09-13 DIAGNOSIS — S22060D Wedge compression fracture of T7-T8 vertebra, subsequent encounter for fracture with routine healing: Secondary | ICD-10-CM | POA: Diagnosis not present

## 2016-09-13 DIAGNOSIS — R1084 Generalized abdominal pain: Secondary | ICD-10-CM | POA: Diagnosis present

## 2016-09-13 DIAGNOSIS — R109 Unspecified abdominal pain: Secondary | ICD-10-CM | POA: Diagnosis present

## 2016-09-13 DIAGNOSIS — R509 Fever, unspecified: Secondary | ICD-10-CM

## 2016-09-13 DIAGNOSIS — T380X5A Adverse effect of glucocorticoids and synthetic analogues, initial encounter: Secondary | ICD-10-CM | POA: Diagnosis present

## 2016-09-13 DIAGNOSIS — I959 Hypotension, unspecified: Secondary | ICD-10-CM | POA: Diagnosis present

## 2016-09-13 DIAGNOSIS — Z66 Do not resuscitate: Secondary | ICD-10-CM | POA: Diagnosis present

## 2016-09-13 DIAGNOSIS — M069 Rheumatoid arthritis, unspecified: Secondary | ICD-10-CM | POA: Diagnosis present

## 2016-09-13 DIAGNOSIS — K869 Disease of pancreas, unspecified: Secondary | ICD-10-CM

## 2016-09-13 DIAGNOSIS — A419 Sepsis, unspecified organism: Principal | ICD-10-CM

## 2016-09-13 DIAGNOSIS — I9589 Other hypotension: Secondary | ICD-10-CM | POA: Diagnosis not present

## 2016-09-13 DIAGNOSIS — I480 Paroxysmal atrial fibrillation: Secondary | ICD-10-CM | POA: Diagnosis present

## 2016-09-13 DIAGNOSIS — Z7952 Long term (current) use of systemic steroids: Secondary | ICD-10-CM

## 2016-09-13 DIAGNOSIS — F329 Major depressive disorder, single episode, unspecified: Secondary | ICD-10-CM | POA: Diagnosis present

## 2016-09-13 DIAGNOSIS — R54 Age-related physical debility: Secondary | ICD-10-CM | POA: Diagnosis present

## 2016-09-13 DIAGNOSIS — Z515 Encounter for palliative care: Secondary | ICD-10-CM | POA: Diagnosis not present

## 2016-09-13 DIAGNOSIS — S22060A Wedge compression fracture of T7-T8 vertebra, initial encounter for closed fracture: Secondary | ICD-10-CM | POA: Diagnosis present

## 2016-09-13 DIAGNOSIS — Z87891 Personal history of nicotine dependence: Secondary | ICD-10-CM | POA: Diagnosis not present

## 2016-09-13 DIAGNOSIS — R112 Nausea with vomiting, unspecified: Secondary | ICD-10-CM | POA: Diagnosis not present

## 2016-09-13 DIAGNOSIS — R197 Diarrhea, unspecified: Secondary | ICD-10-CM

## 2016-09-13 DIAGNOSIS — I4891 Unspecified atrial fibrillation: Secondary | ICD-10-CM | POA: Diagnosis present

## 2016-09-13 DIAGNOSIS — E162 Hypoglycemia, unspecified: Secondary | ICD-10-CM | POA: Diagnosis not present

## 2016-09-13 LAB — CBC WITH DIFFERENTIAL/PLATELET
BASOS ABS: 0 10*3/uL (ref 0.0–0.1)
BASOS ABS: 0 10*3/uL (ref 0.0–0.1)
BASOS PCT: 0 %
BASOS PCT: 0 %
EOS PCT: 0 %
Eosinophils Absolute: 0 10*3/uL (ref 0.0–0.7)
Eosinophils Absolute: 0 10*3/uL (ref 0.0–0.7)
Eosinophils Relative: 0 %
HCT: 31.9 % — ABNORMAL LOW (ref 36.0–46.0)
HEMATOCRIT: 42.5 % (ref 36.0–46.0)
Hemoglobin: 13.7 g/dL (ref 12.0–15.0)
Hemoglobin: 10.5 g/dL — ABNORMAL LOW (ref 12.0–15.0)
LYMPHS PCT: 9 %
LYMPHS PCT: 11 %
Lymphs Abs: 0.6 10*3/uL — ABNORMAL LOW (ref 0.7–4.0)
Lymphs Abs: 1.1 10*3/uL (ref 0.7–4.0)
MCH: 33.3 pg (ref 26.0–34.0)
MCH: 33.4 pg (ref 26.0–34.0)
MCHC: 32.2 g/dL (ref 30.0–36.0)
MCHC: 32.9 g/dL (ref 30.0–36.0)
MCV: 103.2 fL — AB (ref 78.0–100.0)
MCV: 101.6 fL — AB (ref 78.0–100.0)
MONO ABS: 0.2 10*3/uL (ref 0.1–1.0)
MONO ABS: 0.5 10*3/uL (ref 0.1–1.0)
Monocytes Relative: 3 %
Monocytes Relative: 5 %
NEUTROS ABS: 6.4 10*3/uL (ref 1.7–7.7)
NEUTROS PCT: 88 %
Neutro Abs: 8.1 10*3/uL — ABNORMAL HIGH (ref 1.7–7.7)
Neutrophils Relative %: 83 %
PLATELETS: 427 10*3/uL — AB (ref 150–400)
PLATELETS: 312 10*3/uL (ref 150–400)
RBC: 4.12 MIL/uL (ref 3.87–5.11)
RBC: 3.14 MIL/uL — AB (ref 3.87–5.11)
RDW: 15.1 % (ref 11.5–15.5)
RDW: 15 % (ref 11.5–15.5)
WBC: 7.2 10*3/uL (ref 4.0–10.5)
WBC: 9.8 10*3/uL (ref 4.0–10.5)

## 2016-09-13 LAB — COMPREHENSIVE METABOLIC PANEL
ALBUMIN: 3.6 g/dL (ref 3.5–5.0)
ALBUMIN: 2.4 g/dL — AB (ref 3.5–5.0)
ALT: 20 U/L (ref 14–54)
ALT: 17 U/L (ref 14–54)
ANION GAP: 15 (ref 5–15)
AST: 37 U/L (ref 15–41)
AST: 24 U/L (ref 15–41)
Alkaline Phosphatase: 110 U/L (ref 38–126)
Alkaline Phosphatase: 78 U/L (ref 38–126)
Anion gap: 7 (ref 5–15)
BUN: 22 mg/dL — ABNORMAL HIGH (ref 6–20)
BUN: 12 mg/dL (ref 6–20)
CALCIUM: 8.6 mg/dL — AB (ref 8.9–10.3)
CHLORIDE: 110 mmol/L (ref 101–111)
CO2: 24 mmol/L (ref 22–32)
CO2: 21 mmol/L — AB (ref 22–32)
CREATININE: 0.7 mg/dL (ref 0.44–1.00)
Calcium: 7.3 mg/dL — ABNORMAL LOW (ref 8.9–10.3)
Chloride: 95 mmol/L — ABNORMAL LOW (ref 101–111)
Creatinine, Ser: 0.94 mg/dL (ref 0.44–1.00)
GFR calc Af Amer: >60 mL/min (ref >60)
GFR, EST NON AFRICAN AMERICAN: 52 mL/min — AB (ref >60)
GLUCOSE: 181 mg/dL — AB (ref 65–99)
GLUCOSE: 98 mg/dL (ref 65–99)
POTASSIUM: 4.1 mmol/L (ref 3.5–5.1)
POTASSIUM: 2.9 mmol/L — AB (ref 3.5–5.1)
SODIUM: 138 mmol/L (ref 135–145)
Sodium: 134 mmol/L — ABNORMAL LOW (ref 135–145)
Total Bilirubin: 1.2 mg/dL (ref 0.3–1.2)
Total Bilirubin: 0.5 mg/dL (ref 0.3–1.2)
Total Protein: 6.3 g/dL — ABNORMAL LOW (ref 6.5–8.1)
Total Protein: 4.7 g/dL — ABNORMAL LOW (ref 6.5–8.1)

## 2016-09-13 LAB — CBG MONITORING, ED: GLUCOSE-CAPILLARY: 95 mg/dL (ref 65–99)

## 2016-09-13 LAB — PROTIME-INR
INR: 1.14
Prothrombin Time: 14.7 seconds (ref 11.4–15.2)

## 2016-09-13 LAB — I-STAT CG4 LACTIC ACID, ED
LACTIC ACID, VENOUS: 2.84 mmol/L — AB (ref 0.5–1.9)
Lactic Acid, Venous: 3.14 mmol/L (ref 0.5–1.9)

## 2016-09-13 LAB — URINALYSIS, ROUTINE W REFLEX MICROSCOPIC
BACTERIA UA: NONE SEEN
Bilirubin Urine: NEGATIVE
GLUCOSE, UA: NEGATIVE mg/dL
HGB URINE DIPSTICK: NEGATIVE
Ketones, ur: 5 mg/dL — AB
LEUKOCYTES UA: NEGATIVE
NITRITE: NEGATIVE
PH: 6 (ref 5.0–8.0)
Protein, ur: 30 mg/dL — AB
Specific Gravity, Urine: 1.019 (ref 1.005–1.030)
Squamous Epithelial / HPF: NONE SEEN

## 2016-09-13 LAB — C DIFFICILE QUICK SCREEN W PCR REFLEX
C DIFFICILE (CDIFF) INTERP: NOT DETECTED
C DIFFICLE (CDIFF) ANTIGEN: NEGATIVE
C Diff toxin: NEGATIVE

## 2016-09-13 LAB — I-STAT TROPONIN, ED: Troponin i, poc: 0.01 ng/mL (ref 0.00–0.08)

## 2016-09-13 LAB — LACTIC ACID, PLASMA: LACTIC ACID, VENOUS: 0.8 mmol/L (ref 0.5–1.9)

## 2016-09-13 LAB — APTT: APTT: 29 s (ref 24–36)

## 2016-09-13 LAB — PROCALCITONIN: Procalcitonin: 7.73 ng/mL

## 2016-09-13 LAB — STREP PNEUMONIAE URINARY ANTIGEN: STREP PNEUMO URINARY ANTIGEN: NEGATIVE

## 2016-09-13 LAB — LIPASE, BLOOD: Lipase: 12 U/L (ref 11–51)

## 2016-09-13 MED ORDER — PANTOPRAZOLE SODIUM 40 MG PO TBEC
40.0000 mg | DELAYED_RELEASE_TABLET | Freq: Every day | ORAL | Status: DC
  Administered 2016-09-14 – 2016-09-16 (×3): 40 mg via ORAL
  Filled 2016-09-13 (×3): qty 1

## 2016-09-13 MED ORDER — FENTANYL CITRATE (PF) 100 MCG/2ML IJ SOLN
25.0000 ug | Freq: Once | INTRAMUSCULAR | Status: AC
  Administered 2016-09-13: 25 ug via INTRAVENOUS
  Filled 2016-09-13: qty 2

## 2016-09-13 MED ORDER — GADOBENATE DIMEGLUMINE 529 MG/ML IV SOLN
10.0000 mL | Freq: Once | INTRAVENOUS | Status: AC
  Administered 2016-09-13: 10 mL via INTRAVENOUS

## 2016-09-13 MED ORDER — VANCOMYCIN HCL IN DEXTROSE 1-5 GM/200ML-% IV SOLN
1000.0000 mg | Freq: Once | INTRAVENOUS | Status: AC
  Administered 2016-09-13: 1000 mg via INTRAVENOUS
  Filled 2016-09-13: qty 200

## 2016-09-13 MED ORDER — ACETAMINOPHEN 650 MG RE SUPP
650.0000 mg | Freq: Four times a day (QID) | RECTAL | Status: DC | PRN
  Administered 2016-09-13: 650 mg via RECTAL
  Filled 2016-09-13: qty 1

## 2016-09-13 MED ORDER — SERTRALINE HCL 50 MG PO TABS: 75.0000 mg | ORAL_TABLET | Freq: Every day | ORAL | Status: DC

## 2016-09-13 MED ORDER — SODIUM CHLORIDE 0.9 % IV BOLUS (SEPSIS)
250.0000 mL | Freq: Once | INTRAVENOUS | Status: AC
  Administered 2016-09-13: 250 mL via INTRAVENOUS

## 2016-09-13 MED ORDER — MAGNESIUM CITRATE PO SOLN: 1.0000 | Freq: Once | ORAL | Status: DC | PRN

## 2016-09-13 MED ORDER — LEVOTHYROXINE SODIUM 50 MCG PO TABS
50.0000 ug | ORAL_TABLET | Freq: Every day | ORAL | Status: DC
  Administered 2016-09-14 – 2016-09-16 (×3): 50 ug via ORAL
  Filled 2016-09-13 (×4): qty 1

## 2016-09-13 MED ORDER — PREDNISONE 5 MG PO TABS
5.0000 mg | ORAL_TABLET | Freq: Every day | ORAL | Status: DC
  Administered 2016-09-14 – 2016-09-16 (×3): 5 mg via ORAL
  Filled 2016-09-13 (×4): qty 1

## 2016-09-13 MED ORDER — SODIUM CHLORIDE 0.9 % IV BOLUS (SEPSIS)
1000.0000 mL | Freq: Once | INTRAVENOUS | Status: AC
  Administered 2016-09-13: 1000 mL via INTRAVENOUS

## 2016-09-13 MED ORDER — INSULIN ASPART 100 UNIT/ML ~~LOC~~ SOLN
0.0000 [IU] | Freq: Three times a day (TID) | SUBCUTANEOUS | Status: DC
Start: 2016-09-13 — End: 2016-09-14

## 2016-09-13 MED ORDER — ONDANSETRON HCL 4 MG/2ML IJ SOLN
4.0000 mg | Freq: Four times a day (QID) | INTRAMUSCULAR | Status: DC | PRN
  Administered 2016-09-14: 4 mg via INTRAVENOUS
  Filled 2016-09-13: qty 2

## 2016-09-13 MED ORDER — IOPAMIDOL (ISOVUE-300) INJECTION 61%
INTRAVENOUS | Status: AC
  Administered 2016-09-13: 75 mL
  Filled 2016-09-13: qty 75

## 2016-09-13 MED ORDER — OXYCODONE HCL 5 MG PO TABS: 5.0000 mg | ORAL_TABLET | ORAL | Status: DC | PRN

## 2016-09-13 MED ORDER — FOLIC ACID 1 MG PO TABS
1.0000 mg | ORAL_TABLET | Freq: Every day | ORAL | Status: DC
  Administered 2016-09-14 – 2016-09-16 (×3): 1 mg via ORAL
  Filled 2016-09-13 (×3): qty 1

## 2016-09-13 MED ORDER — SENNOSIDES-DOCUSATE SODIUM 8.6-50 MG PO TABS: 1.0000 | ORAL_TABLET | Freq: Every evening | ORAL | Status: DC | PRN

## 2016-09-13 MED ORDER — ONDANSETRON HCL 4 MG PO TABS: 4.0000 mg | ORAL_TABLET | Freq: Four times a day (QID) | ORAL | Status: DC | PRN

## 2016-09-13 MED ORDER — SODIUM CHLORIDE 0.9 % IV BOLUS (SEPSIS)
500.0000 mL | Freq: Once | INTRAVENOUS | Status: AC
  Administered 2016-09-13: 500 mL via INTRAVENOUS

## 2016-09-13 MED ORDER — ACETAMINOPHEN 650 MG RE SUPP
650.0000 mg | Freq: Once | RECTAL | Status: AC
  Administered 2016-09-13: 650 mg via RECTAL
  Filled 2016-09-13: qty 1

## 2016-09-13 MED ORDER — ASPIRIN 81 MG PO CHEW
81.0000 mg | CHEWABLE_TABLET | Freq: Every day | ORAL | Status: DC
  Administered 2016-09-14 – 2016-09-16 (×3): 81 mg via ORAL
  Filled 2016-09-13 (×3): qty 1

## 2016-09-13 MED ORDER — MECLIZINE HCL 25 MG PO TABS: 12.5000 mg | ORAL_TABLET | Freq: Three times a day (TID) | ORAL | Status: DC | PRN

## 2016-09-13 MED ORDER — ONDANSETRON HCL 4 MG/2ML IJ SOLN
4.0000 mg | Freq: Once | INTRAMUSCULAR | Status: AC
  Administered 2016-09-13: 4 mg via INTRAVENOUS
  Filled 2016-09-13: qty 2

## 2016-09-13 MED ORDER — VITAMIN D 1000 UNITS PO TABS
2000.0000 [IU] | ORAL_TABLET | Freq: Every day | ORAL | Status: DC
  Administered 2016-09-14: 2000 [IU] via ORAL
  Filled 2016-09-13: qty 2

## 2016-09-13 MED ORDER — IPRATROPIUM-ALBUTEROL 0.5-2.5 (3) MG/3ML IN SOLN: 3.0000 mL | Freq: Four times a day (QID) | RESPIRATORY_TRACT | Status: DC | PRN

## 2016-09-13 MED ORDER — MORPHINE SULFATE ER 15 MG PO TBCR: 15.0000 mg | EXTENDED_RELEASE_TABLET | Freq: Every day | ORAL | Status: DC

## 2016-09-13 MED ORDER — TRAZODONE HCL 50 MG PO TABS
25.0000 mg | ORAL_TABLET | Freq: Every evening | ORAL | Status: DC | PRN
  Administered 2016-09-14: 25 mg via ORAL
  Filled 2016-09-13: qty 1

## 2016-09-13 MED ORDER — CEFEPIME HCL 2 G IJ SOLR
2.0000 g | INTRAMUSCULAR | Status: DC
  Administered 2016-09-13 – 2016-09-14 (×2): 2 g via INTRAVENOUS
  Filled 2016-09-13 (×2): qty 2

## 2016-09-13 MED ORDER — GABAPENTIN 100 MG PO CAPS
100.0000 mg | ORAL_CAPSULE | Freq: Three times a day (TID) | ORAL | Status: DC
  Administered 2016-09-14: 100 mg via ORAL
  Filled 2016-09-13: qty 1

## 2016-09-13 MED ORDER — VITAMIN B-12 100 MCG PO TABS
100.0000 ug | ORAL_TABLET | Freq: Every day | ORAL | Status: DC
  Administered 2016-09-14 – 2016-09-16 (×3): 100 ug via ORAL
  Filled 2016-09-13 (×3): qty 1

## 2016-09-13 MED ORDER — HYDROXYCHLOROQUINE SULFATE 200 MG PO TABS
200.0000 mg | ORAL_TABLET | Freq: Every day | ORAL | Status: DC
  Administered 2016-09-14 – 2016-09-15 (×3): 200 mg via ORAL
  Filled 2016-09-13 (×4): qty 1

## 2016-09-13 MED ORDER — ENOXAPARIN SODIUM 40 MG/0.4ML ~~LOC~~ SOLN: 40.0000 mg | SUBCUTANEOUS | Status: DC

## 2016-09-13 MED ORDER — ACETAMINOPHEN 325 MG PO TABS: 650.0000 mg | ORAL_TABLET | Freq: Four times a day (QID) | ORAL | Status: DC | PRN

## 2016-09-13 MED ORDER — SODIUM CHLORIDE 0.9 % IV SOLN
INTRAVENOUS | Status: DC
  Administered 2016-09-13 – 2016-09-14 (×2): via INTRAVENOUS

## 2016-09-13 MED ORDER — PIPERACILLIN-TAZOBACTAM 3.375 G IVPB 30 MIN
3.3750 g | Freq: Once | INTRAVENOUS | Status: AC
  Administered 2016-09-13: 3.375 g via INTRAVENOUS
  Filled 2016-09-13: qty 50

## 2016-09-13 MED ORDER — HYPROMELLOSE 0.5 % OP SOLN: 1.0000 [drp] | Freq: Four times a day (QID) | OPHTHALMIC | Status: DC

## 2016-09-13 MED ORDER — BISACODYL 10 MG RE SUPP: 10.0000 mg | Freq: Every day | RECTAL | Status: DC | PRN

## 2016-09-13 MED ORDER — SODIUM CHLORIDE 0.9 % IV SOLN
1000.0000 mL | INTRAVENOUS | Status: DC
  Administered 2016-09-13: 1000 mL via INTRAVENOUS

## 2016-09-13 MED ORDER — VANCOMYCIN HCL 500 MG IV SOLR
500.0000 mg | Freq: Two times a day (BID) | INTRAVENOUS | Status: DC
  Administered 2016-09-14: 500 mg via INTRAVENOUS
  Filled 2016-09-13 (×3): qty 500

## 2016-09-13 MED ORDER — ARTIFICIAL TEARS OP OINT
1.0000 "application " | TOPICAL_OINTMENT | Freq: Every day | OPHTHALMIC | Status: DC
  Administered 2016-09-14 – 2016-09-16 (×3): 1 via OPHTHALMIC
  Filled 2016-09-13 (×2): qty 3.5

## 2016-09-13 NOTE — ED Triage Notes (Signed)
Pt presents via [facility] from [facility].  Began vomiting at 3pm today.  Was given a phenergan suppository by staff.  Pt complains of rib pain and abdominal pain as well.  4 of Zofran IV given en route.

## 2016-09-13 NOTE — ED Notes (Signed)
Medications are not available at this time

## 2016-09-13 NOTE — ED Notes (Signed)
CBG 95 reported to [facility]

## 2016-09-13 NOTE — H&P (Signed)
History and Physical    [name] [id number] DOB: 01/18/1924 DOA: 09/13/2016   PCP: Willodean Rosenthal, MD   Patient coming from:  SNF   Chief Complaint: Nausea, vomiting, diarrhea, cough   HPI: [name] is a 83 y.o. female with medical history significant for Afib, GERD, hypothyroidism, RA on plaquenil and prednisone, COPD, recent admission to the hospital for T8 vertebral compression fracture from 12/25 to 12/2, brought to the ED with acute onset of nausea, vomiting, diarrhea, as well as chills without subjective fevers. History is obtained by chart, as the patient is not responsive at this moment. The patient complained of diffuse abdominal pain and presentation. Per chart, she denied any chest pain or headaches, or shortness of breath . Notother history is available at this time.    ED Course:  BP 128/70   Pulse 104   Temp 101.3 F (38.5 C) (Rectal)   Resp (!) 27   SpO2 99%    on presentation, she was tachycardic and was hypotensive   chest x-ray and CT of the abdomen and pelvis shows incidental  bilateral pneumonia. Of note, Daisy low-density seen at the pancreatic head, which could represent pancreatitis versus pancreatic necrosis, but normal LFTs and lipase. Received 2 l IVF, currently at 125 cc/h Vanco and Zosyn IV given. She received IV antiemetics  as well. Pancultures pending  EKG on sinus tachycardia without ACS. UA is negative.  Sodium 134 potassium 4.1 creatinine 0.94 lipase 12 AST 37 ALT 20 total bili  1.2 troponin 0.01 lactic acid 3.14, now 2.84 white count 7.2 platelets 12/25/2015 hemoglobin 13.7 GLu 181   Review of Systems: As per HPI otherwise 10 point review of systems negative.   Past Medical History:  Diagnosis Date  . Atrial fibrillation ([place])   . Carcinoma ([place])    "top of left leg, had it taken off; right cheek, there now" (11/20/2012)  . Chronic lower back pain    "from a ruptured lower disc" (11/20/2012)  . COPD (chronic obstructive pulmonary  disease) ([place])    "not bothered w/it anymore" (11/20/2012)  . Diverticulitis   . Diverticulosis   . GERD (gastroesophageal reflux disease)   . Hiatal hernia   . Hypothyroidism   . Mitral valve prolapse   . Orthostatic hypotension   . Osteoarthritis   . Osteoporosis   . Rheumatoid arthritis(714.0)   . Shingles   . Vertigo     Past Surgical History:  Procedure Laterality Date  . BACK SURGERY    . FIXATION KYPHOPLASTY THORACIC SPINE  ~ 2011   "after falling and breaking 2 vertebra" (11/20/2012)  . SKIN CANCER EXCISION Left ~ 2007   "top of my lower leg" (11/20/2012)  . TENDON REPAIR Right ~ 2009   wrist (11/20/2012)  . TONSILLECTOMY  ~ 42    Social History Social History   Social History  . Marital status: Widowed    Spouse name: N/A  . Number of children: N/A  . Years of education: N/A   Occupational History  . Not on file.   Social History Main Topics  . Smoking status: Former Smoker    Packs/day: 0.12    Years: 40.00    Types: Cigarettes    Quit date: 09/06/1985  . Smokeless tobacco: Never Used     Comment: 11/20/2012 "stopped smoking 27 yr ago"  . Alcohol use No  . Drug use: No  . Sexual activity: No   Other Topics Concern  . Not on file  Social History Narrative  . No narrative on file     Allergies  Allergen Reactions  . Lactose Intolerance (Gi) Other (See Comments)    Reaction:  GI upset     Family History  Problem Relation Age of Onset  . Cancer Mother     bladder  . Hyperlipidemia Sister   . Stroke Maternal Aunt       Prior to Admission medications   Medication Sig Start Date End Date Taking? Authorizing Provider  acetaminophen (TYLENOL) 325 MG tablet Take 650 mg by mouth every 4 (four) hours as needed for mild pain, moderate pain, fever or headache.    Yes Historical Provider, MD  artificial tears (LACRILUBE) OINT ophthalmic ointment Place 1 application into the left eye daily.   Yes Historical Provider, MD  aspirin 81 MG chewable  tablet Chew 81 mg by mouth daily.   Yes Historical Provider, MD  bisacodyl (DULCOLAX) 10 MG suppository Place 10 mg rectally every three (3) days as needed for moderate constipation.    Yes Historical Provider, MD  calcium carbonate (TUMS - DOSED IN MG ELEMENTAL CALCIUM) 500 MG chewable tablet Chew 1 tablet by mouth daily.   Yes Historical Provider, MD  cholecalciferol (VITAMIN D) 1000 units tablet Take 2,000 Units by mouth daily.   Yes Historical Provider, MD  cyanocobalamin 100 MCG tablet Take 100 mcg by mouth daily.   Yes Historical Provider, MD  diclofenac sodium (VOLTAREN) 1 % GEL Apply 2 g topically every 6 (six) hours as needed (for pain).    Yes Historical Provider, MD  folic acid (FOLVITE) 1 MG tablet Take 1 mg by mouth daily.   Yes Historical Provider, MD  gabapentin (NEURONTIN) 100 MG capsule Take 1 capsule (100 mg total) by mouth 3 (three) times daily. 07/03/16 09/13/16 Yes Duffy Bruce, MD  hydroxychloroquine (PLAQUENIL) 200 MG tablet Take 200 mg by mouth at bedtime.    Yes Historical Provider, MD  hydroxypropyl methylcellulose / hypromellose (ISOPTO TEARS / GONIOVISC) 2.5 % ophthalmic solution Place 1 drop into the left eye 4 (four) times daily. Patient taking differently: Place 1 drop into both eyes every 2 (two) hours while awake.  07/03/16  Yes Duffy Bruce, MD  Hypromellose (ISOPTO TEARS) 0.5 % SOLN Place 1 drop into the left eye 4 (four) times daily.   Yes Historical Provider, MD  levothyroxine (SYNTHROID, LEVOTHROID) 50 MCG tablet Take 50 mcg by mouth daily before breakfast.    Yes Historical Provider, MD  loperamide (IMODIUM A-D) 2 MG tablet Take 2 mg by mouth 4 (four) times daily as needed for diarrhea or loose stools.   Yes Historical Provider, MD  magnesium hydroxide (MILK OF MAGNESIA) 400 MG/5ML suspension Take 30 mLs by mouth daily as needed for mild constipation or moderate constipation.    Yes Historical Provider, MD  magnesium oxide (MAG-OX) 400 (241.3 Mg) MG tablet Take  500 mg by mouth daily.   Yes Historical Provider, MD  meclizine (ANTIVERT) 12.5 MG tablet Take 12.5 mg by mouth 3 (three) times daily as needed for dizziness.    Yes Historical Provider, MD  morphine (MS CONTIN) 15 MG 12 hr tablet Take 1 tablet (15 mg total) by mouth daily. 09/01/16  Yes Costin Karlyne Greenspan, MD  oxyCODONE (OXY IR/ROXICODONE) 5 MG immediate release tablet Take 1 tablet (5 mg total) by mouth every 4 (four) hours as needed for severe pain. 09/01/16  Yes Costin Karlyne Greenspan, MD  pantoprazole (PROTONIX) 40 MG tablet Take 40 mg by  mouth daily before breakfast.    Yes Historical Provider, MD  polyethylene glycol (MIRALAX / GLYCOLAX) packet Take 17 g by mouth daily as needed for mild constipation or moderate constipation.   Yes Historical Provider, MD  predniSONE (DELTASONE) 5 MG tablet Take 5 mg by mouth daily with breakfast.   Yes Historical Provider, MD  promethazine (PHENERGAN) 25 MG tablet Take 25 mg by mouth every 4 (four) hours as needed for nausea or vomiting.   Yes Historical Provider, MD  senna-docusate (SENOKOT-S) 8.6-50 MG per tablet Take 2 tablets by mouth at bedtime. Pt is able to take an additional tablet during the day if needed.   Yes Historical Provider, MD  sertraline (ZOLOFT) 50 MG tablet Take 75 mg by mouth daily.   Yes Historical Provider, MD  traMADol (ULTRAM) 50 MG tablet Take 1 tablet (50 mg total) by mouth every 6 (six) hours as needed for moderate pain. 09/01/16  Yes Caren Griffins, MD    Physical Exam:    Vitals:   09/13/16 0815 09/13/16 0830 09/13/16 0845 09/13/16 0948  BP: (!) 116/54 121/64 128/70   Pulse: 108 105 104   Resp: 25 26 (!) 27   Temp:    101.3 F (38.5 C)  TempSrc:    Rectal  SpO2: 100% 99% 99%        Constitutional: NAD, ill appearing, not communicative at this time, responds to painful stimuli  Vitals:   09/13/16 0815 09/13/16 0830 09/13/16 0845 09/13/16 0948  BP: (!) 116/54 121/64 128/70   Pulse: 108 105 104   Resp: 25 26 (!) 27     Temp:    101.3 F (38.5 C)  TempSrc:    Rectal  SpO2: 100% 99% 99%    Eyes: PERRL, sunken eyes  and conjunctivae normal ENMT: Mucous membranes are dry. Posterior pharynx clear of any exudate or lesions.edentulous  Neck: normal, supple, no masses, no thyromegaly Respiratory:  Essentially clear to auscultation bilaterally anterior, no wheezing,minimal crackles at the bases. Normal respiratory effort. No accessory muscle use.  Cardiovascular:Irregularly irregular  rate and rhythm, 1/6  Murmurs  No extremity edema. 2+ pedal pulses. No carotid bruits.  Abdomen: no apparent  Tenderness to palpation , no masses palpated. No hepatosplenomegaly. Bowel sounds positive.  Musculoskeletal: no clubbing / cyanosis. No joint deformity upper and lower extremities. Muscle wasting noted Skin   Dry with Multiple areas of bruising in all extremities, with some areas covered in bandage in both lower extremities. Temporal wasting noted  Neurologic: cannot assess, patient is not interactive at this time      Labs on Admission: I have personally reviewed following labs and imaging studies  CBC:  Recent Labs Lab 09/13/16 0445  WBC 7.2  NEUTROABS 6.4  HGB 13.7  HCT 42.5  MCV 103.2*  PLT 427*    Basic Metabolic Panel:  Recent Labs Lab 09/13/16 0445  NA 134*  K 4.1  CL 95*  CO2 24  GLUCOSE 181*  BUN 22*  CREATININE 0.94  CALCIUM 8.6*    GFR: CrCl cannot be calculated (Unknown ideal weight.).  Liver Function Tests:  Recent Labs Lab 09/13/16 0445  AST 37  ALT 20  ALKPHOS 110  BILITOT 1.2  PROT 6.3*  ALBUMIN 3.6    Recent Labs Lab 09/13/16 0445  LIPASE 12   No results for input(s): AMMONIA in the last 168 hours.  Coagulation Profile: No results for input(s): INR, PROTIME in the last 168 hours.  Cardiac Enzymes: No  results for input(s): CKTOTAL, CKMB, CKMBINDEX, TROPONINI in the last 168 hours.  BNP (last 3 results) No results for input(s): PROBNP in the last 8760  hours.  HbA1C: No results for input(s): HGBA1C in the last 72 hours.  CBG: No results for input(s): GLUCAP in the last 168 hours.  Lipid Profile: No results for input(s): CHOL, HDL, LDLCALC, TRIG, CHOLHDL, LDLDIRECT in the last 72 hours.  Thyroid Function Tests: No results for input(s): TSH, T4TOTAL, FREET4, T3FREE, THYROIDAB in the last 72 hours.  Anemia Panel: No results for input(s): VITAMINB12, FOLATE, FERRITIN, TIBC, IRON, RETICCTPCT in the last 72 hours.  Urine analysis:    Component Value Date/Time   COLORURINE YELLOW 09/13/2016 0626   APPEARANCEUR CLEAR 09/13/2016 0626   LABSPEC 1.019 09/13/2016 0626   PHURINE 6.0 09/13/2016 0626   GLUCOSEU NEGATIVE 09/13/2016 0626   HGBUR NEGATIVE 09/13/2016 0626   HGBUR trace-intact 05/20/2009 1013   BILIRUBINUR NEGATIVE 09/13/2016 0626   BILIRUBINUR neg 04/08/2011 1455   KETONESUR 5 (A) 09/13/2016 0626   PROTEINUR 30 (A) 09/13/2016 0626   UROBILINOGEN 0.2 11/20/2012 1218   NITRITE NEGATIVE 09/13/2016 0626   LEUKOCYTESUR NEGATIVE 09/13/2016 0626    Sepsis Labs: @LABRCNTIP (procalcitonin:4,lacticidven:4) )No results found for this or any previous visit (from the past 240 hour(s)).   Radiological Exams on Admission: Ct Abdomen Pelvis W Contrast  Result Date: 09/13/2016 CLINICAL DATA:  Acute generalized abdominal pain, vomiting. EXAM: CT ABDOMEN AND PELVIS WITH CONTRAST TECHNIQUE: Multidetector CT imaging of the abdomen and pelvis was performed using the standard protocol following bolus administration of intravenous contrast. CONTRAST:  35mL ISOVUE-300 IOPAMIDOL (ISOVUE-300) INJECTION 61% COMPARISON:  CT scan of April 05, 2014. FINDINGS: Lower chest: Bilateral posterior basilar subsegmental atelectasis or infiltrates is noted. Hepatobiliary: No focal liver abnormality is seen. No gallstones, gallbladder wall thickening, or biliary dilatation. Pancreas: There is noted low density involving the pancreatic head concerning for possible  pancreatitis or pancreatic necrosis. Spleen: Normal in size without focal abnormality. Adrenals/Urinary Tract: Adrenal glands are unremarkable. Kidneys are normal, without renal calculi, focal lesion, or hydronephrosis. Bladder is unremarkable. Stomach/Bowel: Stomach is within normal limits. Appendix appears normal. No evidence of bowel wall thickening, distention, or inflammatory changes. Vascular/Lymphatic: Aortic atherosclerosis. No enlarged abdominal or pelvic lymph nodes. Reproductive: No adnexal abnormality is noted. Uterus is not clearly identified. Other: No abdominal wall hernia or abnormality. No abdominopelvic ascites. Musculoskeletal: Severe multilevel degenerative disc disease is noted in the lower lumbar spine. Status post kyphoplasty of T12 vertebral body. IMPRESSION: Bilateral posterior basilar subsegmental atelectasis or pneumonia. Aortic atherosclerosis. Low density is seen involving the pancreatic head, which may represent pancreatitis or possibly pancreatic necrosis. Correlation with clinical laboratory findings is recommended. Electronically Signed   By: Marijo Conception, M.D.   On: 09/13/2016 07:45   Dg Chest Port 1 View  Result Date: 09/13/2016 CLINICAL DATA:  Sepsis.  Shortness of breath. EXAM: PORTABLE CHEST 1 VIEW COMPARISON:  08/30/2016 FINDINGS: Borderline heart size with normal pulmonary vascularity. Emphysematous changes in the lungs. Scattered fibrosis. Developing infiltration in the right inferior perihilar region may indicate pneumonia in the appropriate clinical setting. No blunting of costophrenic angles. No pneumothorax. Calcification and torsion of the aorta. Previous kyphoplasty changes at T12. IMPRESSION: Emphysematous changes in the lungs. Developing right infrahilar infiltration suggesting pneumonia. Electronically Signed   By: Lucienne Capers M.D.   On: 09/13/2016 05:25    EKG: Independently reviewed.  Assessment/Plan Active Problems:   Mitral valve prolapse    Nausea and vomiting  HCAP (healthcare-associated pneumonia)   Hypothyroidism   Rheumatoid arthritis ([place])   Abdominal pain   Hiatal hernia   COPD (chronic obstructive pulmonary disease) ([facility])   Atrial fibrillation ([facility])   Hypotension   GERD (gastroesophageal reflux disease)   Long term current use of systemic steroids   Chronic obstructive pulmonary disease ([facility])   Closed wedge compression fracture of T8 vertebra ([facility])    Sepsis likely due to respiratory source (HCAP) organism unknown, in a patient with a history of COPD and recent hospitalization  Patient meets criteria given  Fever up to 101, evidence of organ dysfunction with elevated lactic acid, initial hypotension)   Antibiotics delivered in the ED with Vanc and Zosyn . UA  Neg  Osats normal on RA   Admit to SDU Sepsis order set  IV antibiotics by pharmacy with Cefepime and Vanco DuoNebs prn  Follow lactic acid q 3 hrs Follow resp, sputum  and blood cultures  IV fluids at 125 cc/h.  Procalcitonin order set  SLP r/o aspiration as cause of PNA    Intractable nausea and vomiting with diarrhea  CT abdomen and pelvislow-density seen at the pancreatic head, which could represent pancreatitis versus pancreatic necrosis, but normal LFTs and lipase. No bleeding issues noted. No apparent abdominal pain on exam. WBC normal . Fever up to 101.  Received 2 l fluids at the ED . Suspect diarrhea may be related to recent antibiotics and laxatives given at SNF.  Clear liquids  IV  Zofran, analgesics   IV fluids as above  Hold laxatives for now, will continue to monitor  Advance diet as tolerated   Consider GI consult if not improving  History of closed wedge compression fracture of T8 vertebra during recent admission 12/25-12/28, currently on SNF, not a candidate for kyphoplasty as per recent Neurosurgery notes Treat symptomatically with pain meds, neurontin Patient to follow up with Dr. Sherwood Gambler in 1 month as  OP  Hypothyroidism: -Continue home Synthroid  Atrial Fibrillation    Rate controlled -Continue meds   Rheumathoid Arthritis Continue Plaquenil and Steroids   Hyoerglycemia, mild in the setting of chronic steroids  Current blood sugar level is 181 No results found for: HGBA1C Hgb A1C SSI    DVT prophylaxis: Lovenox  Code Status:  DNR Family Communication:  none Disposition Plan: Expect patient to be discharged to nursing  home after condition improves Consults called:    None Admission status:  SDU    Macy Lingenfelter E, PA-C [facility]   09/13/2016, 10:08 AM

## 2016-09-13 NOTE — ED Notes (Signed)
RN to hold PO meds at this time due to patients lethargy; concerns for aspiration.

## 2016-09-13 NOTE — ED Notes (Signed)
Report given to RN, Estill Batten. Pt to go to [facility] per charge.

## 2016-09-13 NOTE — ED Notes (Signed)
MRI called this RN to notify of pt's need for new IV.  This RN went to MRI to restart IV.

## 2016-09-13 NOTE — ED Notes (Signed)
Myself and Dorcas Mcmurray, NT washed patient from having a very large bowel movement of diahrrea; washed and dried patient's buttocks area, groin area and upper thigh areas; placed 3 chuks underneath patient and placed a diaper on patient; also noted before we cleaned patient she has an  approximately 2 inch skin tear to her lower inner right thigh, after cleaning was performed a tegaderm was placed on skin tear; performed rectal temperature on patient; repositioned patient on stretcher and Abigail Butts, RN was notified of event

## 2016-09-13 NOTE — ED Notes (Signed)
Myself and Benjamine Mola, RN readjusted patient on stretcher; warm blankets given; patient resting at this time

## 2016-09-13 NOTE — ED Provider Notes (Signed)
TIME SEEN: 4:20 AM  CHIEF COMPLAINT: Chills, nausea, vomiting, diarrhea  HPI: Pt is a 83 y.o. female with history of atrial fibrillation, COPD who presents from her skilled nursing home with nausea, vomiting and diarrhea that started tonight and chills. Complains of diffuse abdominal pain. Patient denies any chest pain, headache. Denies shortness of breath. Has history of rheumatoid arthritis and is on Plaquenil and prednisone.  ROS: See HPI Constitutional: no fever; positive chills Eyes: no drainage  ENT: no runny nose   Cardiovascular:  no chest pain  Resp: no SOB  GI: Diarrhea and vomiting GU: no dysuria Integumentary: no rash  Allergy: no hives  Musculoskeletal: no leg swelling  Neurological: no slurred speech ROS otherwise negative  PAST MEDICAL HISTORY/PAST SURGICAL HISTORY:  Past Medical History:  Diagnosis Date  . Atrial fibrillation ([place])   . Carcinoma ([place])    "top of left leg, had it taken off; right cheek, there now" (11/20/2012)  . Chronic lower back pain    "from a ruptured lower disc" (11/20/2012)  . COPD (chronic obstructive pulmonary disease) ([place])    "not bothered w/it anymore" (11/20/2012)  . Diverticulitis   . Diverticulosis   . GERD (gastroesophageal reflux disease)   . Hiatal hernia   . Hypothyroidism   . Mitral valve prolapse   . Orthostatic hypotension   . Osteoarthritis   . Osteoporosis   . Rheumatoid arthritis(714.0)   . Shingles   . Vertigo     MEDICATIONS:  Prior to Admission medications   Medication Sig Start Date End Date Taking? Authorizing Provider  acetaminophen (TYLENOL) 325 MG tablet Take 650 mg by mouth every 4 (four) hours as needed for mild pain, moderate pain, fever or headache.     Historical Provider, MD  aspirin 81 MG chewable tablet Chew 81 mg by mouth daily.    Historical Provider, MD  bisacodyl (DULCOLAX) 10 MG suppository Place 10 mg rectally as needed for moderate constipation.    Historical Provider, MD  calcium carbonate  (TUMS - DOSED IN MG ELEMENTAL CALCIUM) 500 MG chewable tablet Chew 1 tablet by mouth daily.    Historical Provider, MD  cholecalciferol (VITAMIN D) 1000 units tablet Take 2,000 Units by mouth daily.    Historical Provider, MD  cyanocobalamin 100 MCG tablet Take 100 mcg by mouth daily.    Historical Provider, MD  diclofenac sodium (VOLTAREN) 1 % GEL Apply 2 g topically every 6 (six) hours as needed (for pain).     Historical Provider, MD  folic acid (FOLVITE) 1 MG tablet Take 1 mg by mouth daily.    Historical Provider, MD  gabapentin (NEURONTIN) 100 MG capsule Take 1 capsule (100 mg total) by mouth 3 (three) times daily. Patient taking differently: Take 100 mg by mouth 2 (two) times daily.  07/03/16 08/31/16  Duffy Bruce, MD  hydroxychloroquine (PLAQUENIL) 200 MG tablet Take 200 mg by mouth at bedtime.     Historical Provider, MD  hydroxypropyl methylcellulose / hypromellose (ISOPTO TEARS / GONIOVISC) 2.5 % ophthalmic solution Place 1 drop into the left eye 4 (four) times daily. 07/03/16   Duffy Bruce, MD  levothyroxine (SYNTHROID, LEVOTHROID) 50 MCG tablet Take 50 mcg by mouth daily before breakfast.     Historical Provider, MD  loperamide (IMODIUM) 2 MG capsule Take 2 mg by mouth as needed for diarrhea or loose stools.    Historical Provider, MD  magnesium gluconate (MAGONATE) 500 MG tablet Take 500 mg by mouth 2 (two) times daily.  Historical Provider, MD  magnesium hydroxide (MILK OF MAGNESIA) 400 MG/5ML suspension Take 30 mLs by mouth daily as needed for mild constipation or moderate constipation.     Historical Provider, MD  meclizine (ANTIVERT) 12.5 MG tablet Take 12.5 mg by mouth 3 (three) times daily as needed for dizziness.     Historical Provider, MD  morphine (MS CONTIN) 15 MG 12 hr tablet Take 1 tablet (15 mg total) by mouth daily. 09/01/16   Costin Karlyne Greenspan, MD  oxyCODONE (OXY IR/ROXICODONE) 5 MG immediate release tablet Take 1 tablet (5 mg total) by mouth every 4 (four) hours  as needed for severe pain. 09/01/16   Costin Karlyne Greenspan, MD  pantoprazole (PROTONIX) 40 MG tablet Take 40 mg by mouth daily before breakfast.     Historical Provider, MD  polyethylene glycol (MIRALAX / GLYCOLAX) packet Take 17 g by mouth daily as needed for mild constipation or moderate constipation.    Historical Provider, MD  predniSONE (DELTASONE) 5 MG tablet Take 5 mg by mouth daily with breakfast.    Historical Provider, MD  promethazine (PHENERGAN) 25 MG tablet Take 25 mg by mouth every 4 (four) hours as needed for nausea or vomiting.    Historical Provider, MD  senna-docusate (SENOKOT-S) 8.6-50 MG per tablet Take 2 tablets by mouth at bedtime. Pt is able to take an additional tablet during the day if needed.    Historical Provider, MD  sertraline (ZOLOFT) 50 MG tablet Take 75 mg by mouth daily.    Historical Provider, MD  traMADol (ULTRAM) 50 MG tablet Take 1 tablet (50 mg total) by mouth every 6 (six) hours as needed for moderate pain. 09/01/16   Costin Karlyne Greenspan, MD    ALLERGIES:  Allergies  Allergen Reactions  . Lactose Intolerance (Gi) Other (See Comments)    Reaction:  GI upset     SOCIAL HISTORY:  Social History  Substance Use Topics  . Smoking status: Former Smoker    Packs/day: 0.12    Years: 40.00    Types: Cigarettes    Quit date: 09/06/1985  . Smokeless tobacco: Never Used     Comment: 11/20/2012 "stopped smoking 27 yr ago"  . Alcohol use No    FAMILY HISTORY: Family History  Problem Relation Age of Onset  . Cancer Mother     bladder  . Hyperlipidemia Sister   . Stroke Maternal Aunt     EXAM: BP 128/84 (BP Location: Right Arm)   Pulse 119   Temp 101.8 F (38.8 C) (Rectal)   Resp 22   SpO2 96%  CONSTITUTIONAL: Alert and oriented and responds appropriately to questions. Chronically ill-appearing, elderly, dried vomit on her face HEAD: Normocephalic EYES: Conjunctivae clear, PERRL, EOMI ENT: normal nose; no rhinorrhea; dry mucous membranes NECK: Supple, no  meningismus, no nuchal rigidity, no LAD  CARD: Regular and tachycardic; S1 and S2 appreciated; no murmurs, no clicks, no rubs, no gallops RESP: Normal chest excursion without splinting or tachypnea; breath sounds clear and equal bilaterally; no wheezes, no rhonchi, no rales, no hypoxia or respiratory distress, speaking full sentences ABD/GI: Normal bowel sounds; non-distended; soft, mildly tender to palpation diffusely, no rebound, no guarding, no peritoneal signs, no hepatosplenomegaly BACK:  The back appears normal and is non-tender to palpation, there is no CVA tenderness EXT: Normal ROM in all joints; non-tender to palpation; no edema; normal capillary refill; no cyanosis, no calf tenderness or swelling    SKIN: Normal color for age and race; warm; no  rash NEURO: Moves all extremities equally PSYCH: The patient's mood and manner are appropriate. Grooming and personal hygiene are appropriate.  MEDICAL DECISION MAKING: Patient here with nausea, vomiting or diarrhea. Feels very warm to the times and is tachycardic with EMS but not hypotensive. Concern for sepsis. Will obtain rectal temp, cultures, urine, chest x-ray a CT of her abdomen and pelvis. She is immunocompromised on prednisone and Plaquenil. I feel she will need admission.  ED PROGRESS: 5:40 AM  Pt's heart rate is improving. She reports feeling that her nausea is better but complaining of abdominal pain. We'll give fentanyl. She does state she lives in independent living. She is not in a nursing home. She is a DO NOT RESUSCITATE. X-ray shows possible pneumonia. Lactate is 3.1. She is receiving IV fluids and broad-spectrum antibiotics.  It appears patient recently admitted to the hospital for T8 compression fracture, discharged on 09/02/16.  9:00 AM  Pt's CTAP shows bilateral pneumonia. There is a low density seen at the pancreatic head which could represent pancreatitis versus pancreatic necrosis. LFTs and lipase are normal. She does not  have any new oxygen requirement. Will admit for healthcare associated pneumonia. Discussed with Elmyra Ricks, nursing staff at [facility]. She confirms patient is in the skilled nursing facility. They state patient began vomiting uncontrollably last night and began coughing. Was given Phenergan prior to arrival without relief. Unable to get in touch with Gita Kudo, pt's daughter.   9:30 AM  Discussed patient's case with hospitalist service. They will see patient in place admission orders. Patient has been updated with plan. Care transferred to hospitalist service.  I reviewed all nursing notes, vitals, pertinent old records, EKGs, labs, imaging (as available).    EKG Interpretation  Date/Time:  Monday September 13 2016 05:01:28 EST Ventricular Rate:  117 PR Interval:    QRS Duration: 120 QT Interval:  342 QTC Calculation: 478 R Axis:   -67 Text Interpretation:  Sinus tachycardia Right bundle branch block Inferior infarct, old Artifact in lead(s) I III aVR aVL aVF V1 V2 V4 V5 V6 No significant change since last tracing Confirmed by Dinorah Masullo,  DO, Jacy Howat [id number]) on 09/13/2016 5:03:33 AM        CRITICAL CARE Performed by: Nyra Jabs   Total critical care time: 60 minutes  Critical care time was exclusive of separately billable procedures and treating other patients.  Critical care was necessary to treat or prevent imminent or life-threatening deterioration.  Critical care was time spent personally by me on the following activities: development of treatment plan with patient and/or surrogate as well as nursing, discussions with consultants, evaluation of patient's response to treatment, examination of patient, obtaining history from patient or surrogate, ordering and performing treatments and interventions, ordering and review of laboratory studies, ordering and review of radiographic studies, pulse oximetry and re-evaluation of patient's condition.     [place], DO 09/13/16  [phone]

## 2016-09-13 NOTE — Progress Notes (Addendum)
Pharmacy Antibiotic Note  [name] is a 83 y.o. female admitted on 09/13/2016 with pneumonia.  Pharmacy has been consulted for vancomycin dosing.  Patient received vancomycin 1g and zosyn 3.375g IV once in the ED.  Plan: Vancomycin 500mg  IV every 12 hours.  Goal trough 15-20 mcg/mL.  Cefepime 2g IV q24h Monitor culture data, renal function and clinical course VT at SS prn     Temp (24hrs), Avg:101.6 F (38.7 C), Min:101.3 F (38.5 C), Max:101.8 F (38.8 C)   Recent Labs Lab 09/13/16 0445 09/13/16 0508 09/13/16 0833  WBC 7.2  --   --   CREATININE 0.94  --   --   LATICACIDVEN  --  3.14* 2.84*    CrCl cannot be calculated (Unknown ideal weight.).    Allergies  Allergen Reactions  . Lactose Intolerance (Gi) Other (See Comments)    Reaction:  GI upset      Andrey Cota. Diona Foley, PharmD, BCPS Clinical Pharmacist Pager [phone] 09/13/2016 10:35 AM

## 2016-09-13 NOTE — ED Notes (Signed)
Potassium level was reported to MD

## 2016-09-13 NOTE — ED Notes (Signed)
Checked patient's diaper; dry at this time.

## 2016-09-13 NOTE — ED Notes (Signed)
Patient transported to MRI 

## 2016-09-14 ENCOUNTER — Inpatient Hospital Stay (HOSPITAL_COMMUNITY): Payer: Medicare Other

## 2016-09-14 DIAGNOSIS — R6520 Severe sepsis without septic shock: Secondary | ICD-10-CM

## 2016-09-14 DIAGNOSIS — R54 Age-related physical debility: Secondary | ICD-10-CM | POA: Diagnosis present

## 2016-09-14 DIAGNOSIS — Z515 Encounter for palliative care: Secondary | ICD-10-CM

## 2016-09-14 DIAGNOSIS — Z7189 Other specified counseling: Secondary | ICD-10-CM

## 2016-09-14 LAB — COMPREHENSIVE METABOLIC PANEL
ALK PHOS: 90 U/L (ref 38–126)
ALT: 20 U/L (ref 14–54)
AST: 27 U/L (ref 15–41)
Albumin: 2.6 g/dL — ABNORMAL LOW (ref 3.5–5.0)
Anion gap: 8 (ref 5–15)
BILIRUBIN TOTAL: 0.2 mg/dL — AB (ref 0.3–1.2)
BUN: 10 mg/dL (ref 6–20)
CALCIUM: 8.1 mg/dL — AB (ref 8.9–10.3)
CO2: 24 mmol/L (ref 22–32)
CREATININE: 0.68 mg/dL (ref 0.44–1.00)
Chloride: 106 mmol/L (ref 101–111)
GFR calc Af Amer: >60 mL/min (ref >60)
Glucose, Bld: 72 mg/dL (ref 65–99)
Potassium: 2.5 mmol/L — CL (ref 3.5–5.1)
Sodium: 138 mmol/L (ref 135–145)
TOTAL PROTEIN: 5.2 g/dL — AB (ref 6.5–8.1)

## 2016-09-14 LAB — CBC
HCT: 34.9 % — ABNORMAL LOW (ref 36.0–46.0)
Hemoglobin: 11.2 g/dL — ABNORMAL LOW (ref 12.0–15.0)
MCH: 33.3 pg (ref 26.0–34.0)
MCHC: 32.1 g/dL (ref 30.0–36.0)
MCV: 103.9 fL — ABNORMAL HIGH (ref 78.0–100.0)
PLATELETS: 320 10*3/uL (ref 150–400)
RBC: 3.36 MIL/uL — AB (ref 3.87–5.11)
RDW: 14.9 % (ref 11.5–15.5)
WBC: 10.9 10*3/uL — AB (ref 4.0–10.5)

## 2016-09-14 LAB — BASIC METABOLIC PANEL
ANION GAP: 7 (ref 5–15)
BUN: 5 mg/dL — AB (ref 6–20)
CALCIUM: 8.4 mg/dL — AB (ref 8.9–10.3)
CO2: 26 mmol/L (ref 22–32)
Chloride: 103 mmol/L (ref 101–111)
Creatinine, Ser: 0.61 mg/dL (ref 0.44–1.00)
GFR calc Af Amer: >60 mL/min (ref >60)
GLUCOSE: 123 mg/dL — AB (ref 65–99)
POTASSIUM: 3.1 mmol/L — AB (ref 3.5–5.1)
Sodium: 136 mmol/L (ref 135–145)

## 2016-09-14 LAB — GLUCOSE, CAPILLARY
GLUCOSE-CAPILLARY: 95 mg/dL (ref 65–99)
GLUCOSE-CAPILLARY: 106 mg/dL — AB (ref 65–99)
GLUCOSE-CAPILLARY: 118 mg/dL — AB (ref 65–99)
Glucose-Capillary: 64 mg/dL — ABNORMAL LOW (ref 65–99)
Glucose-Capillary: 102 mg/dL — ABNORMAL HIGH (ref 65–99)
Glucose-Capillary: 130 mg/dL — ABNORMAL HIGH (ref 65–99)

## 2016-09-14 LAB — INFLUENZA PANEL BY PCR (TYPE A & B)
INFLAPCR: NEGATIVE
INFLBPCR: NEGATIVE

## 2016-09-14 LAB — LEGIONELLA PNEUMOPHILA SEROGP 1 UR AG: L. pneumophila Serogp 1 Ur Ag: NEGATIVE

## 2016-09-14 LAB — URINE CULTURE: CULTURE: NO GROWTH

## 2016-09-14 LAB — MRSA PCR SCREENING: MRSA BY PCR: NEGATIVE

## 2016-09-14 MED ORDER — DEXTROSE 50 % IV SOLN
INTRAVENOUS | Status: AC
  Administered 2016-09-14: 25 mL
  Filled 2016-09-14: qty 50

## 2016-09-14 MED ORDER — POTASSIUM CHLORIDE CRYS ER 20 MEQ PO TBCR
40.0000 meq | EXTENDED_RELEASE_TABLET | Freq: Once | ORAL | Status: AC
  Administered 2016-09-14: 40 meq via ORAL
  Filled 2016-09-14: qty 2

## 2016-09-14 MED ORDER — SODIUM CHLORIDE 0.9 % IV SOLN
30.0000 meq | Freq: Once | INTRAVENOUS | Status: DC
  Filled 2016-09-14: qty 15

## 2016-09-14 MED ORDER — ENOXAPARIN SODIUM 40 MG/0.4ML ~~LOC~~ SOLN
40.0000 mg | SUBCUTANEOUS | Status: DC
  Administered 2016-09-14 – 2016-09-16 (×3): 40 mg via SUBCUTANEOUS
  Filled 2016-09-14 (×3): qty 0.4

## 2016-09-14 MED ORDER — DEXTROSE 5 % IV SOLN
INTRAVENOUS | Status: DC
  Administered 2016-09-14: 50 mL via INTRAVENOUS
  Administered 2016-09-15 – 2016-09-16 (×3): via INTRAVENOUS

## 2016-09-14 MED ORDER — SERTRALINE HCL 50 MG PO TABS: 75.0000 mg | ORAL_TABLET | Freq: Every day | ORAL | Status: DC

## 2016-09-14 MED ORDER — SODIUM CHLORIDE 0.9 % IV SOLN
1.5000 g | Freq: Four times a day (QID) | INTRAVENOUS | Status: DC
  Administered 2016-09-14 – 2016-09-16 (×9): 1.5 g via INTRAVENOUS
  Filled 2016-09-14 (×13): qty 1.5

## 2016-09-14 MED ORDER — POTASSIUM CHLORIDE 2 MEQ/ML IV SOLN
INTRAVENOUS | Status: DC
  Administered 2016-09-14 – 2016-09-15 (×2): via INTRAVENOUS
  Filled 2016-09-14 (×4): qty 1000

## 2016-09-14 MED ORDER — MORPHINE SULFATE ER 15 MG PO TBCR
15.0000 mg | EXTENDED_RELEASE_TABLET | Freq: Two times a day (BID) | ORAL | Status: DC
  Administered 2016-09-14 – 2016-09-16 (×5): 15 mg via ORAL
  Filled 2016-09-14 (×5): qty 1

## 2016-09-14 MED ORDER — SERTRALINE HCL 50 MG PO TABS
75.0000 mg | ORAL_TABLET | Freq: Every day | ORAL | Status: DC
  Administered 2016-09-14 – 2016-09-16 (×3): 75 mg via ORAL
  Filled 2016-09-14 (×3): qty 2

## 2016-09-14 MED ORDER — DEXTROSE 10 % IV SOLN
INTRAVENOUS | Status: DC
  Administered 2016-09-14: 03:00:00 via INTRAVENOUS

## 2016-09-14 NOTE — Progress Notes (Signed)
CRITICAL VALUE ALERT  Critical value received:  Pot 2.5  Date of notification:  09/14/2016   Time of notification:  0630  Critical value read back:Yes.    Nurse who received alert:  Monico Hoar   MD notified (1st page):  patel   Time of first page:  0650  MD notified (2nd page):  Time of second page:  Responding MD:  patel   Time MD responded:  [phone]

## 2016-09-14 NOTE — Consult Note (Signed)
Consultation Note Date: 09/14/2016   Patient Name: [name]  DOB: 1923/12/08  MRN: [id number]  Age / Sex: 83 y.o., female  PCP: Lauren Rosenthal, MD Referring Physician: Lavina Hamman, MD    Reason for Consultation: Establishing goals of care after sepsis   HPI/Patient Profile: 83 y.o. female  with past medical history of A fib, COPD, recent admissions for T8 compression fracture, hypotension, mitral valve prolapse, diverticulitis was admitted on 09/13/2016 with acute onset nausea, vomiting,diarrhea, and chills.  Clinical Assessment and Goals of Care: The patient was in good spirits when we entered the room, although appeared in discomfort.  When asked how she was doing, she adamantly wished for removal of blood pressure cuff - was much more comfortable after removal.  She denied current nausea or pain, as well as any other concerns.  We discussed Ms. [name]' functionality at her facility, during which she said she walks with a walker but without assistance and eats solid foods "well" also without assistance.  She did mention that she has fallen 3 times in the recent past, and hurt her legs and back in doing so.  We then discussed her past, talked about her growing up in [place], then moving to [place] for [employer], then eventually ending up in [place].  She currently resides at [facility].  Ms. [name] has two children; her daughter is local and works in this hospital, and her son lives in [place], "VERY far away."  When asked about any religious affiliation, she said she used to go to church every week, but hasn't in a long time.  She did convey interest in the chaplain visiting.   After the patient consult, palliative PA [name] called POA daughter [name] (called "[name]") and discussed our visit with her mother.  [name] explained that her mother has fallen 4 times, that she's eating about 50% of  eash meal, and that she is actually wheelchair-bound currently. We planned to have a more detailed discussion regarding care tomorrow at 1pm.  HCPOA daughter [name] employee [phone]  SUMMARY OF RECOMMENDATIONS   Will meet tomorrow at 1pm for more detailed discussion regarding patient care and to complete MOST form.  Code Status/Advance Care Planning:  DNR     Symptom Management:   Chronic pain - restart MS contin  Depression - restart Zoloft  Back pain - have patient fitted with back brace or abdominal binder   Additional Recommendations (Limitations, Scope, Preferences):  Full Scope Treatment  Psycho-social/Spiritual:   Desire for further Chaplaincy support: YES  Additional Recommendations: Caregiving  Support/Resources  Prognosis:   < 12 months - age of 37, frail, multiple recent falls, less ambulatory, multiple admissions  Discharge Planning: [name] for rehab with Palliative care service follow-up      Primary Diagnoses: Present on Admission: . Hypothyroidism . Rheumatoid arthritis ([place]) . Abdominal pain . Mitral valve prolapse . COPD (chronic obstructive pulmonary disease) ([place]) . Atrial fibrillation ([place]) . Hypotension . Nausea and vomiting . GERD (gastroesophageal reflux  disease) . Chronic obstructive pulmonary disease ([place]) . Closed wedge compression fracture of T8 vertebra ([place]) . HCAP (healthcare-associated pneumonia) . Sepsis ([place])   I have reviewed the medical record, interviewed the patient and family, and examined the patient. The following aspects are pertinent.  Past Medical History:  Diagnosis Date  . Atrial fibrillation ([place])   . Carcinoma ([place])    "top of left leg, had it taken off; right cheek, there now" (11/20/2012)  . Chronic lower back pain    "from a ruptured lower disc" (11/20/2012)  . COPD (chronic obstructive pulmonary disease) ([place])    "not bothered w/it anymore" (11/20/2012)  .  Diverticulitis   . Diverticulosis   . GERD (gastroesophageal reflux disease)   . Hiatal hernia   . Hypothyroidism   . Mitral valve prolapse   . Orthostatic hypotension   . Osteoarthritis   . Osteoporosis   . Rheumatoid arthritis(714.0)   . Shingles   . Vertigo    Social History   Social History  . Marital status: Widowed    Spouse name: N/A  . Number of children: N/A  . Years of education: N/A   Social History Main Topics  . Smoking status: Former Smoker    Packs/day: 0.12    Years: 40.00    Types: Cigarettes    Quit date: 09/06/1985  . Smokeless tobacco: Never Used     Comment: 11/20/2012 "stopped smoking 27 yr ago"  . Alcohol use No  . Drug use: No  . Sexual activity: No   Other Topics Concern  . None   Social History Narrative  . None   Family History  Problem Relation Age of Onset  . Cancer Mother     bladder  . Hyperlipidemia Sister   . Stroke Maternal Aunt    Scheduled Meds: . ampicillin-sulbactam (UNASYN) IV  1.5 g Intravenous Q6H  . artificial tears  1 application Left Eye Daily  . aspirin  81 mg Oral Daily  . enoxaparin (LOVENOX) injection  40 mg Subcutaneous Q24H  . folic acid  1 mg Oral Daily  . hydroxychloroquine  200 mg Oral QHS  . levothyroxine  50 mcg Oral QAC breakfast  . pantoprazole  40 mg Oral QAC breakfast  . potassium chloride  40 mEq Oral Once  . predniSONE  5 mg Oral Q breakfast  . cyanocobalamin  100 mcg Oral Daily   Continuous Infusions: . dextrose 5 % with kcl 100 mL/hr at 09/14/16 1038  . dextrose     PRN Meds:.acetaminophen **OR** acetaminophen, bisacodyl, ipratropium-albuterol, meclizine, ondansetron **OR** ondansetron (ZOFRAN) IV, oxyCODONE, senna-docusate, traZODone Medications Prior to Admission:  Prior to Admission medications   Medication Sig Start Date End Date Taking? Authorizing Provider  acetaminophen (TYLENOL) 325 MG tablet Take 650 mg by mouth every 4 (four) hours as needed for mild pain, moderate pain, fever or  headache.    Yes Historical Provider, MD  artificial tears (LACRILUBE) OINT ophthalmic ointment Place 1 application into the left eye daily.   Yes Historical Provider, MD  aspirin 81 MG chewable tablet Chew 81 mg by mouth daily.   Yes Historical Provider, MD  bisacodyl (DULCOLAX) 10 MG suppository Place 10 mg rectally every three (3) days as needed for moderate constipation.    Yes Historical Provider, MD  calcium carbonate (TUMS - DOSED IN MG ELEMENTAL CALCIUM) 500 MG chewable tablet Chew 1 tablet by mouth daily.   Yes Historical Provider, MD  cholecalciferol (VITAMIN D) 1000 units tablet Take 2,000  Units by mouth daily.   Yes Historical Provider, MD  cyanocobalamin 100 MCG tablet Take 100 mcg by mouth daily.   Yes Historical Provider, MD  diclofenac sodium (VOLTAREN) 1 % GEL Apply 2 g topically every 6 (six) hours as needed (for pain).    Yes Historical Provider, MD  folic acid (FOLVITE) 1 MG tablet Take 1 mg by mouth daily.   Yes Historical Provider, MD  gabapentin (NEURONTIN) 100 MG capsule Take 1 capsule (100 mg total) by mouth 3 (three) times daily. 07/03/16 09/13/16 Yes Duffy Bruce, MD  hydroxychloroquine (PLAQUENIL) 200 MG tablet Take 200 mg by mouth at bedtime.    Yes Historical Provider, MD  hydroxypropyl methylcellulose / hypromellose (ISOPTO TEARS / GONIOVISC) 2.5 % ophthalmic solution Place 1 drop into the left eye 4 (four) times daily. Patient taking differently: Place 1 drop into both eyes every 2 (two) hours while awake.  07/03/16  Yes Duffy Bruce, MD  Hypromellose (ISOPTO TEARS) 0.5 % SOLN Place 1 drop into the left eye 4 (four) times daily.   Yes Historical Provider, MD  levothyroxine (SYNTHROID, LEVOTHROID) 50 MCG tablet Take 50 mcg by mouth daily before breakfast.    Yes Historical Provider, MD  loperamide (IMODIUM A-D) 2 MG tablet Take 2 mg by mouth 4 (four) times daily as needed for diarrhea or loose stools.   Yes Historical Provider, MD  magnesium hydroxide (MILK OF  MAGNESIA) 400 MG/5ML suspension Take 30 mLs by mouth daily as needed for mild constipation or moderate constipation.    Yes Historical Provider, MD  magnesium oxide (MAG-OX) 400 (241.3 Mg) MG tablet Take 500 mg by mouth daily.   Yes Historical Provider, MD  meclizine (ANTIVERT) 12.5 MG tablet Take 12.5 mg by mouth 3 (three) times daily as needed for dizziness.    Yes Historical Provider, MD  morphine (MS CONTIN) 15 MG 12 hr tablet Take 1 tablet (15 mg total) by mouth daily. 09/01/16  Yes Costin Karlyne Greenspan, MD  oxyCODONE (OXY IR/ROXICODONE) 5 MG immediate release tablet Take 1 tablet (5 mg total) by mouth every 4 (four) hours as needed for severe pain. 09/01/16  Yes Costin Karlyne Greenspan, MD  pantoprazole (PROTONIX) 40 MG tablet Take 40 mg by mouth daily before breakfast.    Yes Historical Provider, MD  polyethylene glycol (MIRALAX / GLYCOLAX) packet Take 17 g by mouth daily as needed for mild constipation or moderate constipation.   Yes Historical Provider, MD  predniSONE (DELTASONE) 5 MG tablet Take 5 mg by mouth daily with breakfast.   Yes Historical Provider, MD  promethazine (PHENERGAN) 25 MG tablet Take 25 mg by mouth every 4 (four) hours as needed for nausea or vomiting.   Yes Historical Provider, MD  senna-docusate (SENOKOT-S) 8.6-50 MG per tablet Take 2 tablets by mouth at bedtime. Pt is able to take an additional tablet during the day if needed.   Yes Historical Provider, MD  sertraline (ZOLOFT) 50 MG tablet Take 75 mg by mouth daily.   Yes Historical Provider, MD  traMADol (ULTRAM) 50 MG tablet Take 1 tablet (50 mg total) by mouth every 6 (six) hours as needed for moderate pain. 09/01/16  Yes Costin Karlyne Greenspan, MD   Allergies  Allergen Reactions  . Lactose Intolerance (Gi) Other (See Comments)    Reaction:  GI upset    Review of Systems  Respiratory: Positive for choking.   Gastrointestinal: Positive for abdominal pain.  Musculoskeletal: Positive for back pain. Negative for myalgias.  Skin:  Positive for wound.    Physical Exam  Constitutional: She appears well-developed and well-nourished. No distress.  Cardiovascular: Normal rate, regular rhythm and normal heart sounds.   Pulmonary/Chest: Effort normal and breath sounds normal.  Abdominal: Soft. Bowel sounds are normal.  Musculoskeletal: She exhibits no edema.  Skin: She is not diaphoretic.   Bilateral bandaged wounds distal to knees on legs - bandages intact.  Vital Neal: BP (!) 142/61   Pulse 69   Temp 98.4 F (36.9 C) (Oral)   Resp 18   Ht 5\' 6"  (1.676 m)   Wt 49.9 kg (109 lb 14.4 oz)   SpO2 98%   BMI 17.74 kg/m  Pain Assessment: No/denies pain   Pain Score: Asleep   SpO2: SpO2: 98 % O2 Device:SpO2: 98 % O2 Flow Rate: .O2 Flow Rate (L/min): 3 L/min  IO: Intake/output summary:  Intake/Output Summary (Last 24 hours) at 09/14/16 1400 Last data filed at 09/14/16 0915  Gross per 24 hour  Intake              455 ml  Output                1 ml  Net              454 ml    LBM: Last BM Date: 09/14/16 Baseline Weight: Weight: 49.4 kg (109 lb) Most recent weight: Weight: 49.9 kg (109 lb 14.4 oz)     Palliative Assessment/Data:   Flowsheet Rows   Flowsheet Row Most Recent Value  Intake Tab  Referral Department  Hospitalist  Unit at Time of Referral  Cardiac/Telemetry Unit  Palliative Care Primary Diagnosis  Sepsis/Infectious Disease  Date Notified  09/13/16  Palliative Care Type  New Palliative care  Reason for referral  Clarify Goals of Care  Date of Admission  09/13/16  Date first seen by Palliative Care  09/14/16  # of days Palliative referral response time  1 Day(s)  # of days IP prior to Palliative referral  0  Clinical Assessment  Palliative Performance Scale Score  40%  Pain Max last 24 hours  3  Pain Min Last 24 hours  0  Nausea Max Last 24 Hours  10  Nausea Min Last 24 Hours  0  Psychosocial & Spiritual Assessment  Palliative Care Outcomes  Patient/Family meeting held?  Yes  Who was  at the meeting?  Patient, daughter on phone  Palliative Care Outcomes  Clarified goals of care      Time In: 13:30 Time Out: 14:45 Time Total: 75 Greater than 50%  of this time was spent counseling and coordinating care related to the above assessment and plan.  Signed by: Olene Floss, Student-PA   Please contact Palliative Medicine Team phone at [phone] for questions and concerns.  For individual provider: See Shea Evans

## 2016-09-14 NOTE — Progress Notes (Signed)
[facility] Progress Note Patient Details:  [name] 1923-10-30 [id number]  Patient ID: [name], female   DOB: Dec 05, 1923, 83 y.o.   MRN: [id number]   [place] Pink 09/14/2016, 2:39 PMCalled [employer] for lumbar sacral brace.

## 2016-09-14 NOTE — Progress Notes (Signed)
Hypoglycemic Event  CBG: 64  Treatment: D50 IV 25 mL  Symptoms: pt lethargic    Follow-up CBG: Time:0135 CBG Result:95  Possible Reasons for Event: Inadequate meal intake pts blood sugar was also not checked in ED.   Comments/MD notified:MD notified     Sarajane Jews

## 2016-09-14 NOTE — Progress Notes (Signed)
Received report on patient from [place]. Made aware of over due vanc and no temp on septic pt since am.  0030, have not received pt from ED after getting report.

## 2016-09-14 NOTE — Evaluation (Signed)
Clinical/Bedside Swallow Evaluation Patient Details  Name: [name] MRN: [id number] Date of Birth: 29-Nov-1923  Today's Date: 09/14/2016 Time: SLP Start Time (ACUTE ONLY): 28 SLP Stop Time (ACUTE ONLY): 0932 SLP Time Calculation (min) (ACUTE ONLY): 17 min  Past Medical History:  Past Medical History:  Diagnosis Date  . Atrial fibrillation ([place])   . Carcinoma ([place])    "top of left leg, had it taken off; right cheek, there now" (11/20/2012)  . Chronic lower back pain    "from a ruptured lower disc" (11/20/2012)  . COPD (chronic obstructive pulmonary disease) ([place])    "not bothered w/it anymore" (11/20/2012)  . Diverticulitis   . Diverticulosis   . GERD (gastroesophageal reflux disease)   . Hiatal hernia   . Hypothyroidism   . Mitral valve prolapse   . Orthostatic hypotension   . Osteoarthritis   . Osteoporosis   . Rheumatoid arthritis(714.0)   . Shingles   . Vertigo    Past Surgical History:  Past Surgical History:  Procedure Laterality Date  . BACK SURGERY    . FIXATION KYPHOPLASTY THORACIC SPINE  ~ 2011   "after falling and breaking 2 vertebra" (11/20/2012)  . SKIN CANCER EXCISION Left ~ 2007   "top of my lower leg" (11/20/2012)  . TENDON REPAIR Right ~ 2009   wrist (11/20/2012)  . TONSILLECTOMY  ~ 1933   HPI:  [name] a 83 y.o.femalewho presents with Sepsis likely from HCAP w/ h/o recent hospitalizations for compression fracture of T8. Of note CT showing possible lesion in pancreatic head. No Sx or labs to suggest biliary abnormality or pancreatitis. Hypotension and lactic acid improving after fluid bolus. Pt weighs only 115lbs and w/ h/o diastolic CHF. Will decrease fluids to 64ml/hr from 125. May need to increase if becomes hypotensive again. Will add strict I/O and Daily wts. Recent chest x-ray on 09/14/16 revealed Left lower lobe atelectasis or pneumonia. Probable atelectasis or early infiltrate in the right lower lobe. Trace bilateral pleural effusions.  Results of MRI on 09/13/16 stated that examination is technically limited by motion artifact. No focal pancreatic lesion or enhancement is demonstrated. No pancreatic ductal dilatation. Cannot confirm presence or absence of the lesion identified at CT. Of note, pt with Bedisde Swallow Evaluation on 10/14/15 with recommendation of regular diet with thin liquids as swallow function was considered to be within functional limits.    Assessment / Plan / Recommendation Clinical Impression  Pt presents with mild aspiration risk when consuming dysphagia 3 diet and thin liquids via cup. Pt with effective oral manipulation of soft solids and no overt s/s of aspiration with consumtion of thin liquids. At this time, pt appears to tolerate dysphagia 3 diet with thin liquids from oropharyngeal ability. I spoke to nursing and she will consult MD regarding upgrade to full PO intake given possibility of adbominal illness. ST to sign off at this time.     Aspiration Risk  Mild aspiration risk    Diet Recommendation Dysphagia 3 (Mech soft);Thin liquid (Pending MD consultation d/t abdominal illness)   Liquid Administration via: Cup Medication Administration: Whole meds with liquid Supervision: Patient able to self feed Compensations: Slow rate;Small sips/bites Postural Changes: Seated upright at 90 degrees    Other  Recommendations Oral Care Recommendations: Oral care BID   Follow up Recommendations None             Prognosis Prognosis for Safe Diet Advancement: Good      Swallow Study   General  Date of Onset: 09/13/16 HPI: Lauren Schemmel Rhodesis a 83 y.o.femalewho presents with Sepsis likely from HCAP w/ h/o recent hospitalizations for compression fracture of T8. Of note CT showing possible lesion in pancreatic head. No Sx or labs to suggest biliary abnormality or pancreatitis. Hypotension and lactic acid improving after fluid bolus. Pt weighs only 115lbs and w/ h/o diastolic CHF. Will decrease fluids to  20ml/hr from 125. May need to increase if becomes hypotensive again. Will add strict I/O and Daily wts. Recent chest x-ray on 09/14/16 revealed Left lower lobe atelectasis or pneumonia. Probable atelectasis or early infiltrate in the right lower lobe. Trace bilateral pleural effusions. Results of MRI on 09/13/16 stated that examination is technically limited by motion artifact. No focal pancreatic lesion or enhancement is demonstrated. No pancreatic ductal dilatation. Cannot confirm presence or absence of the lesion identified at CT. Of note, pt with Bedisde Swallow Evaluation on 10/14/15 with recommendation of regular diet with thin liquids as swallow function was considered to be within functional limits.  Type of Study: Bedside Swallow Evaluation Previous Swallow Assessment: BSE on 10/14/15 - recommended regular diet with thin liquids Diet Prior to this Study: Thin liquids (Full liquids) Temperature Spikes Noted: No Respiratory Status: Nasal cannula (3 liters) History of Recent Intubation: No Behavior/Cognition: Alert;Cooperative;Pleasant mood Oral Cavity Assessment: Within Functional Limits Oral Care Completed by SLP: Recent completion by staff Oral Cavity - Dentition: Adequate natural dentition;Poor condition;Missing dentition Vision: Functional for self-feeding Self-Feeding Abilities: Able to feed self Patient Positioning: Upright in chair Baseline Vocal Quality: Normal Volitional Cough: Strong Volitional Swallow: Able to elicit    Oral/Motor/Sensory Function Overall Oral Motor/Sensory Function: Within functional limits   Ice Chips Ice chips: Within functional limits Presentation: Spoon   Thin Liquid Thin Liquid: Within functional limits Presentation: Cup;Self Fed    Nectar Thick Nectar Thick Liquid: Not tested   Honey Thick Honey Thick Liquid: Not tested   Puree Puree: Not tested Other Comments: Pt refused puree items as she had "thrown up" puree items on 09/13/16   Solid   GO   [name]  B. Rutherford Nail M.S., CCC-SLP Speech-Language Pathologist [phone] Solid: Within functional limits Presentation: Self Fed Other Comments: dysphagia 3 items d/t teeth and overall acute weakness        Lowell Mcgurk 09/14/2016,9:40 AM

## 2016-09-14 NOTE — Progress Notes (Signed)
Pharmacy Antibiotic Note  [name] is a 83 y.o. female admitted on 09/13/2016 with presumed aspiration pneumonia.  Pharmacy has been consulted for Unasyn dosing.  Plan: Unasyn 1.5gm IV q6h Monitor renal function, culture data,  Pharmacy will sign off.  Electronic alerts are in place to notify pharmacy of renal function changes that would require dose adjustment.  Height: 5\' 6"  (167.6 cm) Weight: 109 lb 14.4 oz (49.9 kg) IBW/kg (Calculated) : 59.3  Temp (24hrs), Avg:98.2 F (36.8 C), Min:98 F (36.7 C), Max:98.4 F (36.9 C)   Recent Labs Lab 09/13/16 0445 09/13/16 0508 09/13/16 0833 09/13/16 1632 09/14/16 0539  WBC 7.2  --   --  9.8 10.9*  CREATININE 0.94  --   --  0.70 0.68  LATICACIDVEN  --  3.14* 2.84* 0.8  --     Estimated Creatinine Clearance: 36.8 mL/min (by C-G formula based on SCr of 0.68 mg/dL).    Allergies  Allergen Reactions  . Lactose Intolerance (Gi) Other (See Comments)    Reaction:  GI upset     Antimicrobials this admission: Vanc 1/8 >> 1/9 Cefepime 1/8 >> 1/9 Unasyn 1/9 >>  Dose adjustments this admission:  Microbiology results: 1/8 BCx: pending 1/8 UCx: pending 1/9 MRSA PCR: negative 1/8 Cdiff negative  Thank you for allowing pharmacy to be a part of this patient's care.  [employer], Pharm.D., BCPS Clinical Pharmacist Pager [phone] 09/14/2016 2:48 PM

## 2016-09-14 NOTE — ED Notes (Signed)
Pt cleaned and new brief placed. Green diarrhea observed in brief

## 2016-09-14 NOTE — Evaluation (Signed)
Physical Therapy Evaluation Patient Details Name: DEKYRA CARDO MRN: [id number] DOB: 1924/06/09 Today's Date: 09/14/2016   History of Present Illness  83 y.o. female with medical history significant for Afib, GERD, hypothyroidism, RA on plaquenil and prednisone, COPD, recent admission to the hospital for T8 vertebral compression fracture 12/25, brought to the ED with acute onset of nausea, vomiting, diarrhea, as well as chills without subjective fevers.  Clinical Impression  Pt presents with impairments in mobility, transfers and balance and will benefit from skilled PT to address deficits and improve functional mobility    Follow Up Recommendations SNF    Equipment Recommendations  None recommended by PT    Recommendations for Other Services       Precautions / Restrictions Precautions Precautions: Fall Restrictions Weight Bearing Restrictions: No      Mobility  Bed Mobility Overal bed mobility: Needs Assistance Bed Mobility: Supine to Sit       Sit to supine: Mod assist   General bed mobility comments: Pt requires assist for trunk and LEs for sitting up with HOB raised. mod A to scoot to edge of bed  Transfers Overall transfer level: Needs assistance Equipment used: None Transfers: Sit to/from [facility] Sit to Stand: Max assist Stand pivot transfers: Total assist       General transfer comment: Pt requires tactile cues and manual facilitation for upright standing, anterior wt shift and wt shifting to pivot to chair  Ambulation/Gait                Stairs            Wheelchair Mobility    Modified Rankin (Stroke Patients Only)       Balance             Standing balance-Leahy Scale: Zero Standing balance comment: posterior lean                             Pertinent Vitals/Pain Pain Assessment: No/denies pain Faces Pain Scale: No hurt    Home Living Family/patient expects to be discharged to:: Skilled  nursing facility   Available Help at Discharge: [place] Type of Home: [place]                Prior Function Level of Independence: Needs assistance   Gait / Transfers Assistance Needed: Pt reports using w/c for all mobility  ADL's / Homemaking Assistance Needed: Pt reports total assistance for all ADLs  Comments: sponge bathes only     Hand Dominance   Dominant Hand: Right    Extremity/Trunk Assessment   Upper Extremity Assessment Upper Extremity Assessment: Generalized weakness    Lower Extremity Assessment Lower Extremity Assessment: Generalized weakness    Cervical / Trunk Assessment Cervical / Trunk Assessment: Kyphotic  Communication   Communication: HOH  Cognition Arousal/Alertness: Awake/alert Behavior During Therapy: WFL for tasks assessed/performed Overall Cognitive Status: Within Functional Limits for tasks assessed                      General Comments      Exercises     Assessment/Plan    PT Assessment Patient needs continued PT services  PT Problem List Decreased strength;Decreased balance;Decreased range of motion;Decreased mobility;Decreased coordination;Decreased activity tolerance;Decreased knowledge of use of DME;Cardiopulmonary status limiting activity          PT Treatment Interventions DME instruction;Gait training;Functional mobility training;Therapeutic activities;Therapeutic exercise;Balance training;Patient/family education;Neuromuscular  re-education;Modalities    PT Goals (Current goals can be found in the Care Plan section)  Acute Rehab PT Goals Patient Stated Goal: feel better PT Goal Formulation: With patient Time For Goal Achievement: 09/21/16 Potential to Achieve Goals: Good    Frequency Min 2X/week   Barriers to discharge        Co-evaluation               End of Session Equipment Utilized During Treatment: Gait belt Activity Tolerance: Patient tolerated  treatment well Patient left: in chair;with call bell/phone within reach;with chair alarm set Nurse Communication: Mobility status         Time: [id number] PT Time Calculation (min) (ACUTE ONLY): 18 min   Charges:   PT Evaluation $PT Eval Moderate Complexity: 1 Procedure PT Treatments $Therapeutic Activity: 8-22 mins   PT G Codes:        Amanada Philbrick 10/08/2016, 11:27 AM

## 2016-09-14 NOTE — Progress Notes (Signed)
[facility] Progress Note  Patient: [name] [id number]   PCP: Willodean Rosenthal, MD DOB: [birth year]   DOA: 09/13/2016   DOS: 09/14/2016   Date of Service: the patient was seen and examined on 09/14/2016  Brief hospital course: Pt. with PMH of Rheumatoid arthritis, hypothyroidism, COPD, A. fib, GERD; admitted on 09/13/2016, with complaint of nausea vomiting and diarrhea, was found to have healthcare associated pneumonia. Currently further plan is IV antibiotics.  Assessment and Plan: 1. Suspected Sepsis likely due to respiratory source (HCAP) organism unknown, in a patient with a history of COPD and recent hospitalization Patient meets criteria given  Fever up to 101, evidence of organ dysfunction with elevated lactic acid, initial hypotension)    Antibiotics delivered in the ED with Vanc and Zosyn. DuoNebs prn  Follow resp, sputum  and blood cultures  SLP recommends dysphagia type III diet Antibiotics changed to Unasyn. MRSA PCR negative.  2. Intractable nausea and vomiting with diarrhea CT abdomen and pelvis low-density seen at the pancreatic head, which could represent pancreatitis versus pancreatic necrosis, but normal LFTs and lipase. No bleeding issues noted. No apparent abdominal pain on exam. WBC normal . Feeling better this morning, will advance diet. IV  Zofran, analgesics.  3.hypoglycemia. Continue D5 infusion.  4.hypokalemia. Significant and severe. Replacing with IV and oral potassium. Recheck in the afternoon. Unable to tolerate D5 with 40 of potassium therefore extra D5 ordered which both were running at 100 mL/h total. Potassium is more than 3 in the sugar levels are stable and discontinue D5 infusion both of them.  5. History of closed wedge compression fracture of T8 vertebra during recent admission 12/25-12/28,  currently on SNF, not a candidate for kyphoplasty as per recent Neurosurgery notes Treat symptomatically with pain meds, neurontin Patient to  follow up with Dr. Sherwood Gambler in 1 month as OP  6. Hypothyroidism: -Continue home Synthroid  7. Atrial Fibrillation    Rate controlled -Continue meds  8. Rheumathoid Arthritis Continue Plaquenil and Steroids   Bowel regimen: last BM 09/14/2016 Diet: Dysphagia type III diet DVT Prophylaxis: subcutaneous Heparin  Advance goals of care discussion: DNR/DNI Palliative care consulted for multiple falls as well as presentation with sepsis criteria.  Family Communication: no family was present at bedside, at the time of interview.   Disposition:  Discharge to SNF. Expected discharge date: 09/16/2016, improvement in symptomology  Consultants: Palliative care Procedures: None  Antibiotics: Anti-infectives    Start     Dose/Rate Route Frequency Ordered Stop   09/14/16 1300  ampicillin-sulbactam (UNASYN) 1.5 g in sodium chloride 0.9 % 50 mL IVPB     1.5 g 100 mL/hr over 30 Minutes Intravenous Every 6 hours 09/14/16 1226     09/13/16 2200  hydroxychloroquine (PLAQUENIL) tablet 200 mg     200 mg Oral Daily at bedtime 09/13/16 1030     09/13/16 2000  vancomycin (VANCOCIN) 500 mg in sodium chloride 0.9 % 100 mL IVPB  Status:  Discontinued     500 mg 100 mL/hr over 60 Minutes Intravenous Every 12 hours 09/13/16 1042 09/14/16 1142   09/13/16 1100  ceFEPIme (MAXIPIME) 2 g in dextrose 5 % 50 mL IVPB  Status:  Discontinued     2 g 100 mL/hr over 30 Minutes Intravenous Every 24 hours 09/13/16 1030 09/14/16 1142   09/13/16 0430  piperacillin-tazobactam (ZOSYN) IVPB 3.375 g     3.375 g 100 mL/hr over 30 Minutes Intravenous  Once 09/13/16 0426 09/13/16 0620   09/13/16 0430  vancomycin (VANCOCIN) IVPB 1000 mg/200 mL premix     1,000 mg 200 mL/hr over 60 Minutes Intravenous  Once 09/13/16 0426 09/13/16 0849        Subjective: Feeling better, no nausea no vomiting. No abdominal pain.  Objective: Physical Exam: Vitals:   09/14/16 0526 09/14/16 0810 09/14/16 1150 09/14/16 1628  BP:  (!)  120/49 (!) 142/61 (!) 155/67  Pulse:  66 69 83  Resp:  18 18 (!) 22  Temp:  98.1 F (36.7 C) 98.4 F (36.9 C) 98.7 F (37.1 C)  TempSrc:  Oral Oral Axillary  SpO2:  100% 98% 100%  Weight: 49.9 kg (109 lb 14.4 oz)     Height:        Intake/Output Summary (Last 24 hours) at 09/14/16 1742 Last data filed at 09/14/16 1250  Gross per 24 hour  Intake              575 ml  Output                1 ml  Net              574 ml   Filed Weights   09/14/16 0049 09/14/16 0526  Weight: 49.4 kg (109 lb) 49.9 kg (109 lb 14.4 oz)    General: Alert, Awake and Oriented to  Time, Place and Person. Appear in mild distress, affect appropriate Eyes: PERRL, Conjunctiva normal ENT: Oral Mucosa clear moist. Neck: no JVD, no Abnormal Mass Or lumps Cardiovascular: S1 and S2 Present, aortic systolic Murmur, Respiratory: Bilateral Air entry equal and Decreased, no use of accessory muscle, Clear to Auscultation, no Crackles, no wheezes Abdomen: Bowel Sound present, Soft and no tenderness Skin: no redness, no Rash, no induration Extremities: no Pedal edema, no calf tenderness Neurologic: Grossly no focal neuro deficit. Bilaterally Equal motor strength  Data Reviewed: CBC:  Recent Labs Lab 09/13/16 0445 09/13/16 1632 09/14/16 0539  WBC 7.2 9.8 10.9*  NEUTROABS 6.4 8.1*  --   HGB 13.7 10.5* 11.2*  HCT 42.5 31.9* 34.9*  MCV 103.2* 101.6* 103.9*  PLT 427* 312 [id number]   Basic Metabolic Panel:  Recent Labs Lab 09/13/16 0445 09/13/16 1632 09/14/16 0539  NA 134* 138 138  K 4.1 2.9* 2.5*  CL 95* 110 106  CO2 24 21* 24  GLUCOSE 181* 98 72  BUN 22* 12 10  CREATININE 0.94 0.70 0.68  CALCIUM 8.6* 7.3* 8.1*    Liver Function Tests:  Recent Labs Lab 09/13/16 0445 09/13/16 1632 09/14/16 0539  AST 37 24 27  ALT 20 17 20   ALKPHOS 110 78 90  BILITOT 1.2 0.5 0.2*  PROT 6.3* 4.7* 5.2*  ALBUMIN 3.6 2.4* 2.6*    Recent Labs Lab 09/13/16 0445  LIPASE 12   No results for input(s): AMMONIA in  the last 168 hours. Coagulation Profile:  Recent Labs Lab 09/13/16 1632  INR 1.14   Cardiac Enzymes: No results for input(s): CKTOTAL, CKMB, CKMBINDEX, TROPONINI in the last 168 hours. BNP (last 3 results) No results for input(s): PROBNP in the last 8760 hours.  CBG:  Recent Labs Lab 09/14/16 0114 09/14/16 0136 09/14/16 0809 09/14/16 1209 09/14/16 1727  GLUCAP 64* 95 106* 102* 118*    Studies: Dg Chest 2 View  Result Date: 09/14/2016 CLINICAL DATA:  Sepsis, weakness. History of COPD, atrial fibrillation, hypotension, healthcare associated pneumonia. EXAM: CHEST  2 VIEW COMPARISON:  Portable chest x-ray of September 13, 2016 and thoracic spine series of September 01, 2016. FINDINGS: The lungs are well-expanded. There is increased density in the left lower lobe consistent with atelectasis or pneumonia. Hazy increased density in the right infrahilar region is slightly less conspicuous today. The cardiac silhouette is normal in size. There is calcification in the wall of the aortic arch. The pulmonary vascularity is not engorged. There is known 50% wedge compression of the body of T8 and of T12. The T12 compression has been treated with kyphoplasty. IMPRESSION: Left lower lobe atelectasis or pneumonia. Probable atelectasis or early infiltrate in the right lower lobe. Trace bilateral pleural effusions. Thoracic aortic atherosclerosis. Electronically Signed   By: David  Martinique M.D.   On: 09/14/2016 07:46   Mr Abdomen W Wo Contrast  Result Date: 09/13/2016 CLINICAL DATA:  Acute generalized abdominal pain and vomiting. CT suggested possible pancreatic lesion. EXAM: MRI ABDOMEN WITHOUT AND WITH CONTRAST TECHNIQUE: Multiplanar multisequence MR imaging of the abdomen was performed both before and after the administration of intravenous contrast. CONTRAST:  17mL MULTIHANCE GADOBENATE DIMEGLUMINE 529 MG/ML IV SOLN COMPARISON:  CT abdomen and pelvis 09/13/2016, 04/05/2014, 11/20/2012 FINDINGS: Lower  chest: Consolidation or atelectasis in both lung bases. Changes may represent pneumonia. Minimal pleural effusions. Hepatobiliary: No focal liver lesions. Gallbladder is unremarkable. Mild extrahepatic bile duct dilatation is likely normal for patient's age. Pancreas: Evaluation of the pancreas is limited due to motion artifact. Pancreatic duct does not appear dilated. No enhancing mass lesions appreciated. No peripancreatic fluid collection or infiltration. Spleen:  Within normal limits in size and appearance. Adrenals/Urinary Tract: No masses identified. No evidence of hydronephrosis. Stomach/Bowel: Visualized portions within the abdomen are unremarkable. Vascular/Lymphatic: No pathologically enlarged lymph nodes identified. No abdominal aortic aneurysm demonstrated. Other: Examination is technically limited due to motion artifact. Significant lesions could be obscured to visualization. Musculoskeletal: Lumbar scoliosis with convex ED towards the right. Degenerative changes in the visualized lumbar spine. No expansile bone lesions appreciated. Central disc protrusion or osteophyte cause effacement of the thecal sac at L1-2. Moderate central canal stenosis at L2-3 and L3-4. IMPRESSION: Examination is technically limited by motion artifact. No focal pancreatic lesion or enhancement is demonstrated. No pancreatic ductal dilatation. Cannot confirm presence or absence of the lesion identified at CT. Electronically Signed   By: Lucienne Capers M.D.   On: 09/13/2016 22:38     Scheduled Meds: . ampicillin-sulbactam (UNASYN) IV  1.5 g Intravenous Q6H  . artificial tears  1 application Left Eye Daily  . aspirin  81 mg Oral Daily  . enoxaparin (LOVENOX) injection  40 mg Subcutaneous Q24H  . folic acid  1 mg Oral Daily  . hydroxychloroquine  200 mg Oral QHS  . levothyroxine  50 mcg Oral QAC breakfast  . morphine  15 mg Oral Q12H  . pantoprazole  40 mg Oral QAC breakfast  . predniSONE  5 mg Oral Q breakfast  .  sertraline  75 mg Oral Daily  . cyanocobalamin  100 mcg Oral Daily   Continuous Infusions: . dextrose 5 % with kcl 50 mL/hr at 09/14/16 1130  . dextrose 50 mL (09/14/16 1115)   PRN Meds: acetaminophen **OR** acetaminophen, bisacodyl, ipratropium-albuterol, meclizine, ondansetron **OR** ondansetron (ZOFRAN) IV, oxyCODONE, senna-docusate, traZODone  Time spent: 30 minutes  Author: Berle Mull, MD [facility] Pager: [phone] 09/14/2016 5:42 PM  If 7PM-7AM, please contact night-coverage at [email or web address], password [facility]

## 2016-09-14 NOTE — Care Management Note (Signed)
Case Management Note  Patient Details  Name: [name] MRN: [id number] Date of Birth: 03/06/1924  Subjective/Objective: Pt presented for Sepsis- HCAP and with compression fracture. Pt is from [facility]. CSW is aware.                   Action/Plan: CM will continue to monitor for disposition needs.   Expected Discharge Date:                  Expected Discharge Plan:  Skilled Nursing Facility  In-House Referral:  Clinical Social Work  Discharge planning Services  CM Consult  Post Acute Care Choice:  NA Choice offered to:  NA  DME Arranged:  N/A DME Agency:  NA  HH Arranged:  NA HH Agency:  NA  Status of Service:  Completed, signed off  If discussed at [place] of Stay Meetings, dates discussed:    Additional Comments:  Bethena Roys, RN 09/14/2016, 11:05 AM

## 2016-09-14 NOTE — Evaluation (Signed)
Occupational Therapy Evaluation Patient Details Name: [name] MRN: [id number] DOB: 14-Aug-1924 Today's Date: 09/14/2016    History of Present Illness 83 y.o. female with medical history significant for Afib, GERD, hypothyroidism, RA on plaquenil and prednisone, COPD, recent admission to the hospital for T8 vertebral compression fracture 12/25, brought to the ED with acute onset of nausea, vomiting, diarrhea, as well as chills without subjective fevers.   Clinical Impression   Pt admitted with above and presents to OT with impairments impacting her ability to complete ADLs and functional mobility.  Pt required max-total assist sit > stand and stand pivot transfers and decreased activity tolerance.  Pt reports being at SNF prior to admission and requiring total assist for ADLs and use of w/c for mobility.  Pt will benefit from OT acutely to increase participation in self-care tasks to prepare for d/c back to SNF.      Follow Up Recommendations  SNF;Supervision/Assistance - 24 hour    Equipment Recommendations  None recommended by OT    Recommendations for Other Services       Precautions / Restrictions Precautions Precautions: Fall      Mobility Bed Mobility Overal bed mobility: Needs Assistance Bed Mobility: Sit to Supine           General bed mobility comments: Pt able to lay head down towards pillow while therapist lifting both legs into bed.  Utilized bed pad to assist with positioning in bed.    Transfers Overall transfer level: Needs assistance Equipment used:  (gait belt) Transfers: Sit to/from [employer] Transfers Sit to Stand: Max assist;Total assist Stand pivot transfers: Total assist       General transfer comment: Max- total assist sit > stand with pt requiring tactile cues and manual facilitation for anterior weight shift to acheive upright standing.           ADL Overall ADL's : Needs assistance/impaired                          Toilet Transfer: Stand-pivot;Total assistance;BSC Toilet Transfer Details (indicate cue type and reason): Total assist lifting/lowering with pt initiating approx 20% Toileting- Clothing Manipulation and Hygiene: Total assistance         General ADL Comments: Pt reports total assist for all self-care tasks.  Required max-total assist for sit <> stand and stand pivot transfers.  Pt unable to tolerate standing >20 seconds before requiring seated rest break.  Returned to bed due to fatigue.     Vision Vision Assessment?: No apparent visual deficits          Pertinent Vitals/Pain Pain Assessment: No/denies pain Faces Pain Scale: No hurt     Hand Dominance Right   Extremity/Trunk Assessment Upper Extremity Assessment Upper Extremity Assessment: Generalized weakness   Lower Extremity Assessment Lower Extremity Assessment: Generalized weakness   Cervical / Trunk Assessment Cervical / Trunk Assessment: Kyphotic   Communication Communication Communication: HOH   Cognition Arousal/Alertness: Awake/alert Behavior During Therapy: WFL for tasks assessed/performed Overall Cognitive Status: Within Functional Limits for tasks assessed                                Home Living Family/patient expects to be discharged to:: Skilled nursing facility   Available Help at Discharge: [place] Type of Home: [place]  Prior Functioning/Environment Level of Independence: Needs assistance  Gait / Transfers Assistance Needed: Pt reports using w/c for all mobility ADL's / Homemaking Assistance Needed: Pt reports total assistance for all ADLs   Comments: sponge bathes only        OT Problem List: Decreased strength;Decreased activity tolerance;Impaired balance (sitting and/or standing);Decreased safety awareness;Cardiopulmonary status limiting activity   OT Treatment/Interventions: Self-care/ADL  training;DME and/or AE instruction;Patient/family education    OT Goals(Current goals can be found in the care plan section) Acute Rehab OT Goals Patient Stated Goal: to be comfortable OT Goal Formulation: With patient Time For Goal Achievement: 09/28/16 Potential to Achieve Goals: Fair  OT Frequency: Min 1X/week   Barriers to D/C: Decreased caregiver support             End of Session Equipment Utilized During Treatment: Gait belt Nurse Communication: Mobility status  Activity Tolerance: Patient limited by fatigue Patient left: in bed;with call bell/phone within reach;with bed alarm set;with nursing/sitter in room   Time: [id number] OT Time Calculation (min): 14 min Charges:  OT General Charges $OT Visit: 1 Procedure OT Evaluation $OT Eval Moderate Complexity: 1 Procedure G-CodesSimonne Come, [id number] 09/14/2016, 10:12 AM

## 2016-09-14 NOTE — Progress Notes (Signed)
Responded to spiritual care consult to provide continued support to patient.  Two grandsons at bedside.  Patient said she was getting sleepy and want to rest.  Provided word of encouragement and blessings. Family appreciated visit and requested continued prayer for patient.   09/14/16 1526  Clinical Encounter Type  Visited With Patient and family together;Health care provider  Visit Type Initial;Spiritual support  Referral From Other (Comment) (spiritual Care consult)  Spiritual Encounters  Spiritual Needs Prayer;Emotional  Stress Factors  Patient Stress Factors None identified  Family Stress Factors None identified  Jaclynn Major, [place]

## 2016-09-15 DIAGNOSIS — J690 Pneumonitis due to inhalation of food and vomit: Secondary | ICD-10-CM

## 2016-09-15 DIAGNOSIS — E039 Hypothyroidism, unspecified: Secondary | ICD-10-CM

## 2016-09-15 DIAGNOSIS — R1084 Generalized abdominal pain: Secondary | ICD-10-CM

## 2016-09-15 DIAGNOSIS — A419 Sepsis, unspecified organism: Principal | ICD-10-CM

## 2016-09-15 DIAGNOSIS — Z7952 Long term (current) use of systemic steroids: Secondary | ICD-10-CM

## 2016-09-15 DIAGNOSIS — R54 Age-related physical debility: Secondary | ICD-10-CM

## 2016-09-15 DIAGNOSIS — K869 Disease of pancreas, unspecified: Secondary | ICD-10-CM

## 2016-09-15 DIAGNOSIS — E162 Hypoglycemia, unspecified: Secondary | ICD-10-CM | POA: Diagnosis present

## 2016-09-15 DIAGNOSIS — J449 Chronic obstructive pulmonary disease, unspecified: Secondary | ICD-10-CM

## 2016-09-15 DIAGNOSIS — J189 Pneumonia, unspecified organism: Secondary | ICD-10-CM

## 2016-09-15 DIAGNOSIS — E876 Hypokalemia: Secondary | ICD-10-CM

## 2016-09-15 DIAGNOSIS — S22060D Wedge compression fracture of T7-T8 vertebra, subsequent encounter for fracture with routine healing: Secondary | ICD-10-CM

## 2016-09-15 DIAGNOSIS — I480 Paroxysmal atrial fibrillation: Secondary | ICD-10-CM

## 2016-09-15 LAB — CBC
HEMATOCRIT: 32.6 % — AB (ref 36.0–46.0)
HEMOGLOBIN: 10.7 g/dL — AB (ref 12.0–15.0)
MCH: 33.8 pg (ref 26.0–34.0)
MCHC: 32.8 g/dL (ref 30.0–36.0)
MCV: 102.8 fL — ABNORMAL HIGH (ref 78.0–100.0)
Platelets: 312 10*3/uL (ref 150–400)
RBC: 3.17 MIL/uL — AB (ref 3.87–5.11)
RDW: 14.7 % (ref 11.5–15.5)
WBC: 9.8 10*3/uL (ref 4.0–10.5)

## 2016-09-15 LAB — GLUCOSE, CAPILLARY
GLUCOSE-CAPILLARY: 112 mg/dL — AB (ref 65–99)
GLUCOSE-CAPILLARY: 160 mg/dL — AB (ref 65–99)
GLUCOSE-CAPILLARY: 141 mg/dL — AB (ref 65–99)
GLUCOSE-CAPILLARY: 103 mg/dL — AB (ref 65–99)

## 2016-09-15 LAB — BASIC METABOLIC PANEL
ANION GAP: 8 (ref 5–15)
BUN: <5 mg/dL — ABNORMAL LOW (ref 6–20)
CO2: 26 mmol/L (ref 22–32)
Calcium: 8.4 mg/dL — ABNORMAL LOW (ref 8.9–10.3)
Chloride: 103 mmol/L (ref 101–111)
Creatinine, Ser: 0.62 mg/dL (ref 0.44–1.00)
GFR calc Af Amer: >60 mL/min (ref >60)
GLUCOSE: 119 mg/dL — AB (ref 65–99)
POTASSIUM: 3 mmol/L — AB (ref 3.5–5.1)
Sodium: 137 mmol/L (ref 135–145)

## 2016-09-15 LAB — MAGNESIUM: Magnesium: 2 mg/dL (ref 1.7–2.4)

## 2016-09-15 MED ORDER — POTASSIUM CHLORIDE CRYS ER 20 MEQ PO TBCR
40.0000 meq | EXTENDED_RELEASE_TABLET | ORAL | Status: AC
  Administered 2016-09-15 (×3): 40 meq via ORAL
  Filled 2016-09-15 (×2): qty 2

## 2016-09-15 MED ORDER — POTASSIUM CHLORIDE CRYS ER 20 MEQ PO TBCR: 40.0000 meq | EXTENDED_RELEASE_TABLET | Freq: Once | ORAL | Status: DC

## 2016-09-15 NOTE — Clinical Social Work Note (Signed)
Clinical Social Work Assessment  Patient Details  Name: [name] MRN: [id number] Date of Birth: 09-Oct-1923  Date of referral:  09/14/16               Reason for consult:  Facility Placement, Discharge Planning                Permission sought to share information with:  Facility Sport and exercise psychologist, Family Supports Permission granted to share information::  Yes, Verbal Permission Granted  Name::     Careers adviser::  [facility]  Relationship::  Daughter  Contact Information:     Housing/Transportation Living arrangements for the past 2 months:  [place] of Information:  Adult Children Patient Interpreter Needed:  None Criminal Activity/Legal Involvement Pertinent to Current Situation/Hospitalization:  No - Comment as needed Significant Relationships:  Adult Children Lives with:  Facility Resident Do you feel safe going back to the place where you live?  Yes Need for family participation in patient care:  Yes (Comment)  Care giving concerns:  The patient shares that she lives at [facility] and plans to return at time of discharge.    Social Worker assessment / plan:  CSW met with patient at bedside to complete assessment. The patient is very pleasant and welcoming of CSW. She states that she was admitted from [facility] and will return at time of discharge. The patient states that her main support is her daughter who actually works for [employer]. CSW will contact [facility] and make arrangements for the patient's return when appropriate.   Employment status:  Retired Forensic scientist:  [employer] PT Recommendations:  [place] / Referral to community resources:  [place]  Patient/Family's Response to care:  The patient is happy with the care she is receiving. She appreciates CSW's involvement and assistance with coordination with [facility].  Patient/Family's Understanding of and Emotional  Response to Diagnosis, Current Treatment, and Prognosis:  The patient appears to have a good understanding of her condition and prognosis. The patient appears tired and looks forward to going home.   Emotional Assessment Appearance:  Appears stated age Attitude/Demeanor/Rapport:  Other (The patient is appropriate and welcoming of CSW. Appears tired.) Affect (typically observed):  Accepting, Appropriate, Calm, Pleasant Orientation:  Oriented to Self, Oriented to Place, Oriented to  Time, Oriented to Situation Alcohol / Substance use:  Not Applicable Psych involvement (Current and /or in the community):  No (Comment)  Discharge Needs  Concerns to be addressed:  Discharge Planning Concerns Readmission within the last 30 days:  Yes Current discharge risk:  Chronically ill, Physical Impairment Barriers to Discharge:  Continued Medical Work up   Fredderick Phenix B, LCSW 09/15/2016, 9:05 AM

## 2016-09-15 NOTE — Progress Notes (Signed)
Progress Note    [name]  [id number] DOB: 24-Sep-1923  DOA: 09/13/2016 PCP: Willodean Rosenthal, MD    Brief Narrative:   Chief complaint: Follow-up aspiration pneumonia  [name] is an 83 y.o. female with PMH of Rheumatoid arthritis, hypothyroidism, COPD, A. fib, GERD; admitted on 09/13/2016, with complaint of nausea vomiting and diarrhea, was found to have healthcare associated pneumonia.  Assessment/Plan:   Principal Problem:   Sepsis secondary to Aspiration pneumonia (HCC)/HCAP in the setting of nausea and vomiting Records reviewed. Patient met sepsis criteria on admission and was initially treated with empiric vancomycin and Zosyn. She has a known history of dysphasia and a review of her chest x-ray showed that the infiltrate appears at the termination of the right bronchus which is also consistent with aspiration. Continue dysphagia 3 diet per speech therapy recommendations. Continue Unasyn, as the patient desires the full scope of treatment. Follow-up blood cultures.   Active Problems:   Hypokalemia We'll replace through the oral route. Magnesium okay.    Hypoglycemia Managed with a dextrose infusion. Resolved.    Hypothyroidism Continue Synthroid.    Rheumatoid arthritis ([place]) long term current use of systemic steroids Continue pain management with MS Contin and steroids for inflammation. Continue Plaquenil.    Abdominal pain CT abdomen and pelvis low-density seen at the pancreatic head, which could represent pancreatitis versus pancreatic necrosis, but normal LFTs and lipase.Resolved at this point. Would not workup any further. Diet advanced.    COPD (chronic obstructive pulmonary disease) ([place]) Compensated.    Paroxysmal Atrial fibrillation ([place]) Not currently on blood thinners. Rate controlled. Continue aspirin. Discontinue telemetry, patient has been maintaining sinus rhythm.    GERD (gastroesophageal reflux disease)/hiatal hernia Continue  Protonix.    Closed wedge compression fracture of T8 vertebra ([place]) Has been evaluated for kyphoplasty but is not a candidate. Continue symptomatic treatment with pain medications. Patient will follow-up with Dr. Sherwood Gambler in 1 month as OP. Back brace ordered.    Frailty/Palliative care encounter/Goals of care, counseling/discussion Palliative care team met with the patient and her family today. Desires the full scope of treatment but is a DO NOT RESUSCITATE otherwise. Would not want a feeding tube.   Family Communication/Anticipated D/C date and plan/Code Status   DVT prophylaxis: Lovenox ordered. Code Status: Full Code.  Family Communication: Daughter at the bedside. Disposition Plan: SNF in 24-48 hours if stable.   Medical Consultants:    None.   Procedures:    None  Anti-Infectives:    Unasyn 09/14/16--->  Vancomycin 09/13/16---> 09/14/16  Zosyn 1/8/181  Cefepime 09/13/16---> 09/14/16  Subjective:   The patient reports that She continues to have pain in the left rib area. Review of symptoms is positive for nonproductive cough and poor appetite and negative for back pain, nausea/vomiting.  Objective:    Vitals:   09/15/16 0400 09/15/16 0500 09/15/16 0746 09/15/16 1117  BP:  (!) 136/48 (!) 144/53 (!) 137/49  Pulse: 72 88 72 79  Resp: (!) '25 20 17 '$ (!) 22  Temp:  97.5 F (36.4 C) 97.6 F (36.4 C) 97.9 F (36.6 C)  TempSrc:  Axillary Oral Oral  SpO2: 97% 99% 100%   Weight:  49.9 kg (110 lb 0.2 oz)    Height:        Intake/Output Summary (Last 24 hours) at 09/15/16 1440 Last data filed at 09/15/16 0900  Gross per 24 hour  Intake  2180 ml  Output                0 ml  Net             2180 ml   Filed Weights   09/14/16 0049 09/14/16 0526 09/15/16 0500  Weight: 49.4 kg (109 lb) 49.9 kg (109 lb 14.4 oz) 49.9 kg (110 lb 0.2 oz)    Exam: General exam: Appears comfortable.  Respiratory system: Diminished to auscultation. Respiratory effort  normal. Cardiovascular system: S1 & S2 heard, RRR. No JVD,  rubs, gallops or clicks. No murmurs. Gastrointestinal system: Abdomen is nondistended, soft and nontender. No organomegaly or masses felt. Normal bowel sounds heard. Central nervous system: Alert and oriented. No focal neurological deficits. Extremities: No clubbing,  or cyanosis. No edema. Extensive lower extremity ecchymosis. Skin: No rashes, lesions or ulcers.  Psychiatry: Judgement and insight appear mildly decreased. Mood & affect mildly anxious.   Data Reviewed:   I have personally reviewed following labs and imaging studies:  Labs: Basic Metabolic Panel:  Recent Labs Lab 09/13/16 0445 09/13/16 1632 09/14/16 0539 09/14/16 1809 09/15/16 0507  NA 134* 138 138 136 137  K 4.1 2.9* 2.5* 3.1* 3.0*  CL 95* 110 106 103 103  CO2 24 21* '24 26 26  '$ GLUCOSE 181* 98 72 123* 119*  BUN 22* 12 10 5* <5*  CREATININE 0.94 0.70 0.68 0.61 0.62  CALCIUM 8.6* 7.3* 8.1* 8.4* 8.4*  MG  --   --   --   --  2.0   GFR Estimated Creatinine Clearance: 36.8 mL/min (by C-G formula based on SCr of 0.62 mg/dL). Liver Function Tests:  Recent Labs Lab 09/13/16 0445 09/13/16 1632 09/14/16 0539  AST 37 24 27  ALT '20 17 20  '$ ALKPHOS 110 78 90  BILITOT 1.2 0.5 0.2*  PROT 6.3* 4.7* 5.2*  ALBUMIN 3.6 2.4* 2.6*    Recent Labs Lab 09/13/16 0445  LIPASE 12   No results for input(s): AMMONIA in the last 168 hours. Coagulation profile  Recent Labs Lab 09/13/16 1632  INR 1.14    CBC:  Recent Labs Lab 09/13/16 0445 09/13/16 1632 09/14/16 0539 09/15/16 0507  WBC 7.2 9.8 10.9* 9.8  NEUTROABS 6.4 8.1*  --   --   HGB 13.7 10.5* 11.2* 10.7*  HCT 42.5 31.9* 34.9* 32.6*  MCV 103.2* 101.6* 103.9* 102.8*  PLT 427* 312 320 312   Cardiac Enzymes: No results for input(s): CKTOTAL, CKMB, CKMBINDEX, TROPONINI in the last 168 hours. BNP (last 3 results) No results for input(s): PROBNP in the last 8760 hours. CBG:  Recent Labs Lab  09/14/16 1209 09/14/16 1727 09/14/16 2106 09/15/16 0745 09/15/16 1207  GLUCAP 102* 118* 130* 112* 160*   D-Dimer: No results for input(s): DDIMER in the last 72 hours. Hgb A1c: No results for input(s): HGBA1C in the last 72 hours. Lipid Profile: No results for input(s): CHOL, HDL, LDLCALC, TRIG, CHOLHDL, LDLDIRECT in the last 72 hours. Thyroid function studies: No results for input(s): TSH, T4TOTAL, T3FREE, THYROIDAB in the last 72 hours.  Invalid input(s): FREET3 Anemia work up: No results for input(s): VITAMINB12, FOLATE, FERRITIN, TIBC, IRON, RETICCTPCT in the last 72 hours. Sepsis Labs:  Recent Labs Lab 09/13/16 0445 09/13/16 0508 09/13/16 0833 09/13/16 1632 09/14/16 0539 09/15/16 0507  PROCALCITON  --   --   --  7.73  --   --   WBC 7.2  --   --  9.8 10.9* 9.8  LATICACIDVEN  --  3.14* 2.84* 0.8  --   --     Microbiology Recent Results (from the past 240 hour(s))  Blood Culture (routine x 2)     Status: None (Preliminary result)   Collection Time: 09/13/16  4:45 AM  Result Value Ref Range Status   Specimen Description BLOOD RIGHT ANTECUBITAL  Final   Special Requests BOTTLES DRAWN AEROBIC AND ANAEROBIC 5CC EA  Final   Culture NO GROWTH 2 DAYS  Final   Report Status PENDING  Incomplete  Blood Culture (routine x 2)     Status: None (Preliminary result)   Collection Time: 09/13/16  5:00 AM  Result Value Ref Range Status   Specimen Description BLOOD RIGHT ANTECUBITAL  Final   Special Requests IN PEDIATRIC BOTTLE 3CC  Final   Culture NO GROWTH 2 DAYS  Final   Report Status PENDING  Incomplete  C difficile quick scan w PCR reflex     Status: None   Collection Time: 09/13/16  6:21 AM  Result Value Ref Range Status   C Diff antigen NEGATIVE NEGATIVE Final   C Diff toxin NEGATIVE NEGATIVE Final   C Diff interpretation No C. difficile detected.  Final  Urine culture     Status: None   Collection Time: 09/13/16  6:26 AM  Result Value Ref Range Status   Specimen  Description URINE, RANDOM  Final   Special Requests NONE  Final   Culture NO GROWTH  Final   Report Status 09/14/2016 FINAL  Final  MRSA PCR Screening     Status: None   Collection Time: 09/14/16 12:58 AM  Result Value Ref Range Status   MRSA by PCR NEGATIVE NEGATIVE Final    Comment:        The GeneXpert MRSA Assay (FDA approved for NASAL specimens only), is one component of a comprehensive MRSA colonization surveillance program. It is not intended to diagnose MRSA infection nor to guide or monitor treatment for MRSA infections.     Radiology: Dg Chest 2 View  Result Date: 09/14/2016 CLINICAL DATA:  Sepsis, weakness. History of COPD, atrial fibrillation, hypotension, healthcare associated pneumonia. EXAM: CHEST  2 VIEW COMPARISON:  Portable chest x-ray of September 13, 2016 and thoracic spine series of September 01, 2016. FINDINGS: The lungs are well-expanded. There is increased density in the left lower lobe consistent with atelectasis or pneumonia. Hazy increased density in the right infrahilar region is slightly less conspicuous today. The cardiac silhouette is normal in size. There is calcification in the wall of the aortic arch. The pulmonary vascularity is not engorged. There is known 50% wedge compression of the body of T8 and of T12. The T12 compression has been treated with kyphoplasty. IMPRESSION: Left lower lobe atelectasis or pneumonia. Probable atelectasis or early infiltrate in the right lower lobe. Trace bilateral pleural effusions. Thoracic aortic atherosclerosis. Electronically Signed   By: David  [place] M.D.   On: 09/14/2016 07:46   Mr Abdomen W Wo Contrast  Result Date: 09/13/2016 CLINICAL DATA:  Acute generalized abdominal pain and vomiting. CT suggested possible pancreatic lesion. EXAM: MRI ABDOMEN WITHOUT AND WITH CONTRAST TECHNIQUE: Multiplanar multisequence MR imaging of the abdomen was performed both before and after the administration of intravenous contrast.  CONTRAST:  40mL MULTIHANCE GADOBENATE DIMEGLUMINE 529 MG/ML IV SOLN COMPARISON:  CT abdomen and pelvis 09/13/2016, 04/05/2014, 11/20/2012 FINDINGS: Lower chest: Consolidation or atelectasis in both lung bases. Changes may represent pneumonia. Minimal pleural effusions. Hepatobiliary: No focal liver lesions. Gallbladder is unremarkable. Mild extrahepatic bile  duct dilatation is likely normal for patient's age. Pancreas: Evaluation of the pancreas is limited due to motion artifact. Pancreatic duct does not appear dilated. No enhancing mass lesions appreciated. No peripancreatic fluid collection or infiltration. Spleen:  Within normal limits in size and appearance. Adrenals/Urinary Tract: No masses identified. No evidence of hydronephrosis. Stomach/Bowel: Visualized portions within the abdomen are unremarkable. Vascular/Lymphatic: No pathologically enlarged lymph nodes identified. No abdominal aortic aneurysm demonstrated. Other: Examination is technically limited due to motion artifact. Significant lesions could be obscured to visualization. Musculoskeletal: Lumbar scoliosis with convex ED towards the right. Degenerative changes in the visualized lumbar spine. No expansile bone lesions appreciated. Central disc protrusion or osteophyte cause effacement of the thecal sac at L1-2. Moderate central canal stenosis at L2-3 and L3-4. IMPRESSION: Examination is technically limited by motion artifact. No focal pancreatic lesion or enhancement is demonstrated. No pancreatic ductal dilatation. Cannot confirm presence or absence of the lesion identified at CT. Electronically Signed   By: Lucienne Capers M.D.   On: 09/13/2016 22:38    Medications:   . ampicillin-sulbactam (UNASYN) IV  1.5 g Intravenous Q6H  . artificial tears  1 application Left Eye Daily  . aspirin  81 mg Oral Daily  . enoxaparin (LOVENOX) injection  40 mg Subcutaneous Q24H  . folic acid  1 mg Oral Daily  . hydroxychloroquine  200 mg Oral QHS  .  levothyroxine  50 mcg Oral QAC breakfast  . morphine  15 mg Oral Q12H  . pantoprazole  40 mg Oral QAC breakfast  . potassium chloride  40 mEq Oral Q2H  . predniSONE  5 mg Oral Q breakfast  . sertraline  75 mg Oral Daily  . cyanocobalamin  100 mcg Oral Daily   Continuous Infusions: . dextrose 5 % with kcl 50 mL/hr at 09/15/16 0837  . dextrose 50 mL/hr at 09/15/16 4098    Medical decision making is of high complexity and this patient is at high risk of deterioration, therefore this is a level 3 visit.  (> 4 problem points, 3 data points, high risk)   Problems/DDx Points   Self limited or minor (max 2)       1   Established problem, stable       1   Established problem, worsening       2   New problem, no additional W/U planned (max 1)       3   New problem, additional W/U planned        4    Data Reviewed Points   Review/order clinical lab tests       1   Review/order x-rays       1   Review/order tests (Echo, EKG, PFTs, etc)       1   Discussion of test results w/ performing MD       1   Independent review of image, tracing or specimen       2   Decision to obtain old records       1   Review and summation of old records       2    Level of risk Presenting prob Diagnostics Management   Minimal 1 self limited/minor Labs CXR EKG/EEG U/A U/S Rest Gargles Bandages Dressings   Low 2 or more self limited/minor 1 stable chronic Acute uncomplicated illness Tests (PFTS) Non-CV imaging Arterial labs Biopsies of skin OTC drugs Minor surgery-no risk PT OT IVF without additives    Moderate  1 or more chronic illnesses w/ mild exac, progression or S/E from tx 2 or more stable chronic illnesses Undiagnosed new problem w/ uncertain prognosis Acute complicated injury  Stress tests Endoscopies with no risk factors Deep needle or incisional bx CV imaging without risk LP Thoracentesis Paracentesis Minor surgery w/ risks Elective major surgery w/ no risk (open, percutaneous or  endoscopic) Prescription drugs Therapeutic nucl med IVF with additives Closed tx of fracture/dislocation    High Severe exac of chronic illness Acute or chronic illness/injury may pose a threat to life or bodily function (ARF) Change in neuro status    CV imaging w/ contrast and risk Cardio electophysiologic tests Endoscopies w/ risk Discography Elective major surgery Emergency major surgery Parenteral controlled substances Drug therapy req monitoring for toxicity DNR/de-escalation of care    MDM Prob points Data points Risk   Straightforward    <1    <1    Min   Low complexity    2    2    Low   Moderate    3    3    Mod   High Complexity    4 or more    4 or more    High      LOS: 2 days   Tonye Tancredi  [facility] Pager [phone]. If unable to reach me by pager, please call my cell phone at [phone].  *Please refer to [email or web address], password [email or web address] to get updated schedule on who will round on this patient, as hospitalists switch teams weekly. If 7PM-7AM, please contact night-coverage at [email or web address], password [email or web address] for any overnight needs.  09/15/2016, 2:40 PM

## 2016-09-15 NOTE — NC FL2 (Signed)
[place] LEVEL OF CARE SCREENING TOOL     IDENTIFICATION  Patient Name: [name] Birthdate: [birth year] Sex: female Admission Date (Current Location): 09/13/2016  [facility] and [place] Number:  Herbalist and Address:  [facility], [place] [address] [place], [place] [address]      Provider Number: [id number]  Attending Physician Name and Address:  Venetia Maxon Rama, MD  Relative Name and Phone Number:       Current Level of Care: Hospital Recommended Level of Care: [place] Prior Approval Number:    Date Approved/Denied:   PASRR Number: [id number]  Discharge Plan: SNF    Current Diagnoses: Patient Active Problem List   Diagnosis Date Noted  . Frailty   . Palliative care encounter   . Goals of care, counseling/discussion   . HCAP (healthcare-associated pneumonia) 09/13/2016  . Pancreatic lesion   . Closed wedge compression fracture of T8 vertebra ([place]) 08/31/2016  . T8 vertebral fracture ([place]) 08/31/2016  . Fall   . Shingles 06/15/2016  . Aspiration into airway   . Pneumonia 10/13/2015  . Aspiration pneumonia ([place]) 10/13/2015  . Severe sepsis ([place])   . Encephalopathy   . Thyroid activity decreased   . Long term current use of systemic steroids   . Chronic obstructive pulmonary disease ([place])   . Syncope and collapse 11/20/2012  . Hypotension 11/20/2012  . Nausea and vomiting 11/20/2012  . Urinary retention 11/20/2012  . Dysuria R/O UTI 11/20/2012  . Anemia 11/20/2012  . GERD (gastroesophageal reflux disease) 11/20/2012  . Dehydration 01/06/2012  . Gastroenteritis 01/06/2012  . Gait instability 01/06/2012  . Arthritis   . Mitral valve prolapse   . Hiatal hernia   . Diverticulitis   . COPD (chronic obstructive pulmonary disease) ([place])   . Thyroid disease   . Atrial fibrillation ([place])   . Orthostatic hypotension   . Vertigo   . Osteoporosis   . Diverticulosis   . Syncope 08/12/2011  .  CARCINOMA, BASAL CELL 06/11/2010  . Rheumatoid arthritis ([place]) 05/22/2009  . Hypothyroidism 05/20/2009  . Abdominal pain 05/17/2009    Orientation RESPIRATION BLADDER Height & Weight     Self, Situation, Place, Time  Normal Incontinent Weight: 49.9 kg (110 lb 0.2 oz) Height:  5\' 6"  (167.6 cm)  BEHAVIORAL SYMPTOMS/MOOD NEUROLOGICAL BOWEL NUTRITION STATUS   (NONE)  (NONE) Incontinent Diet (DYS 3)  AMBULATORY STATUS COMMUNICATION OF NEEDS Skin   Extensive Assist Verbally Normal                       Personal Care Assistance Level of Assistance  Bathing, Feeding, Dressing Bathing Assistance: Limited assistance Feeding assistance: Independent Dressing Assistance: Limited assistance     Functional Limitations Info  Sight, Hearing, Speech Sight Info: Adequate Hearing Info: Adequate Speech Info: Adequate    SPECIAL CARE FACTORS FREQUENCY  PT (By licensed PT), OT (By licensed OT)     PT Frequency: 5/week OT Frequency: 5/week            Contractures Contractures Info: Not present    Additional Factors Info  Code Status, Allergies, Psychotropic Code Status Info: DNR Allergies Info: Lactose intolerance Psychotropic Info: Zoloft         Current Medications (09/15/2016):  This is the current hospital active medication list Current Facility-Administered Medications  Medication Dose Route Frequency Provider Last Rate Last Dose  . acetaminophen (TYLENOL) tablet 650 mg  650 mg Oral Q6H  PRN Rondel Jumbo, PA-C       Or  . acetaminophen (TYLENOL) suppository 650 mg  650 mg Rectal Q6H PRN Rondel Jumbo, PA-C   650 mg at 09/13/16 1143  . ampicillin-sulbactam (UNASYN) 1.5 g in sodium chloride 0.9 % 50 mL IVPB  1.5 g Intravenous Q6H Kimberly B Hammons, RPH   1.5 g at 09/15/16 0525  . artificial tears (LACRILUBE) ophthalmic ointment 1 application  1 application Left Eye Daily Rondel Jumbo, PA-C   1 application at [id number] [phone]  . aspirin chewable tablet 81 mg  81 mg Oral  Daily Rondel Jumbo, PA-C   81 mg at 09/15/16 [id number]  . bisacodyl (DULCOLAX) suppository 10 mg  10 mg Rectal Daily PRN Rondel Jumbo, PA-C      . dextrose 5 % 1,000 mL with potassium chloride 40 mEq infusion   Intravenous Continuous Lavina Hamman, MD 50 mL/hr at 09/15/16 0837    . dextrose 5 % solution   Intravenous Continuous Lavina Hamman, MD 50 mL/hr at 09/15/16 [phone]    . enoxaparin (LOVENOX) injection 40 mg  40 mg Subcutaneous Q24H Waldemar Dickens, MD   40 mg at 09/15/16 0836  . folic acid (FOLVITE) tablet 1 mg  1 mg Oral Daily Rondel Jumbo, PA-C   1 mg at 09/15/16 [id number]  . hydroxychloroquine (PLAQUENIL) tablet 200 mg  200 mg Oral QHS Rondel Jumbo, PA-C   200 mg at 09/14/16 2127  . ipratropium-albuterol (DUONEB) 0.5-2.5 (3) MG/3ML nebulizer solution 3 mL  3 mL Nebulization Q6H PRN Rondel Jumbo, PA-C      . levothyroxine (SYNTHROID, LEVOTHROID) tablet 50 mcg  50 mcg Oral QAC breakfast Rondel Jumbo, PA-C   50 mcg at 09/15/16 [id number]  . meclizine (ANTIVERT) tablet 12.5 mg  12.5 mg Oral TID PRN Rondel Jumbo, PA-C      . morphine (MS CONTIN) 12 hr tablet 15 mg  15 mg Oral Q12H Marianne L York, PA-C   15 mg at 09/15/16 0836  . ondansetron (ZOFRAN) tablet 4 mg  4 mg Oral Q6H PRN Rondel Jumbo, PA-C       Or  . ondansetron [facility]) injection 4 mg  4 mg Intravenous Q6H PRN Rondel Jumbo, PA-C   4 mg at 09/14/16 2148  . oxyCODONE (Oxy IR/ROXICODONE) immediate release tablet 5 mg  5 mg Oral Q4H PRN Rondel Jumbo, PA-C      . pantoprazole (PROTONIX) EC tablet 40 mg  40 mg Oral QAC breakfast Rondel Jumbo, PA-C   40 mg at 09/15/16 0835  . potassium chloride SA (K-DUR,KLOR-CON) CR tablet 40 mEq  40 mEq Oral Once Venetia Maxon Rama, MD      . predniSONE (DELTASONE) tablet 5 mg  5 mg Oral Q breakfast Rondel Jumbo, PA-C   5 mg at 09/15/16 [id number]  . senna-docusate (Senokot-S) tablet 1 tablet  1 tablet Oral QHS PRN Rondel Jumbo, PA-C      . sertraline (ZOLOFT) tablet 75 mg  75 mg Oral Daily Melton Alar, PA-C   75 mg at 09/15/16 0835  . traZODone (DESYREL) tablet 25 mg  25 mg Oral QHS PRN Rondel Jumbo, PA-C   25 mg at 09/14/16 2126  . vitamin B-12 (CYANOCOBALAMIN) tablet 100 mcg  100 mcg Oral Daily Rondel Jumbo, PA-C   100 mcg at 09/15/16 [id number]     Discharge Medications: Please see discharge summary  for a list of discharge medications.  Relevant Imaging Results:  Relevant Lab Results:   Additional Information SSN:  [id number]  Rigoberto Noel, LCSW

## 2016-09-15 NOTE — Progress Notes (Signed)
Daily Progress Note   Patient Name: [name]       Date: 09/15/2016 DOB: 12-30-1923  Age: 83 y.o. MRN#: [id number] Attending Physician: Venetia Maxon Rama, MD Primary Care Physician: Willodean Rosenthal, MD Admit Date: 09/13/2016  Reason for Consultation/Follow-up: Establishing goals of care  Subjective: Talked with patient, daughter and nephew at bedside.  Presented MOST form.  Patient is a DNR.  She states she would like to return to the hospital if it was necessary (for example if she had pneumonia).  However she would not want a feeding tube.  Tried to elicit patient's values - quality of life vs quantity of life, but was unable to get a clear direction from the patient.  Patient Profile/HPI: 14 yof with RA, MVP, frequent falls, frailty improving after being admitted with vomiting and aspiration pneumonia.   HCPOA [name]) is now familiar with the MOST form.     Length of Stay: 2  Current Medications: Scheduled Meds:  . ampicillin-sulbactam (UNASYN) IV  1.5 g Intravenous Q6H  . artificial tears  1 application Left Eye Daily  . aspirin  81 mg Oral Daily  . enoxaparin (LOVENOX) injection  40 mg Subcutaneous Q24H  . folic acid  1 mg Oral Daily  . hydroxychloroquine  200 mg Oral QHS  . levothyroxine  50 mcg Oral QAC breakfast  . morphine  15 mg Oral Q12H  . pantoprazole  40 mg Oral QAC breakfast  . potassium chloride  40 mEq Oral Q2H  . predniSONE  5 mg Oral Q breakfast  . sertraline  75 mg Oral Daily  . cyanocobalamin  100 mcg Oral Daily    Continuous Infusions: . dextrose 5 % with kcl 50 mL/hr at 09/15/16 0837  . dextrose 50 mL/hr at 09/15/16 0837    PRN Meds: acetaminophen **OR** acetaminophen, bisacodyl, ipratropium-albuterol, meclizine, ondansetron **OR** ondansetron  (ZOFRAN) IV, oxyCODONE, senna-docusate, traZODone  Physical Exam  Constitutional: She is oriented to person, place, and time.  Elderly, frail, smiling and alert  HENT:  Head: Normocephalic and atraumatic.  Eyes: EOM are normal. No scleral icterus.  Neck: Neck supple.  Neurological: She is alert and oriented to person, place, and time.  Psychiatric: She has a normal mood and affect. Her behavior is normal. Judgment normal.  Vital Signs: BP (!) 137/49 (BP Location: Left Arm)   Pulse 79   Temp 97.9 F (36.6 C) (Oral)   Resp (!) 22   Ht 5\' 6"  (1.676 m)   Wt 49.9 kg (110 lb 0.2 oz)   SpO2 100%   BMI 17.76 kg/m  SpO2: SpO2: 100 % O2 Device: O2 Device: Nasal Cannula O2 Flow Rate: O2 Flow Rate (L/min): 3 L/min  Intake/output summary:  Intake/Output Summary (Last 24 hours) at 09/15/16 1317 Last data filed at 09/15/16 0900  Gross per 24 hour  Intake             2430 ml  Output                0 ml  Net             2430 ml   LBM: Last BM Date: 09/14/16 Baseline Weight: Weight: 49.4 kg (109 lb) Most recent weight: Weight: 49.9 kg (110 lb 0.2 oz)       Palliative Assessment/Data:    Flowsheet Rows   Flowsheet Row Most Recent Value  Intake Tab  Referral Department  Hospitalist  Unit at Time of Referral  Cardiac/Telemetry Unit  Palliative Care Primary Diagnosis  Sepsis/Infectious Disease  Date Notified  09/13/16  Palliative Care Type  New Palliative care  Reason for referral  Clarify Goals of Care  Date of Admission  09/13/16  Date first seen by Palliative Care  09/14/16  # of days Palliative referral response time  1 Day(s)  # of days IP prior to Palliative referral  0  Clinical Assessment  Palliative Performance Scale Score  40%  Pain Max last 24 hours  3  Pain Min Last 24 hours  0  Nausea Max Last 24 Hours  10  Nausea Min Last 24 Hours  0  Psychosocial & Spiritual Assessment  Palliative Care Outcomes  Patient/Family meeting held?  Yes  Who was at the  meeting?  Patient, daughter on phone  Palliative Care Outcomes  Clarified goals of care      Patient Active Problem List   Diagnosis Date Noted  . Frailty   . Palliative care encounter   . Goals of care, counseling/discussion   . HCAP (healthcare-associated pneumonia) 09/13/2016  . Pancreatic lesion   . Closed wedge compression fracture of T8 vertebra ([place]) 08/31/2016  . T8 vertebral fracture ([place]) 08/31/2016  . Fall   . Shingles 06/15/2016  . Aspiration into airway   . Pneumonia 10/13/2015  . Aspiration pneumonia ([place]) 10/13/2015  . Severe sepsis ([place])   . Encephalopathy   . Thyroid activity decreased   . Long term current use of systemic steroids   . Chronic obstructive pulmonary disease ([place])   . Syncope and collapse 11/20/2012  . Hypotension 11/20/2012  . Nausea and vomiting 11/20/2012  . Urinary retention 11/20/2012  . Dysuria R/O UTI 11/20/2012  . Anemia 11/20/2012  . GERD (gastroesophageal reflux disease) 11/20/2012  . Dehydration 01/06/2012  . Gastroenteritis 01/06/2012  . Gait instability 01/06/2012  . Arthritis   . Mitral valve prolapse   . Hiatal hernia   . Diverticulitis   . COPD (chronic obstructive pulmonary disease) ([place])   . Thyroid disease   . Atrial fibrillation ([place])   . Orthostatic hypotension   . Vertigo   . Osteoporosis   . Diverticulosis   . Syncope 08/12/2011  . CARCINOMA, BASAL CELL 06/11/2010  . Rheumatoid arthritis ([place]) 05/22/2009  . Hypothyroidism 05/20/2009  . Abdominal pain  05/17/2009    Palliative Care Plan    Recommendations/Plan:  D/C to SNF [facility]) when medically appropriate.    Continue chronic pain medication (MS Contin)  Continue Zoloft for depression  [employer] back brace for recent compression fracture  Goals of Care and Additional Recommendations:  Limitations on Scope of Treatment: Full Scope Treatment  Code Status:  DNR  Prognosis:   < 12 months   Discharge Planning:  [place]  for rehab with Palliative care service follow-up  Care plan was discussed with daughter [name] at bedside.  Thank you for allowing the Palliative Medicine Team to assist in the care of this patient.  Total time spent:  25 min.     Greater than 50%  of this time was spent counseling and coordinating care related to the above assessment and plan.  Imogene Burn, PA-C Palliative Medicine  Please contact Palliative MedicineTeam phone at [phone] for questions and concerns between 7 am - 7 pm.   Please see AMION for individual provider pager numbers.

## 2016-09-16 DIAGNOSIS — I341 Nonrheumatic mitral (valve) prolapse: Secondary | ICD-10-CM

## 2016-09-16 DIAGNOSIS — R197 Diarrhea, unspecified: Secondary | ICD-10-CM

## 2016-09-16 DIAGNOSIS — R112 Nausea with vomiting, unspecified: Secondary | ICD-10-CM

## 2016-09-16 DIAGNOSIS — M069 Rheumatoid arthritis, unspecified: Secondary | ICD-10-CM

## 2016-09-16 LAB — GLUCOSE, CAPILLARY
GLUCOSE-CAPILLARY: 98 mg/dL (ref 65–99)
GLUCOSE-CAPILLARY: 155 mg/dL — AB (ref 65–99)
Glucose-Capillary: 141 mg/dL — ABNORMAL HIGH (ref 65–99)

## 2016-09-16 LAB — BASIC METABOLIC PANEL
ANION GAP: 8 (ref 5–15)
CHLORIDE: 99 mmol/L — AB (ref 101–111)
CO2: 27 mmol/L (ref 22–32)
Calcium: 9 mg/dL (ref 8.9–10.3)
Creatinine, Ser: 0.63 mg/dL (ref 0.44–1.00)
Glucose, Bld: 101 mg/dL — ABNORMAL HIGH (ref 65–99)
POTASSIUM: 4.4 mmol/L (ref 3.5–5.1)
SODIUM: 134 mmol/L — AB (ref 135–145)

## 2016-09-16 MED ORDER — OXYCODONE HCL 5 MG PO TABS: 5.0000 mg | ORAL_TABLET | ORAL | 0 refills | Status: AC | PRN

## 2016-09-16 MED ORDER — TRAMADOL HCL 50 MG PO TABS: 50.0000 mg | ORAL_TABLET | Freq: Four times a day (QID) | ORAL | 0 refills | Status: AC | PRN

## 2016-09-16 MED ORDER — MORPHINE SULFATE ER 15 MG PO TBCR: 15.0000 mg | EXTENDED_RELEASE_TABLET | Freq: Every day | ORAL | 0 refills | Status: AC

## 2016-09-16 MED ORDER — AMOXICILLIN-POT CLAVULANATE 875-125 MG PO TABS: 1.0000 | ORAL_TABLET | Freq: Two times a day (BID) | ORAL | 0 refills | Status: AC

## 2016-09-16 NOTE — Clinical Social Work Note (Signed)
Patient medically stable for discharge and will return to [facility] skilled facility. CSW talked with daughter and confirmed return to facility and ambulance transport. Facility admissions director, Candance notified and discharge clinicals transmitted to facility.  Maysoon Lozada Givens, MSW, LCSW Licensed Clinical Social Worker [place] [phone]

## 2016-09-16 NOTE — Discharge Summary (Signed)
Physician Discharge Summary  [name] JYN:[id number] DOB: June 05, 1924 DOA: 09/13/2016  PCP: Willodean Rosenthal, MD  Admit date: 09/13/2016 Discharge date: 09/16/2016  Admitted From: SNF Discharge disposition: SNF [facility])   Recommendations for Outpatient Follow-Up:   1. Recommend repeat BMET in 1 week to ensure stability of electrolytes. 2. Please F/U final blood culture results. 3. Continue [employer] back brace for recent compression fracture.   Discharge Diagnosis:   Principal Problem:    Aspiration pneumonia ([place]) Active Problems:    Hypothyroidism    Rheumatoid arthritis ([place])    Abdominal pain    Mitral valve prolapse    Hiatal hernia    COPD (chronic obstructive pulmonary disease) (HCC)    Atrial fibrillation (HCC)    Hypotension    Nausea and vomiting    GERD (gastroesophageal reflux disease)    Sepsis ([place])    Long term current use of systemic steroids    Chronic obstructive pulmonary disease (HCC)    Closed wedge compression fracture of T8 vertebra (HCC)    HCAP (healthcare-associated pneumonia)    Frailty    Palliative care encounter    Goals of care, counseling/discussion    Hypokalemia    Hypoglycemia    Discharge Condition: Improved.  Diet recommendation: Low sodium, heart healthy.  Dysphagia III.  History of Present Illness:   [name] is an 83 y.o. female with PMH of Rheumatoid arthritis, hypothyroidism, COPD, A. fib, GERD; admitted on 09/13/2016, with complaint of nausea vomiting and diarrhea, was found to have healthcare associated aspiration pneumonia.  Hospital Course by Problem:   Principal Problem:   Sepsis secondary to Aspiration pneumonia (HCC)/HCAP in the setting of nausea and vomiting Patient met sepsis criteria on admission and was initially treated with empiric vancomycin and Zosyn. She has a known history of dysphasia and a review of her chest x-ray showed that the infiltrate appears at the termination of  the right bronchus which is also consistent with aspiration. Continue dysphagia 3 diet per speech therapy recommendations. Change Unasyn to Augmentin at discharge for an additional 5 days of treatment.   Active Problems:   Hypokalemia Replaced. Likely from GI losses.    Hypoglycemia Managed with a dextrose infusion. Resolved.    Hypothyroidism Continue Synthroid.    Rheumatoid arthritis ([place]) long term current use of systemic steroids Continue pain management with MS Contin and steroids for inflammation. Continue Plaquenil & Methotrexate per outpatient regimen.    Abdominal pain CT abdomen and pelvis low-density seen at the pancreatic head, which could represent pancreatitis versus pancreatic necrosis, but normal LFTs and lipase.Resolved at this point. Would not workup any further. Diet successfully advanced.    COPD (chronic obstructive pulmonary disease) ([place]) Compensated.    Paroxysmal Atrial fibrillation ([place]) Not currently on blood thinners. Rate controlled maintaining NSR. Continue aspirin.     GERD (gastroesophageal reflux disease)/hiatal hernia Continue Protonix.    Closed wedge compression fracture of T8 vertebra ([place]) Has been evaluated for kyphoplasty but is not a candidate. Continue symptomatic treatment with pain medications. Patient will follow-up with Dr. Sherwood Gambler in 1 month as OP. Back brace ordered.    Frailty/Palliative care encounter/Goals of care, counseling/discussion Palliative care team met with the patient and her family today. Desires the full scope of treatment but is a DO NOT RESUSCITATE otherwise. Would not want a feeding tube.  Medical Consultants:    Palliative Care   Discharge Exam:   Vitals:   09/16/16 0805 09/16/16 1024  BP:  [name])  108/44  Pulse:    Resp:  19  Temp: 98.3 F (36.8 C) 98.4 F (36.9 C)   Vitals:   09/16/16 0500 09/16/16 0520 09/16/16 0805 09/16/16 1024  BP:  (!) 147/57  (!) 108/44  Pulse:  65    Resp:  18   19  Temp:  98.2 F (36.8 C) 98.3 F (36.8 C) 98.4 F (36.9 C)  TempSrc:  Oral Oral Oral  SpO2:  98%    Weight: 50.7 kg (111 lb 11.2 oz)     Height:        General exam: Appears comfortable, sitting up in chair.  Respiratory system: Diminished to auscultation. Respiratory effort normal. Cardiovascular system: S1 & S2 heard, RRR. No JVD,  rubs, gallops or clicks. No murmurs. Gastrointestinal system: Abdomen is nondistended, soft and nontender. No organomegaly or masses felt. Normal bowel sounds heard. Central nervous system: Alert and pleasantly confused. No focal neurological deficits. Extremities: No clubbing,  or cyanosis. No edema. Extensive lower extremity ecchymosis. Skin: No rashes, lesions or ulcers. Bruising as noted in the LEs. Psychiatry: Judgement and insight appear mildly decreased. Mood & affect mildly anxious.    The results of significant diagnostics from this hospitalization (including imaging, microbiology, ancillary and laboratory) are listed below for reference.     Procedures and Diagnostic Studies:   Dg Chest 2 View  Result Date: 09/14/2016 CLINICAL DATA:  Sepsis, weakness. History of COPD, atrial fibrillation, hypotension, healthcare associated pneumonia. EXAM: CHEST  2 VIEW COMPARISON:  Portable chest x-ray of September 13, 2016 and thoracic spine series of September 01, 2016. FINDINGS: The lungs are well-expanded. There is increased density in the left lower lobe consistent with atelectasis or pneumonia. Hazy increased density in the right infrahilar region is slightly less conspicuous today. The cardiac silhouette is normal in size. There is calcification in the wall of the aortic arch. The pulmonary vascularity is not engorged. There is known 50% wedge compression of the body of T8 and of T12. The T12 compression has been treated with kyphoplasty. IMPRESSION: Left lower lobe atelectasis or pneumonia. Probable atelectasis or early infiltrate in the right lower lobe.  Trace bilateral pleural effusions. Thoracic aortic atherosclerosis. Electronically Signed   By: David  Martinique M.D.   On: 09/14/2016 07:46   Mr Abdomen W Wo Contrast  Result Date: 09/13/2016 CLINICAL DATA:  Acute generalized abdominal pain and vomiting. CT suggested possible pancreatic lesion. EXAM: MRI ABDOMEN WITHOUT AND WITH CONTRAST TECHNIQUE: Multiplanar multisequence MR imaging of the abdomen was performed both before and after the administration of intravenous contrast. CONTRAST:  15m MULTIHANCE GADOBENATE DIMEGLUMINE 529 MG/ML IV SOLN COMPARISON:  CT abdomen and pelvis 09/13/2016, 04/05/2014, 11/20/2012 FINDINGS: Lower chest: Consolidation or atelectasis in both lung bases. Changes may represent pneumonia. Minimal pleural effusions. Hepatobiliary: No focal liver lesions. Gallbladder is unremarkable. Mild extrahepatic bile duct dilatation is likely normal for patient's age. Pancreas: Evaluation of the pancreas is limited due to motion artifact. Pancreatic duct does not appear dilated. No enhancing mass lesions appreciated. No peripancreatic fluid collection or infiltration. Spleen:  Within normal limits in size and appearance. Adrenals/Urinary Tract: No masses identified. No evidence of hydronephrosis. Stomach/Bowel: Visualized portions within the abdomen are unremarkable. Vascular/Lymphatic: No pathologically enlarged lymph nodes identified. No abdominal aortic aneurysm demonstrated. Other: Examination is technically limited due to motion artifact. Significant lesions could be obscured to visualization. Musculoskeletal: Lumbar scoliosis with convex ED towards the right. Degenerative changes in the visualized lumbar spine. No expansile bone lesions appreciated. Central disc  protrusion or osteophyte cause effacement of the thecal sac at L1-2. Moderate central canal stenosis at L2-3 and L3-4. IMPRESSION: Examination is technically limited by motion artifact. No focal pancreatic lesion or enhancement is  demonstrated. No pancreatic ductal dilatation. Cannot confirm presence or absence of the lesion identified at CT. Electronically Signed   By: Lucienne Capers M.D.   On: 09/13/2016 22:38   Ct Abdomen Pelvis W Contrast  Result Date: 09/13/2016 CLINICAL DATA:  Acute generalized abdominal pain, vomiting. EXAM: CT ABDOMEN AND PELVIS WITH CONTRAST TECHNIQUE: Multidetector CT imaging of the abdomen and pelvis was performed using the standard protocol following bolus administration of intravenous contrast. CONTRAST:  36m ISOVUE-300 IOPAMIDOL (ISOVUE-300) INJECTION 61% COMPARISON:  CT scan of April 05, 2014. FINDINGS: Lower chest: Bilateral posterior basilar subsegmental atelectasis or infiltrates is noted. Hepatobiliary: No focal liver abnormality is seen. No gallstones, gallbladder wall thickening, or biliary dilatation. Pancreas: There is noted low density involving the pancreatic head concerning for possible pancreatitis or pancreatic necrosis. Spleen: Normal in size without focal abnormality. Adrenals/Urinary Tract: Adrenal glands are unremarkable. Kidneys are normal, without renal calculi, focal lesion, or hydronephrosis. Bladder is unremarkable. Stomach/Bowel: Stomach is within normal limits. Appendix appears normal. No evidence of bowel wall thickening, distention, or inflammatory changes. Vascular/Lymphatic: Aortic atherosclerosis. No enlarged abdominal or pelvic lymph nodes. Reproductive: No adnexal abnormality is noted. Uterus is not clearly identified. Other: No abdominal wall hernia or abnormality. No abdominopelvic ascites. Musculoskeletal: Severe multilevel degenerative disc disease is noted in the lower lumbar spine. Status post kyphoplasty of T12 vertebral body. IMPRESSION: Bilateral posterior basilar subsegmental atelectasis or pneumonia. Aortic atherosclerosis. Low density is seen involving the pancreatic head, which may represent pancreatitis or possibly pancreatic necrosis. Correlation with clinical  laboratory findings is recommended. Electronically Signed   By: JMarijo Conception M.D.   On: 09/13/2016 07:45   Dg Chest Port 1 View  Result Date: 09/13/2016 CLINICAL DATA:  Sepsis.  Shortness of breath. EXAM: PORTABLE CHEST 1 VIEW COMPARISON:  08/30/2016 FINDINGS: Borderline heart size with normal pulmonary vascularity. Emphysematous changes in the lungs. Scattered fibrosis. Developing infiltration in the right inferior perihilar region may indicate pneumonia in the appropriate clinical setting. No blunting of costophrenic angles. No pneumothorax. Calcification and torsion of the aorta. Previous kyphoplasty changes at T12. IMPRESSION: Emphysematous changes in the lungs. Developing right infrahilar infiltration suggesting pneumonia. Electronically Signed   By: WLucienne CapersM.D.   On: 09/13/2016 05:25     Labs:   Basic Metabolic Panel:  Recent Labs Lab 09/13/16 1632 09/14/16 0539 09/14/16 1809 09/15/16 0507 09/16/16 0501  NA 138 138 136 137 134*  K 2.9* 2.5* 3.1* 3.0* 4.4  CL 110 106 103 103 99*  CO2 21* '24 26 26 27  '$ GLUCOSE 98 72 123* 119* 101*  BUN 12 10 5* <5* <5*  CREATININE 0.70 0.68 0.61 0.62 0.63  CALCIUM 7.3* 8.1* 8.4* 8.4* 9.0  MG  --   --   --  2.0  --    GFR Estimated Creatinine Clearance: 37.4 mL/min (by C-G formula based on SCr of 0.63 mg/dL). Liver Function Tests:  Recent Labs Lab 09/13/16 0445 09/13/16 1632 09/14/16 0539  AST 37 24 27  ALT '20 17 20  '$ ALKPHOS 110 78 90  BILITOT 1.2 0.5 0.2*  PROT 6.3* 4.7* 5.2*  ALBUMIN 3.6 2.4* 2.6*    Recent Labs Lab 09/13/16 0445  LIPASE 12   No results for input(s): AMMONIA in the last 168 hours. Coagulation profile  Recent Labs Lab 09/13/16 1632  INR 1.14    CBC:  Recent Labs Lab 09/13/16 0445 09/13/16 1632 09/14/16 0539 09/15/16 0507  WBC 7.2 9.8 10.9* 9.8  NEUTROABS 6.4 8.1*  --   --   HGB 13.7 10.5* 11.2* 10.7*  HCT 42.5 31.9* 34.9* 32.6*  MCV 103.2* 101.6* 103.9* 102.8*  PLT 427* 312 320  312   Cardiac Enzymes: No results for input(s): CKTOTAL, CKMB, CKMBINDEX, TROPONINI in the last 168 hours. BNP: Invalid input(s): POCBNP CBG:  Recent Labs Lab 09/15/16 0745 09/15/16 1207 09/15/16 1615 09/15/16 2145 09/16/16 0802  GLUCAP 112* 160* 141* 103* 98   D-Dimer No results for input(s): DDIMER in the last 72 hours. Hgb A1c No results for input(s): HGBA1C in the last 72 hours. Lipid Profile No results for input(s): CHOL, HDL, LDLCALC, TRIG, CHOLHDL, LDLDIRECT in the last 72 hours. Thyroid function studies No results for input(s): TSH, T4TOTAL, T3FREE, THYROIDAB in the last 72 hours.  Invalid input(s): FREET3 Anemia work up No results for input(s): VITAMINB12, FOLATE, FERRITIN, TIBC, IRON, RETICCTPCT in the last 72 hours. Microbiology Recent Results (from the past 240 hour(s))  Blood Culture (routine x 2)     Status: None (Preliminary result)   Collection Time: 09/13/16  4:45 AM  Result Value Ref Range Status   Specimen Description BLOOD RIGHT ANTECUBITAL  Final   Special Requests BOTTLES DRAWN AEROBIC AND ANAEROBIC 5CC EA  Final   Culture NO GROWTH 2 DAYS  Final   Report Status PENDING  Incomplete  Blood Culture (routine x 2)     Status: None (Preliminary result)   Collection Time: 09/13/16  5:00 AM  Result Value Ref Range Status   Specimen Description BLOOD RIGHT ANTECUBITAL  Final   Special Requests IN PEDIATRIC BOTTLE 3CC  Final   Culture NO GROWTH 2 DAYS  Final   Report Status PENDING  Incomplete  C difficile quick scan w PCR reflex     Status: None   Collection Time: 09/13/16  6:21 AM  Result Value Ref Range Status   C Diff antigen NEGATIVE NEGATIVE Final   C Diff toxin NEGATIVE NEGATIVE Final   C Diff interpretation No C. difficile detected.  Final  Urine culture     Status: None   Collection Time: 09/13/16  6:26 AM  Result Value Ref Range Status   Specimen Description URINE, RANDOM  Final   Special Requests NONE  Final   Culture NO GROWTH  Final    Report Status 09/14/2016 FINAL  Final  MRSA PCR Screening     Status: None   Collection Time: 09/14/16 12:58 AM  Result Value Ref Range Status   MRSA by PCR NEGATIVE NEGATIVE Final    Comment:        The GeneXpert MRSA Assay (FDA approved for NASAL specimens only), is one component of a comprehensive MRSA colonization surveillance program. It is not intended to diagnose MRSA infection nor to guide or monitor treatment for MRSA infections.      Discharge Instructions:   Discharge Instructions    Call MD for:  extreme fatigue    Complete by:  As directed    Call MD for:  persistant dizziness or light-headedness    Complete by:  As directed    Call MD for:  persistant nausea and vomiting    Complete by:  As directed    Call MD for:  severe uncontrolled pain    Complete by:  As directed  Call MD for:  temperature >100.4    Complete by:  As directed    Diet - low sodium heart healthy    Complete by:  As directed    Dysphagia III   Increase activity slowly    Complete by:  As directed    Walk with assistance    Complete by:  As directed    Walker     Complete by:  As directed      Allergies as of 09/16/2016      Reactions   Lactose Intolerance (gi) Other (See Comments)   Reaction:  GI upset       Medication List    TAKE these medications   acetaminophen 325 MG tablet Commonly known as:  TYLENOL Take 650 mg by mouth every 4 (four) hours as needed for mild pain, moderate pain, fever or headache.   amoxicillin-clavulanate 875-125 MG tablet Commonly known as:  AUGMENTIN Take 1 tablet by mouth 2 (two) times daily.   artificial tears Oint ophthalmic ointment Place 1 application into the left eye daily.   aspirin 81 MG chewable tablet Chew 81 mg by mouth daily.   bisacodyl 10 MG suppository Commonly known as:  DULCOLAX Place 10 mg rectally every three (3) days as needed for moderate constipation.   calcium carbonate 500 MG chewable tablet Commonly known as:   TUMS - dosed in mg elemental calcium Chew 1 tablet by mouth daily.   cholecalciferol 1000 units tablet Commonly known as:  VITAMIN D Take 2,000 Units by mouth daily.   cyanocobalamin 100 MCG tablet Take 100 mcg by mouth daily.   diclofenac sodium 1 % Gel Commonly known as:  VOLTAREN Apply 2 g topically every 6 (six) hours as needed (for pain).   folic acid 1 MG tablet Commonly known as:  FOLVITE Take 1 mg by mouth daily.   gabapentin 100 MG capsule Commonly known as:  NEURONTIN Take 1 capsule (100 mg total) by mouth 3 (three) times daily.   hydroxychloroquine 200 MG tablet Commonly known as:  PLAQUENIL Take 200 mg by mouth at bedtime.   hydroxypropyl methylcellulose / hypromellose 2.5 % ophthalmic solution Commonly known as:  ISOPTO TEARS / GONIOVISC Place 1 drop into the left eye 4 (four) times daily. What changed:  how to take this  when to take this   ISOPTO TEARS 0.5 % Soln Generic drug:  Hypromellose Place 1 drop into the left eye 4 (four) times daily.   levothyroxine 50 MCG tablet Commonly known as:  SYNTHROID, LEVOTHROID Take 50 mcg by mouth daily before breakfast.   loperamide 2 MG tablet Commonly known as:  IMODIUM A-D Take 2 mg by mouth 4 (four) times daily as needed for diarrhea or loose stools.   magnesium hydroxide 400 MG/5ML suspension Commonly known as:  MILK OF MAGNESIA Take 30 mLs by mouth daily as needed for mild constipation or moderate constipation.   magnesium oxide 400 (241.3 Mg) MG tablet Commonly known as:  MAG-OX Take 500 mg by mouth daily.   meclizine 12.5 MG tablet Commonly known as:  ANTIVERT Take 12.5 mg by mouth 3 (three) times daily as needed for dizziness.   morphine 15 MG 12 hr tablet Commonly known as:  MS CONTIN Take 1 tablet (15 mg total) by mouth daily.   oxyCODONE 5 MG immediate release tablet Commonly known as:  Oxy IR/ROXICODONE Take 1 tablet (5 mg total) by mouth every 4 (four) hours as needed for severe pain.  pantoprazole 40 MG tablet Commonly known as:  PROTONIX Take 40 mg by mouth daily before breakfast.   polyethylene glycol packet Commonly known as:  MIRALAX / GLYCOLAX Take 17 g by mouth daily as needed for mild constipation or moderate constipation.   predniSONE 5 MG tablet Commonly known as:  DELTASONE Take 5 mg by mouth daily with breakfast.   promethazine 25 MG tablet Commonly known as:  PHENERGAN Take 25 mg by mouth every 4 (four) hours as needed for nausea or vomiting.   senna-docusate 8.6-50 MG tablet Commonly known as:  Senokot-S Take 2 tablets by mouth at bedtime. Pt is able to take an additional tablet during the day if needed.   sertraline 50 MG tablet Commonly known as:  ZOLOFT Take 75 mg by mouth daily.   traMADol 50 MG tablet Commonly known as:  ULTRAM Take 1 tablet (50 mg total) by mouth every 6 (six) hours as needed for moderate pain.      Follow-up Information    POWELL, JERRY, MD. Schedule an appointment as soon as possible for a visit in 1 week(s).   Specialty:  Internal Medicine Why:  Hospital follow up. Contact information: [address] [place] [address] [phone]            Time coordinating discharge: > 35 minutes.  Signed:  Bevin Mayall  Pager [phone] [facility] 09/16/2016, 10:34 AM

## 2016-09-16 NOTE — Progress Notes (Signed)
Physical Therapy Treatment Patient Details Name: [name] MRN: [id number] DOB: [birth year] Today's Date: 09/16/2016    History of Present Illness 83 y.o. female with medical history significant for Afib, GERD, hypothyroidism, RA on plaquenil and prednisone, COPD, recent admission to the hospital for T8 vertebral compression fracture 12/25, brought to the ED with acute onset of nausea, vomiting, diarrhea, as well as chills without subjective fevers.    PT Comments    Patient progressing slowly towards PT goals. Tolerated sitting EOB without support performing BLE exercises. Continues to be fearful of falling and anxious about mobility. Not able to stand without Max A of 2 due to anxiety and posterior lean through hips. SPT to chair with total A of 2. Recommend return to SNF. Will follow.   Follow Up Recommendations  SNF     Equipment Recommendations  None recommended by PT    Recommendations for Other Services       Precautions / Restrictions Precautions Precautions: Fall Required Braces or Orthoses: Spinal Brace Spinal Brace: Lumbar corset Restrictions Weight Bearing Restrictions: No    Mobility  Bed Mobility Overal bed mobility: Needs Assistance Bed Mobility: Supine to Sit     Supine to sit: Mod assist;+2 for physical assistance;+2 for safety/equipment     General bed mobility comments: Pt requires assist for trunk and LEs for sitting up with HOB raised. Increased time.  Transfers Overall transfer level: Needs assistance Equipment used: Rolling walker (2 wheeled);None Transfers: Sit to/from [facility] Sit to Stand: Max assist;+2 physical assistance Stand pivot transfers: Total assist;+2 physical assistance       General transfer comment: Cues for technique and anterior weight shift with therapist blocking bil feet due to sliding. Anxious and fearful of falling. Significant posterior lean through hips with narrow BoS. SPT bed to chair with  total A, "i am going to fall"  Ambulation/Gait                 Stairs            Wheelchair Mobility    Modified Rankin (Stroke Patients Only)       Balance Overall balance assessment: Needs assistance Sitting-balance support: Feet supported;No upper extremity supported Sitting balance-Leahy Scale: Fair Sitting balance - Comments: Able to perform there ex sitting EOB without difficulty.    Standing balance support: During functional activity Standing balance-Leahy Scale: Zero Standing balance comment: posterior lean                    Cognition Arousal/Alertness: Awake/alert Behavior During Therapy: WFL for tasks assessed/performed Overall Cognitive Status: No family/caregiver present to determine baseline cognitive functioning                 General Comments: Not a great historian, tangential speech at times.     Exercises General Exercises - Lower Extremity Long Arc Quad: Both;15 reps;Seated Hip Flexion/Marching: Both;15 reps;Seated    General Comments        Pertinent Vitals/Pain Pain Assessment: Faces Faces Pain Scale: No hurt    Home Living                      Prior Function            PT Goals (current goals can now be found in the care plan section) Acute Rehab PT Goals Patient Stated Goal: feel better PT Goal Formulation: With patient Time For Goal Achievement: 09/30/16 Potential to Achieve Goals: Fair  Frequency    Min 2X/week      PT Plan      Co-evaluation             End of Session Equipment Utilized During Treatment: Gait belt;Back brace Activity Tolerance: Patient tolerated treatment well Patient left: in chair;with call bell/phone within reach;with chair alarm set     Time: [id number] PT Time Calculation (min) (ACUTE ONLY): 20 min  Charges:  $Therapeutic Activity: 8-22 mins                    G Codes:      Shataya Winkles A Markiah Janeway 09/16/2016, 12:03 PM Wray Kearns, [place],  DPT [phone]

## 2016-09-16 NOTE — Progress Notes (Signed)
Attempted to call report to [facility] x4. RN will be available until 7:30pm to answer questions.

## 2016-09-16 NOTE — Discharge Instructions (Signed)
Aspiration Pneumonia °Aspiration pneumonia is an infection in your lungs. It occurs when food, liquid, or stomach contents (vomit) are inhaled (aspirated) into your lungs. When these things get into your lungs, swelling (inflammation) and infection can occur. This can make it difficult for you to breathe. Aspiration pneumonia is a serious condition and can be life threatening. °What increases the risk? °Aspiration pneumonia is more likely to occur when a person's cough (gag) reflex or ability to swallow has been decreased. Some things that can do this include: °· Having a brain injury or disease, such as stroke, seizures, Parkinson's disease, dementia, or amyotrophic lateral sclerosis (ALS). °· Being given general anesthetic for procedures. °· Being in a coma (unconscious). °· Having a narrowing of the tube that carries food to the stomach (esophagus). °· Drinking too much alcohol. If a person passes out and vomits, vomit can be swallowed into the lungs. °· Taking certain medicines, such as tranquilizers or sedatives. ° °What are the signs or symptoms? °· Coughing after swallowing food or liquids. °· Breathing problems, such as wheezing or shortness of breath. °· Bluish skin. This can be caused by lack of oxygen. °· Coughing up food or mucus. The mucus might contain blood, greenish material, or yellowish-white fluid (pus). °· Fever. °· Chest pain. °· Being more tired than usual (fatigue). °· Sweating more than usual. °· Bad breath. °How is this diagnosed? °A physical exam will be done. During the exam, the health care provider will listen to your lungs with a stethoscope to check for: °· Crackling sounds in the lungs. °· Decreased breath sounds. °· A rapid heartbeat. ° °Various tests may be ordered. These may include: °· Chest X-ray. °· CT scan. °· Swallowing study. This test looks at how food is swallowed and whether it goes into your breathing tube (trachea) or food pipe (esophagus). °· Sputum culture. Saliva and  mucus (sputum) are collected from the lungs or the tubes that carry air to the lungs (bronchi). The sputum is then tested for bacteria. °· Bronchoscopy. This test uses a flexible tube (bronchoscope) to see inside the lungs. ° °How is this treated? °Treatment will usually include antibiotic medicines. Other medicines may also be used to reduce fever or pain. You may need to be treated in the hospital. In the hospital, your breathing will be carefully monitored. Depending on how well you are breathing, you may need to be given oxygen, or you may need breathing support from a breathing machine (ventilator). For people who fail a swallowing study, a feeding tube might be placed in the stomach, or they may be asked to avoid certain food textures or liquids when they eat. °Follow these instructions at home: °· Carefully follow any special eating instructions you were given, such as avoiding certain food textures or thickening liquids. This reduces the risk of developing aspiration pneumonia again. °· Only take over-the-counter or prescription medicines as directed by your health care provider. Follow the directions carefully. °· If you were prescribed antibiotics, take them as directed. Finish them even if you start to feel better. °· Rest as instructed by your health care provider. °· Keep all follow-up appointments with your health care provider. °Contact a health care provider if: °· You develop worsening shortness of breath, wheezing, or difficulty breathing. °· You develop a fever. °· You have chest pain. °This information is not intended to replace advice given to you by your health care provider. Make sure you discuss any questions you have with your health care   provider. °Document Released: 06/20/2009 Document Revised: 02/04/2016 Document Reviewed: 02/08/2013 °[employer] Interactive Patient Education © 2017 [employer] ° °

## 2016-09-18 LAB — CULTURE, BLOOD (ROUTINE X 2)
CULTURE: NO GROWTH
Culture: NO GROWTH

## 2018-10-22 IMAGING — MR MR ABDOMEN WO/W CM
10 of 19 series · 18 of 48 positions shown · IV contrast (multihance)
Comparison: CT abdomen and pelvis 09/13/2016, 04/05/2014,
11/20/2012

CLINICAL DATA: Acute generalized abdominal pain and vomiting. CT
suggested possible pancreatic lesion.

EXAM:
MRI ABDOMEN WITHOUT AND WITH CONTRAST
TECHNIQUE: Multiplanar multisequence MR imaging of the abdomen was performed
both before and after the administration of intravenous contrast.
CONTRAST:  10mL MULTIHANCE GADOBENATE DIMEGLUMINE 529 MG/ML IV SOLN

[Series 3: T2 · coronal · 5.0mm · 0.68mm/px · 1 of 35 slices shown (1 of 2)]
[im 1/35]
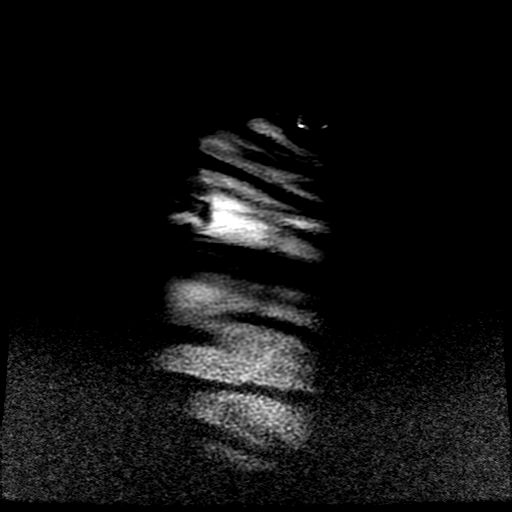

[Series 4: T2 fat-sat · axial · 5.0mm · 0.68mm/px · z∈[-8,+187]mm · 2 of 40 slices shown]
[im 1/40]
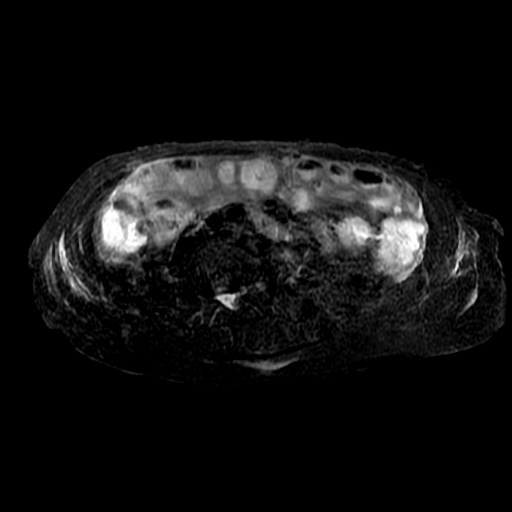
[im 40/40]
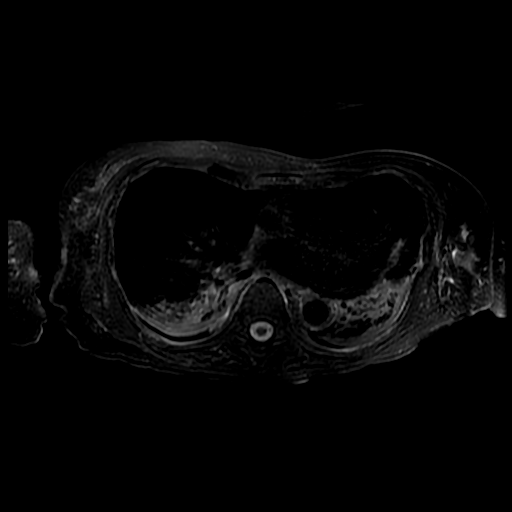

[Series 5: DWI b500 · axial · 6.0mm · 1.37mm/px · z∈[-11,+176]mm · 2 of 50 slices shown]
[im 1/50]
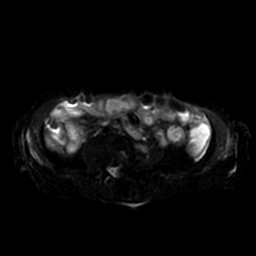
[im 50/50]
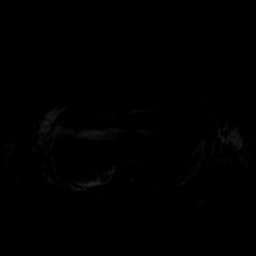

[Series 6: T2 · axial · 5.0mm · 0.68mm/px · z∈[-8,+187]mm · 2 of 40 slices shown (2 of 2)]
[im 1/40]
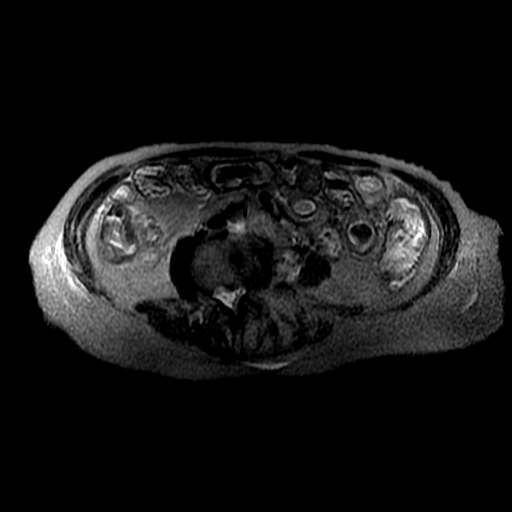
[im 40/40]
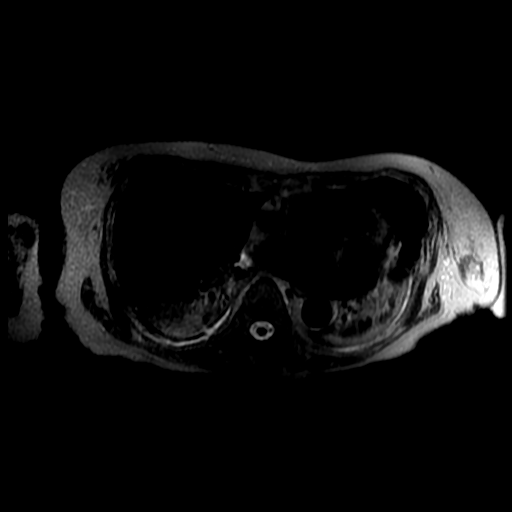

[Series 8: bSSFP · axial · 5.0mm · 0.68mm/px · z∈[-8,+187]mm · 2 of 40 slices shown]
[im 1/40]
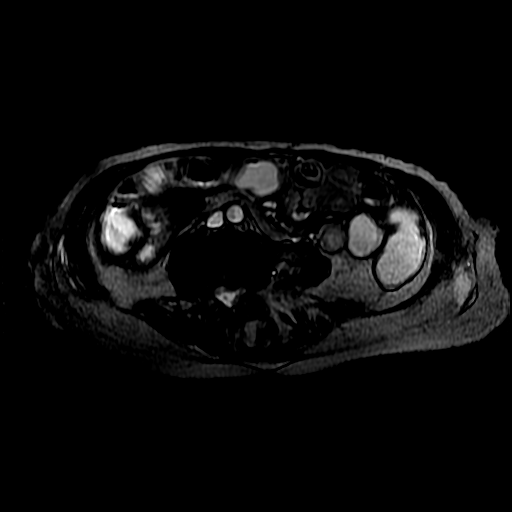
[im 40/40]
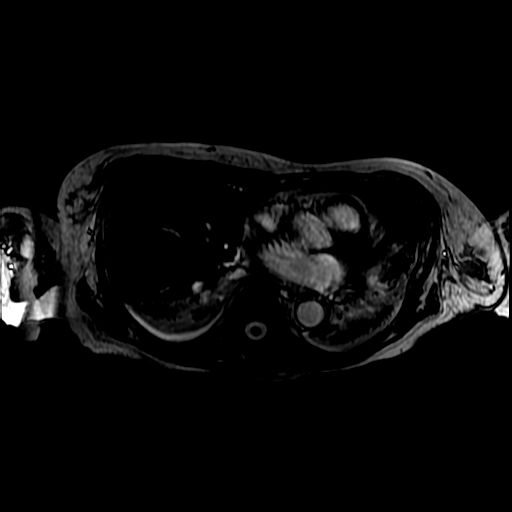

[Series 12: ax dualecho · axial · 5.0mm · 0.68mm/px · z∈[-8,+187]mm · 3 of 80 slices shown]
[im 1/80]
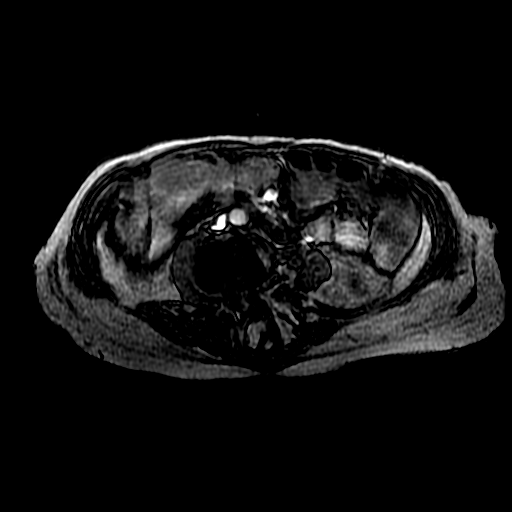
[im 40/80]
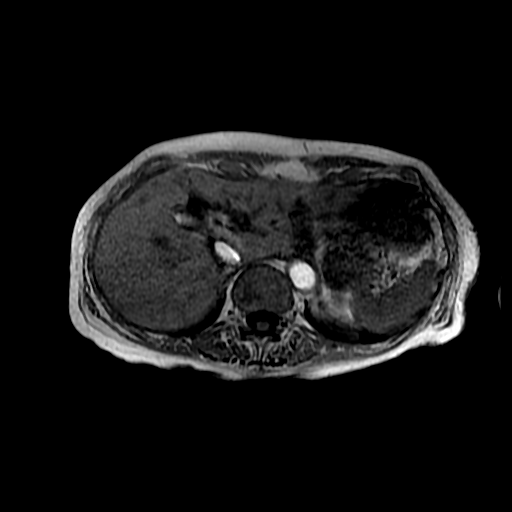
[im 80/80]
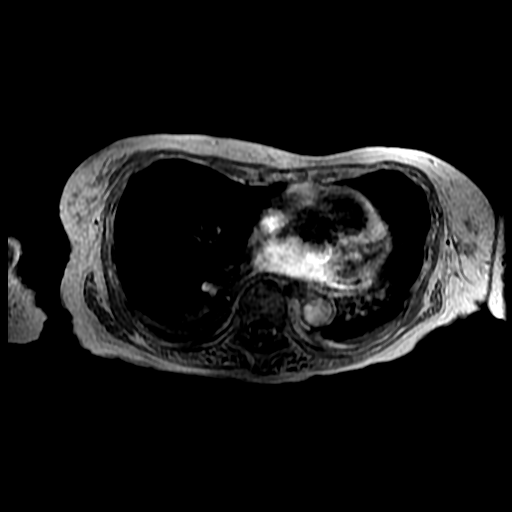

[Series 15: T1 dynamic post-contrast · coronal · 5.0mm · 0.68mm/px · 3 of 71 slices shown]
[im 1/71]
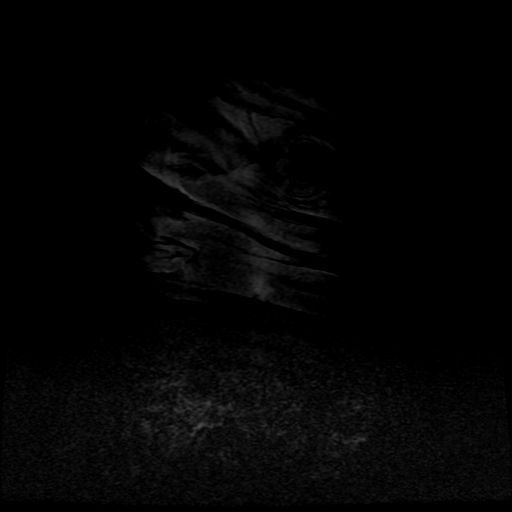
[im 36/71]
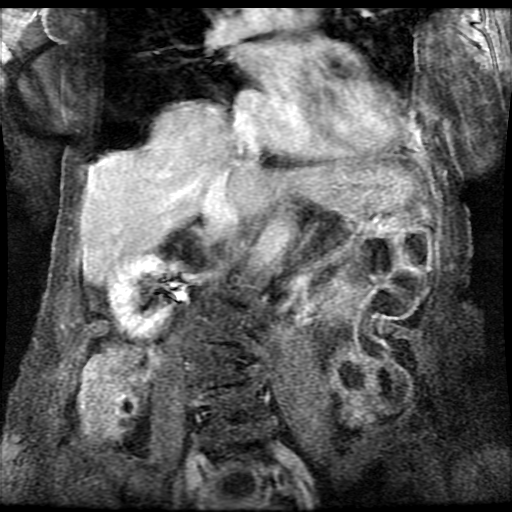
[im 71/71]
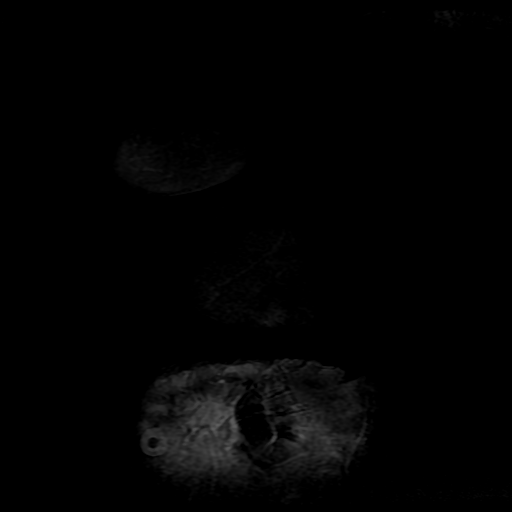

[Series 500: DWI · axial · 6.0mm · 1.37mm/px · 1 of 25 slices shown]
[im 1/25]
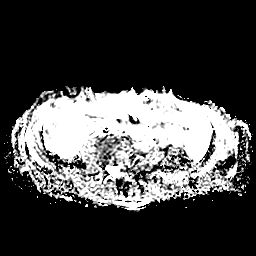

[Series 702: processed images · axial · 1.6mm · 0.34mm/px · 1 of 4 slices shown]
[im 1/4]
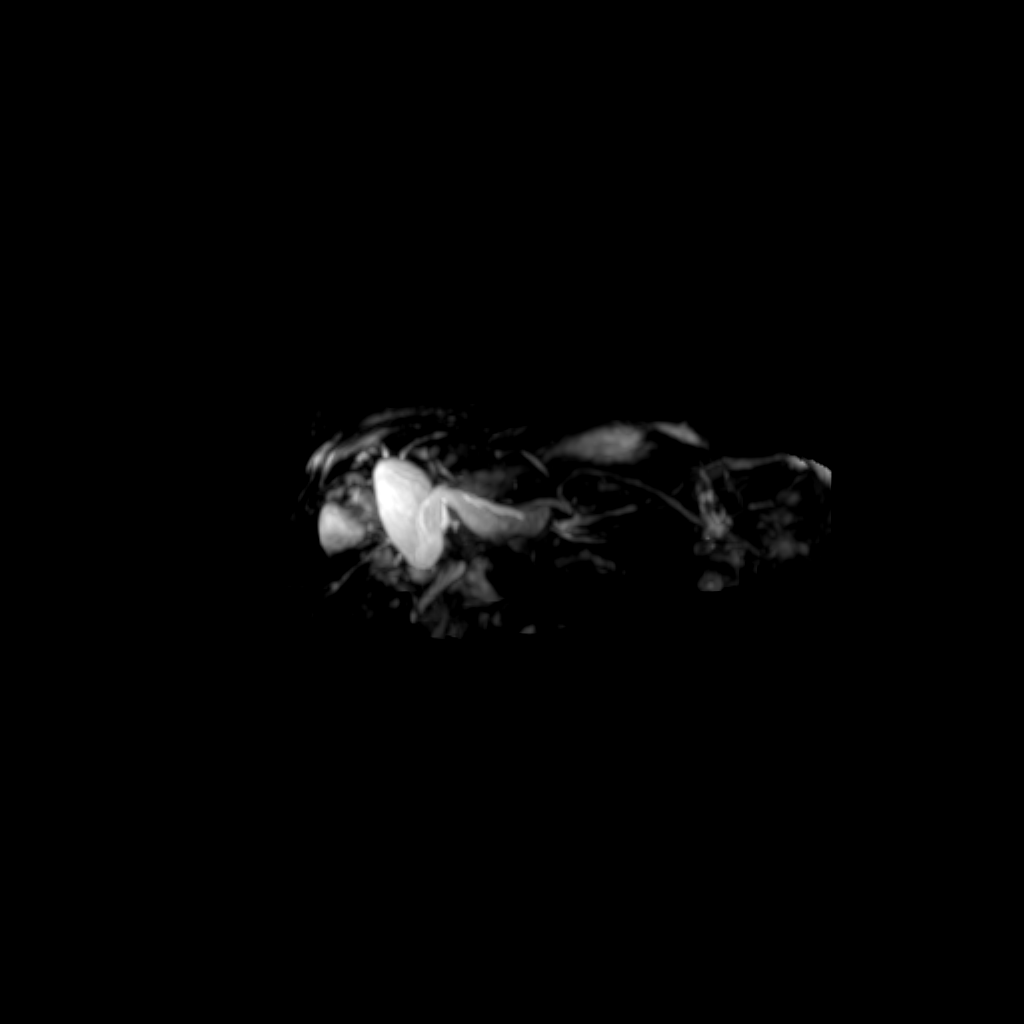

[Series 1400: T1 dynamic · axial · 5.0mm · 0.68mm/px · 1 of 80 slices shown]
[im 1/80]
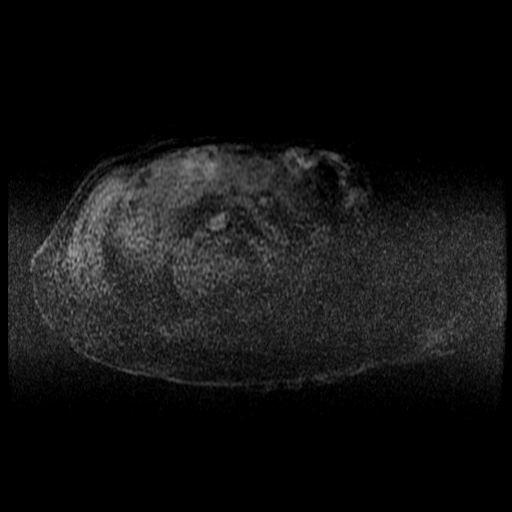

[18 of 48 positions shown; findings below may reference images not displayed]

FINDINGS: Lower chest: Consolidation or atelectasis in both lung bases.
Changes may represent pneumonia. Minimal pleural effusions.

Hepatobiliary: No focal liver lesions. Gallbladder is unremarkable.
Mild extrahepatic bile duct dilatation is likely normal for
patient's age.

Pancreas: Evaluation of the pancreas is limited due to motion
artifact. Pancreatic duct does not appear dilated. No enhancing mass
lesions appreciated. No peripancreatic fluid collection or
infiltration.

Spleen:  Within normal limits in size and appearance.

Adrenals/Urinary Tract: No masses identified. No evidence of
hydronephrosis.

Stomach/Bowel: Visualized portions within the abdomen are
unremarkable.

Vascular/Lymphatic: No pathologically enlarged lymph nodes
identified. No abdominal aortic aneurysm demonstrated.

Other: Examination is technically limited due to motion artifact.
Significant lesions could be obscured to visualization.

Musculoskeletal: Lumbar scoliosis with convex ED towards the right.
Degenerative changes in the visualized lumbar spine. No expansile
bone lesions appreciated. Central disc protrusion or osteophyte
cause effacement of the thecal sac at L1-2. Moderate central canal
stenosis at L2-3 and L3-4.
IMPRESSION: Examination is technically limited by motion artifact. No focal
pancreatic lesion or enhancement is demonstrated. No pancreatic
ductal dilatation. Cannot confirm presence or absence of the lesion
identified at CT.

## 2018-11-05 DEATH — deceased
